# Patient Record
Sex: Female | Born: 1965 | Race: White | Hispanic: No | Marital: Married | Smoking: Never smoker
Health system: Southern US, Community
[De-identification: ages and names within clinical notes are randomized; demographics above are authoritative.]

## PROBLEM LIST (undated history)

## (undated) DIAGNOSIS — R519 Headache, unspecified: Secondary | ICD-10-CM

## (undated) DIAGNOSIS — E119 Type 2 diabetes mellitus without complications: Secondary | ICD-10-CM

## (undated) DIAGNOSIS — Z87898 Personal history of other specified conditions: Secondary | ICD-10-CM

## (undated) DIAGNOSIS — Z9889 Other specified postprocedural states: Secondary | ICD-10-CM

## (undated) DIAGNOSIS — I10 Essential (primary) hypertension: Secondary | ICD-10-CM

## (undated) DIAGNOSIS — K219 Gastro-esophageal reflux disease without esophagitis: Secondary | ICD-10-CM

## (undated) DIAGNOSIS — M1990 Unspecified osteoarthritis, unspecified site: Secondary | ICD-10-CM

## (undated) DIAGNOSIS — Z87442 Personal history of urinary calculi: Secondary | ICD-10-CM

## (undated) DIAGNOSIS — S83206A Unspecified tear of unspecified meniscus, current injury, right knee, initial encounter: Secondary | ICD-10-CM

## (undated) DIAGNOSIS — R112 Nausea with vomiting, unspecified: Secondary | ICD-10-CM

## (undated) HISTORY — PX: TONSILLECTOMY: SHX5217

## (undated) HISTORY — DX: Type 2 diabetes mellitus without complications: E11.9

## (undated) HISTORY — PX: OTHER SURGICAL HISTORY: SHX169

## (undated) HISTORY — DX: Personal history of other specified conditions: Z87.898

## (undated) HISTORY — DX: Essential (primary) hypertension: I10

## (undated) HISTORY — DX: Gastro-esophageal reflux disease without esophagitis: K21.9

## (undated) HISTORY — PX: CARPECTOMY: SHX5004

---

## 1997-07-27 HISTORY — PX: GANGLION CYST EXCISION: SHX1691

## 1998-07-27 HISTORY — PX: KNEE ARTHROSCOPY: SUR90

## 2002-04-22 ENCOUNTER — Inpatient Hospital Stay (HOSPITAL_COMMUNITY): Admission: AD | Admit: 2002-04-22 | Discharge: 2002-04-22 | Payer: Self-pay | Admitting: Gynecology

## 2002-07-03 ENCOUNTER — Ambulatory Visit (HOSPITAL_COMMUNITY): Admission: RE | Admit: 2002-07-03 | Discharge: 2002-07-03 | Payer: Self-pay | Admitting: Gastroenterology

## 2002-08-24 ENCOUNTER — Other Ambulatory Visit: Admission: RE | Admit: 2002-08-24 | Discharge: 2002-08-24 | Payer: Self-pay | Admitting: Obstetrics and Gynecology

## 2002-10-10 ENCOUNTER — Ambulatory Visit (HOSPITAL_COMMUNITY): Admission: RE | Admit: 2002-10-10 | Discharge: 2002-10-10 | Payer: Self-pay | Admitting: Obstetrics and Gynecology

## 2002-10-10 ENCOUNTER — Encounter: Payer: Self-pay | Admitting: Obstetrics and Gynecology

## 2003-02-15 ENCOUNTER — Inpatient Hospital Stay (HOSPITAL_COMMUNITY): Admission: AD | Admit: 2003-02-15 | Discharge: 2003-02-18 | Payer: Self-pay | Admitting: Obstetrics and Gynecology

## 2003-02-19 ENCOUNTER — Encounter: Admission: RE | Admit: 2003-02-19 | Discharge: 2003-03-21 | Payer: Self-pay | Admitting: Obstetrics and Gynecology

## 2003-03-22 ENCOUNTER — Encounter: Admission: RE | Admit: 2003-03-22 | Discharge: 2003-04-21 | Payer: Self-pay | Admitting: Obstetrics and Gynecology

## 2003-04-22 ENCOUNTER — Encounter: Admission: RE | Admit: 2003-04-22 | Discharge: 2003-05-22 | Payer: Self-pay | Admitting: Obstetrics and Gynecology

## 2003-08-27 ENCOUNTER — Other Ambulatory Visit: Admission: RE | Admit: 2003-08-27 | Discharge: 2003-08-27 | Payer: Self-pay | Admitting: Obstetrics and Gynecology

## 2004-07-27 HISTORY — PX: BREAST REDUCTION SURGERY: SHX8

## 2004-09-04 ENCOUNTER — Other Ambulatory Visit: Admission: RE | Admit: 2004-09-04 | Discharge: 2004-09-04 | Payer: Self-pay | Admitting: Obstetrics and Gynecology

## 2004-12-31 ENCOUNTER — Encounter: Admission: RE | Admit: 2004-12-31 | Discharge: 2004-12-31 | Payer: Self-pay | Admitting: Obstetrics and Gynecology

## 2005-07-27 HISTORY — PX: CARPECTOMY: SHX5004

## 2005-09-09 ENCOUNTER — Other Ambulatory Visit: Admission: RE | Admit: 2005-09-09 | Discharge: 2005-09-09 | Payer: Self-pay | Admitting: Obstetrics and Gynecology

## 2007-03-30 ENCOUNTER — Encounter: Admission: RE | Admit: 2007-03-30 | Discharge: 2007-03-30 | Payer: Self-pay | Admitting: Unknown Physician Specialty

## 2007-04-06 ENCOUNTER — Encounter: Admission: RE | Admit: 2007-04-06 | Discharge: 2007-04-06 | Payer: Self-pay | Admitting: Unknown Physician Specialty

## 2007-10-12 ENCOUNTER — Encounter: Admission: RE | Admit: 2007-10-12 | Discharge: 2007-10-12 | Payer: Self-pay | Admitting: Family Medicine

## 2008-05-14 ENCOUNTER — Encounter: Admission: RE | Admit: 2008-05-14 | Discharge: 2008-05-14 | Payer: Self-pay | Admitting: Family Medicine

## 2009-05-20 ENCOUNTER — Encounter: Admission: RE | Admit: 2009-05-20 | Discharge: 2009-05-20 | Payer: Self-pay | Admitting: Family Medicine

## 2010-08-18 ENCOUNTER — Encounter: Payer: Self-pay | Admitting: Unknown Physician Specialty

## 2010-08-18 ENCOUNTER — Encounter: Payer: Self-pay | Admitting: Family Medicine

## 2010-12-12 NOTE — Op Note (Signed)
Jamie Hall, Jamie Hall                          ACCOUNT NO.:  [id number]   MEDICAL RECORD NO.:  [id number]                   PATIENT TYPE:  INP   LOCATION:  9144                                 FACILITY:  [facility]   PHYSICIAN:  Huel Cote, M.D.              DATE OF BIRTH:  [birth year]   DATE OF PROCEDURE:  02/16/2003  DATE OF DISCHARGE:                                 OPERATIVE REPORT   DELIVERY NOTE:  In short, this is a 45 year old female who was admitted with  premature rupture of membranes on February 15, 2003.  She was then observed for  several hours with no spontaneous labor noted at 36-5/7 weeks' gestation.  She was begun on IV Pitocin to stimulate labor and progressed well  throughout the day, reaching complete dilation at approximately 6 p.m.  The  patient began pushing at 6 p.m. and pushed for 2-1/2 hours with good  progress of fetal station from a 0 to a +3 station.  After 2-1/2 hours of  pushing, the vertex was visible at the introitus, approximately a quarter-  size portion of the head was visible without pushing.  Secondary to maternal  exhaustion, the decision was made to proceed with an outlet vacuum delivery  after risks and benefits were discussed with parents.  The vertex delivered  direct OA with one easy pull to the level of the ears and at that point, the  remainder of the head was delivered with a Ritgen maneuver and a turtle sign  was noted immediately.  A nuchal cord x1 was palpated and reduced over the  head, and an additional nurse was called to the room for assistance.  The  McRoberts maneuver was employed and a wood screw and suprapubic pressure.  Attempted to deliver the anterior shoulder with no success.  After  approximately three minutes, the posterior arm was successfully delivered  and the remainder of the female infant followed.  Pediatrics was called to  the room to evaluate the baby, given the shoulder dystocia, and then the  baby appeared floppy  after delivery.  There was initially an excellent heart  rate with the baby, and it responded to bagging with oxygen with Apgars 3,  8, and 9.  Weight was 6 pounds 15 ounces, cord pH was 7.22.  The placenta  delivered spontaneously.  A second degree perineal laceration was repaired  with 2-0 and 3-0 Vicryl with the sphincter also reinforced.  Estimated blood  loss was 450 mL.  At the conclusion of the delivery the baby was breathing  on its own, doing well, and was taken to the observation nursery.  Still had  some decreased tone, so it was not apparent how well the extremities were  moving; however, these were going to be observed closely in the nursery.  Huel Cote, M.D.    KR/MEDQ  D:  02/16/2003  T:  02/18/2003  Job:  [id number]

## 2010-12-12 NOTE — Discharge Summary (Signed)
Jamie Hall, Jamie Hall                            ACCOUNT NO.:  [id number]   MEDICAL RECORD NO.:  [id number]                   PATIENT TYPE:   LOCATION:                                       FACILITY:   PHYSICIAN:  Huel Cote, M.D.              DATE OF BIRTH:   DATE OF ADMISSION:  02/16/2003  DATE OF DISCHARGE:  02/18/2003                                 DISCHARGE SUMMARY   DISCHARGE DIAGNOSES:  1. Term pregnancy at 37 weeks, delivered.  2. Status post vaginal delivery with severe shoulder dystocia.  3. Advanced maternal age.  4. Group B strep carrier.   DISCHARGE MEDICATIONS:  1. Motrin 600 mg p.o. every six hours p.r.n..  2. Percocet 1-2 tablets p.o. every four hours as needed.   DISCHARGE FOLLOWUP:  The patient is to followup in our office in  approximately six weeks for her routine postpartum exam.   HOSPITAL COURSE:  The patient is a 45 year old G3, P0-0-2-0 who was admitted  with spontaneous ruptured membranes on February 16, 2003.  At that time the  patient had no significant contractions.  Prenatal care had been essentially  uncomplicated except for advanced maternal age with the patient declining  amniocentesis and with a history of polycystic ovary disease.   PRENATAL LABS:  A positive antibody negative, RPR nonreactive, Rubella  immune, hepatitis B surface antigen negative, HIV declined, gonorrhea  negative, Chlamydia negative, Group B strep positive, triple screen normal.   PAST OBSTETRICAL HISTORY:  In 2002, she had a miscarriage and in 2003 had a  miscarriage as well.   PAST GYNECOLOGICAL HISTORY:  There is a history of polycystic ovary disease  and some infertility related to that.   PAST MEDICAL HISTORY:  None.   PAST SURGICAL HISTORY:  1. The patient had a tonsillectomy in 1974.  2. In 1999, she had a right ganglion cyst removed.  3. In 2000, she had a right arthroscopy.  4. In 2003, she had a D&E at time of her miscarriage.  5. In 1985, she had a  right knee biopsy.   DRUG ALLERGIES:  PENICILLIN.   MEDICATIONS:  She was on no medications at the time of admission.   PHYSICAL EXAMINATION:  VITAL SIGNS:  She was afebrile with stable vital  signs.  Blood pressure is 105/56.  PELVIC:  Cervix was 1-2-cm, 70% effaced and vertex was at a -1.   HOSPITAL COURSE:  She was begun on Pitocin throughout the day and reached  complete dilation some time that evening.  After reaching complete dilation,  the patient pushed approximately 1-1/2 hours and brought the vertex to the  perineum with a quarter sized portion of the caput showing at the vaginal  area.  Secondary to maternal exhaustion, the decision was made to proceed  with an outlet vacuum delivery, and the vertex was delivered direct OA with  one easy pull up to  the ears and the remainder of the head delivered with a  positive turtle sign noted immediately.  There was a nuchal cord x1 which  reduced.  Additional nursing was called to the room and the baby was  ultimately delivered with McRoberts-Wood screw, suprapubic pressure and a  posterior arm release.  A viable female infant was delivered.  Pediatrics  was called to the room. and the baby responded well.  Apgars were 3, 8 and  9.  Weight was 6 pounds 15 ounces.  Cord pH was 7.22. A second degree  perineal laceration was repaired with 2-0 and 3-0 Vicryl.  Estimated blood  loss was approximately 450 cc.  Baby did well except for some decreased  movement of the right anterior arm which persisted until discharge, although  was improving slowly.  The patient did very well after delivery and remained  afebrile.  On postpartum day number two, she was tolerating her pain well,  had no complaints and the fundus was firm; therefore, she was felt stable  for discharge home and was discharged with medications and followup as  previously stated.                                               Huel Cote, M.D.    KR/MEDQ  D:  03/06/2003   T:  03/06/2003  Job:  [id number]

## 2011-12-30 ENCOUNTER — Other Ambulatory Visit: Payer: Self-pay | Admitting: Obstetrics and Gynecology

## 2011-12-30 DIAGNOSIS — Z1231 Encounter for screening mammogram for malignant neoplasm of breast: Secondary | ICD-10-CM

## 2012-02-04 ENCOUNTER — Ambulatory Visit: Payer: Self-pay

## 2012-02-08 ENCOUNTER — Ambulatory Visit
Admission: RE | Admit: 2012-02-08 | Discharge: 2012-02-08 | Disposition: A | Discharge status: Home / Self Care | Payer: No Typology Code available for payment source | Source: Ambulatory Visit | Attending: Obstetrics and Gynecology | Admitting: Obstetrics and Gynecology

## 2012-02-08 ENCOUNTER — Other Ambulatory Visit: Payer: Self-pay | Admitting: Obstetrics and Gynecology

## 2012-02-08 DIAGNOSIS — Z1231 Encounter for screening mammogram for malignant neoplasm of breast: Secondary | ICD-10-CM

## 2012-02-18 DIAGNOSIS — C4491 Basal cell carcinoma of skin, unspecified: Secondary | ICD-10-CM | POA: Insufficient documentation

## 2015-08-26 DIAGNOSIS — H5213 Myopia, bilateral: Secondary | ICD-10-CM | POA: Diagnosis not present

## 2015-09-06 DIAGNOSIS — R3121 Asymptomatic microscopic hematuria: Secondary | ICD-10-CM | POA: Diagnosis not present

## 2015-09-06 DIAGNOSIS — Z0000 Encounter for general adult medical examination without abnormal findings: Secondary | ICD-10-CM | POA: Diagnosis not present

## 2015-10-11 ENCOUNTER — Other Ambulatory Visit (HOSPITAL_COMMUNITY): Payer: Self-pay | Admitting: Urology

## 2015-10-11 DIAGNOSIS — R3121 Asymptomatic microscopic hematuria: Secondary | ICD-10-CM

## 2015-10-15 DIAGNOSIS — J111 Influenza due to unidentified influenza virus with other respiratory manifestations: Secondary | ICD-10-CM | POA: Diagnosis not present

## 2015-10-16 ENCOUNTER — Ambulatory Visit (HOSPITAL_COMMUNITY): Payer: 59

## 2015-10-23 ENCOUNTER — Ambulatory Visit (HOSPITAL_COMMUNITY)
Admission: RE | Admit: 2015-10-23 | Discharge: 2015-10-23 | Disposition: A | Discharge status: Home / Self Care | Payer: 59 | Source: Ambulatory Visit | Attending: Urology | Admitting: Urology

## 2015-10-23 ENCOUNTER — Encounter (HOSPITAL_COMMUNITY): Payer: Self-pay

## 2015-10-23 DIAGNOSIS — N200 Calculus of kidney: Secondary | ICD-10-CM | POA: Insufficient documentation

## 2015-10-23 DIAGNOSIS — N281 Cyst of kidney, acquired: Secondary | ICD-10-CM | POA: Diagnosis not present

## 2015-10-23 DIAGNOSIS — R3121 Asymptomatic microscopic hematuria: Secondary | ICD-10-CM | POA: Diagnosis not present

## 2015-10-23 MED ORDER — IOPAMIDOL (ISOVUE-300) INJECTION 61%
100.0000 mL | Freq: Once | INTRAVENOUS | Status: AC | PRN
  Administered 2015-10-23: 100 mL via INTRAVENOUS

## 2016-06-25 DIAGNOSIS — Z1231 Encounter for screening mammogram for malignant neoplasm of breast: Secondary | ICD-10-CM | POA: Diagnosis not present

## 2016-06-25 DIAGNOSIS — Z1389 Encounter for screening for other disorder: Secondary | ICD-10-CM | POA: Diagnosis not present

## 2016-06-25 DIAGNOSIS — Z975 Presence of (intrauterine) contraceptive device: Secondary | ICD-10-CM | POA: Diagnosis not present

## 2016-06-25 DIAGNOSIS — Z01419 Encounter for gynecological examination (general) (routine) without abnormal findings: Secondary | ICD-10-CM | POA: Diagnosis not present

## 2016-06-25 DIAGNOSIS — Z130 Encounter for screening for diseases of the blood and blood-forming organs and certain disorders involving the immune mechanism: Secondary | ICD-10-CM | POA: Diagnosis not present

## 2016-06-25 DIAGNOSIS — Z6841 Body Mass Index (BMI) 40.0 and over, adult: Secondary | ICD-10-CM | POA: Diagnosis not present

## 2017-06-19 LAB — HM MAMMOGRAPHY: HM Mammogram: NORMAL (ref 0–4)

## 2017-06-19 LAB — HM PAP SMEAR

## 2017-07-08 DIAGNOSIS — Z130 Encounter for screening for diseases of the blood and blood-forming organs and certain disorders involving the immune mechanism: Secondary | ICD-10-CM | POA: Diagnosis not present

## 2017-07-08 DIAGNOSIS — Z975 Presence of (intrauterine) contraceptive device: Secondary | ICD-10-CM | POA: Diagnosis not present

## 2017-07-08 DIAGNOSIS — Z124 Encounter for screening for malignant neoplasm of cervix: Secondary | ICD-10-CM | POA: Diagnosis not present

## 2017-07-08 DIAGNOSIS — Z1151 Encounter for screening for human papillomavirus (HPV): Secondary | ICD-10-CM | POA: Diagnosis not present

## 2017-07-08 DIAGNOSIS — Z1231 Encounter for screening mammogram for malignant neoplasm of breast: Secondary | ICD-10-CM | POA: Diagnosis not present

## 2017-07-08 DIAGNOSIS — Z1389 Encounter for screening for other disorder: Secondary | ICD-10-CM | POA: Diagnosis not present

## 2017-07-08 DIAGNOSIS — Z01419 Encounter for gynecological examination (general) (routine) without abnormal findings: Secondary | ICD-10-CM | POA: Diagnosis not present

## 2017-07-09 DIAGNOSIS — Z124 Encounter for screening for malignant neoplasm of cervix: Secondary | ICD-10-CM | POA: Diagnosis not present

## 2017-11-03 DIAGNOSIS — G8929 Other chronic pain: Secondary | ICD-10-CM | POA: Diagnosis not present

## 2017-11-03 DIAGNOSIS — L02232 Carbuncle of back [any part, except buttock]: Secondary | ICD-10-CM | POA: Diagnosis not present

## 2017-11-04 DIAGNOSIS — G8929 Other chronic pain: Secondary | ICD-10-CM | POA: Diagnosis not present

## 2017-11-04 DIAGNOSIS — L02232 Carbuncle of back [any part, except buttock]: Secondary | ICD-10-CM | POA: Diagnosis not present

## 2017-12-15 ENCOUNTER — Telehealth: Payer: Self-pay | Admitting: *Deleted

## 2017-12-15 NOTE — Telephone Encounter (Signed)
Patient has been scheduled for colonoscopy on 02/01/18 at 10:00 am in [facility] She is also scheduled for previsit on 01/19/18 at 3:30 pm. Patient verbalizes understanding of this and has been sent paperwork with these dates and times.

## 2017-12-15 NOTE — Telephone Encounter (Signed)
-----   Message from Jerene Bears, MD sent at 12/14/2017 10:47 AM EDT ----- Regarding: Screening colon [place] needs her screening colon after 01/06/2018; avg risk Please contact her to arrange in [facility] Thanks [place]

## 2018-01-17 ENCOUNTER — Telehealth: Payer: Self-pay | Admitting: Family Medicine

## 2018-01-17 ENCOUNTER — Encounter: Payer: Self-pay | Admitting: Family Medicine

## 2018-01-17 ENCOUNTER — Ambulatory Visit (INDEPENDENT_AMBULATORY_CARE_PROVIDER_SITE_OTHER): Payer: 59 | Admitting: Family Medicine

## 2018-01-17 ENCOUNTER — Other Ambulatory Visit: Payer: Self-pay

## 2018-01-17 DIAGNOSIS — G44009 Cluster headache syndrome, unspecified, not intractable: Secondary | ICD-10-CM | POA: Insufficient documentation

## 2018-01-17 DIAGNOSIS — I10 Essential (primary) hypertension: Secondary | ICD-10-CM | POA: Diagnosis not present

## 2018-01-17 DIAGNOSIS — E119 Type 2 diabetes mellitus without complications: Secondary | ICD-10-CM | POA: Insufficient documentation

## 2018-01-17 DIAGNOSIS — R7303 Prediabetes: Secondary | ICD-10-CM | POA: Diagnosis not present

## 2018-01-17 DIAGNOSIS — G44019 Episodic cluster headache, not intractable: Secondary | ICD-10-CM | POA: Diagnosis not present

## 2018-01-17 LAB — HEPATIC FUNCTION PANEL
ALT: 24 U/L (ref 0–35)
AST: 16 U/L (ref 0–37)
Albumin: 4.1 g/dL (ref 3.5–5.2)
Alkaline Phosphatase: 66 U/L (ref 39–117)
Bilirubin, Direct: 0.1 mg/dL (ref 0.0–0.3)
TOTAL PROTEIN: 6.7 g/dL (ref 6.0–8.3)
Total Bilirubin: 0.2 mg/dL (ref 0.2–1.2)

## 2018-01-17 LAB — LIPID PANEL
CHOLESTEROL: 184 mg/dL (ref 0–200)
HDL: 43.2 mg/dL (ref >39.00)
LDL Cholesterol: 115 mg/dL — ABNORMAL HIGH (ref 0–99)
NonHDL: 140.89
Total CHOL/HDL Ratio: 4
Triglycerides: 129 mg/dL (ref 0.0–149.0)
VLDL: 25.8 mg/dL (ref 0.0–40.0)

## 2018-01-17 LAB — CBC WITH DIFFERENTIAL/PLATELET
Basophils Absolute: 0.1 10*3/uL (ref 0.0–0.1)
Basophils Relative: 0.7 % (ref 0.0–3.0)
EOS ABS: 0.3 10*3/uL (ref 0.0–0.7)
Eosinophils Relative: 2 % (ref 0.0–5.0)
HEMATOCRIT: 38.8 % (ref 36.0–46.0)
Hemoglobin: 12.9 g/dL (ref 12.0–15.0)
LYMPHS PCT: 21.1 % (ref 12.0–46.0)
Lymphs Abs: 2.7 10*3/uL (ref 0.7–4.0)
MCHC: 33.3 g/dL (ref 30.0–36.0)
MCV: 86.1 fl (ref 78.0–100.0)
Monocytes Absolute: 1 10*3/uL (ref 0.1–1.0)
Monocytes Relative: 7.9 % (ref 3.0–12.0)
Neutro Abs: 8.6 10*3/uL — ABNORMAL HIGH (ref 1.4–7.7)
Neutrophils Relative %: 68.3 % (ref 43.0–77.0)
Platelets: 361 10*3/uL (ref 150.0–400.0)
RBC: 4.5 Mil/uL (ref 3.87–5.11)
RDW: 15 % (ref 11.5–15.5)
WBC: 12.6 10*3/uL — ABNORMAL HIGH (ref 4.0–10.5)

## 2018-01-17 LAB — BASIC METABOLIC PANEL
BUN: 11 mg/dL (ref 6–23)
CALCIUM: 9.6 mg/dL (ref 8.4–10.5)
CO2: 31 meq/L (ref 19–32)
CREATININE: 0.8 mg/dL (ref 0.40–1.20)
Chloride: 102 mEq/L (ref 96–112)
GFR: 80.69 mL/min (ref >60.00)
Glucose, Bld: 95 mg/dL (ref 70–99)
Potassium: 3.9 mEq/L (ref 3.5–5.1)
Sodium: 140 mEq/L (ref 135–145)

## 2018-01-17 LAB — TSH: TSH: 1.74 u[IU]/mL (ref 0.35–4.50)

## 2018-01-17 LAB — HEMOGLOBIN A1C: HEMOGLOBIN A1C: 7 % — AB (ref 4.6–6.5)

## 2018-01-17 MED ORDER — SCOPOLAMINE 1 MG/3DAYS TD PT72: 1.0000 | MEDICATED_PATCH | TRANSDERMAL | 0 refills | Status: DC

## 2018-01-17 MED ORDER — LISINOPRIL 10 MG PO TABS: 10.0000 mg | ORAL_TABLET | Freq: Every day | ORAL | 3 refills | Status: DC

## 2018-01-17 MED ORDER — NAFTIFINE HCL 2 % EX CREA: TOPICAL_CREAM | CUTANEOUS | 3 refills | Status: DC

## 2018-01-17 MED ORDER — OMEPRAZOLE 20 MG PO CPDR: 20.0000 mg | DELAYED_RELEASE_CAPSULE | Freq: Every day | ORAL | 1 refills | Status: DC

## 2018-01-17 MED ORDER — TRIAMCINOLONE ACETONIDE 0.1 % EX OINT: 1.0000 "application " | TOPICAL_OINTMENT | Freq: Two times a day (BID) | CUTANEOUS | 1 refills | Status: DC

## 2018-01-17 MED FILL — LISINOPRIL 10 MG TABLET: 10 | 90 days supply | Qty: 90 | Fill #0

## 2018-01-17 MED FILL — TRIAMCINOLONE 0.1% OINTMENT: 0.1 | 20 days supply | Qty: 90 | Fill #0

## 2018-01-17 MED FILL — TRANSDERM-SCOP 1.5 MG/72HR: 1 | 30 days supply | Qty: 10 | Fill #0

## 2018-01-17 MED FILL — OMEPRAZOLE 20 MG CAP: 20 | 90 days supply | Qty: 90 | Fill #0

## 2018-01-17 NOTE — Telephone Encounter (Signed)
Ok to send script to [facility] pharmacy

## 2018-01-17 NOTE — Assessment & Plan Note (Signed)
New to provider, ongoing for pt for ~15 yrs.  Discussed need to lower BP and while she works on [employer] and regular exercise, we can accomplish this by restarting the Lisinopril that she took a few years ago.  Pt expressed understanding and is in agreement w/ plan.

## 2018-01-17 NOTE — Assessment & Plan Note (Signed)
New to provider, ongoing for pt.  She is not interested in seeing Neurology at this time.  Will follow.

## 2018-01-17 NOTE — Telephone Encounter (Signed)
Medication filled to pharmacy as requested.   

## 2018-01-17 NOTE — Patient Instructions (Signed)
Follow up in 4-6 weeks to recheck BP We'll notify you of your lab results and make any changes if needed RESTART the Lisinopril once daily Try and limit your carbohydrates and processed foods Set a goal to be active for 30 minutes each day Call with any questions or concerns Welcome!  We're glad to have you!

## 2018-01-17 NOTE — Progress Notes (Signed)
   Subjective:    Patient ID: Jamie Hall, female    DOB: 1965/09/03, 52 y.o.   MRN: [id number]  HPI New to establish.  Previous MD- [facility]  OB/GYN- [facility]  HTN- ongoing issue.  Pt reports it has been borderline 'for 15 yrs'.  Was started on Lisinopril some years ago but never got this refilled.  No side effects at that time.  No CP, SOB.  + HAs.  No visual changes.  No abd pain, N/V, edema.  Pre-diabetes- pt reports she had A1C of 6.1 'several years ago'.  Gets yearly eye exams (has contacts).  Obesity- pt's BMI is 41.7.  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  No particular diet- 'convenience'.  Prefers to snack all day rather than eat meals.  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  No relief w/ ibuprofen.  sxs tend to resolve on their own- 'I wait it out'.  Pt will develop nasal congestion during the first hr of headaches.  Pt doesn't feel that it happens enough to see Neuro- occurring q6 weeks but will be triggered by cruises.   Review of Systems For ROS see HPI     Objective:   Physical Exam  Constitutional: She is oriented to person, place, and time. She appears well-developed and well-nourished. No distress.  obese  HENT:  Head: Normocephalic and atraumatic.  Eyes: Pupils are equal, round, and reactive to light. Conjunctivae and EOM are normal.  Neck: Normal range of motion. Neck supple. No thyromegaly present.  Cardiovascular: Normal rate, regular rhythm, normal heart sounds and intact distal pulses.  No murmur heard. Pulmonary/Chest: Effort normal and breath sounds normal. No respiratory distress.  Abdominal: Soft. She exhibits no distension. There is no tenderness.  Musculoskeletal: She exhibits no edema.  Lymphadenopathy:    She has no cervical adenopathy.  Neurological: She is alert and oriented to person, place, and time.  Skin: Skin is warm and dry.  Psychiatric: She has a  normal mood and affect. Her behavior is normal.  Vitals reviewed.         Assessment & Plan:

## 2018-01-17 NOTE — Telephone Encounter (Signed)
Ok to send in prilosec?

## 2018-01-17 NOTE — Assessment & Plan Note (Signed)
Pt's BMI is 41.7- falling in the morbidly obese range.  Stressed need for healthy diet and regular exercise.  Check labs to risk stratify.  Will follow.

## 2018-01-17 NOTE — Assessment & Plan Note (Signed)
Pt has hx of elevated A1C (6.1) a few years ago.  She admits to poor dietary habits and little exercise since then.  I stressed the need for both.  Check labs to risk stratify and adjust tx plan prn.  Pt expressed understanding and is in agreement w/ plan.

## 2018-01-17 NOTE — Addendum Note (Signed)
Addended by: Davis Gourd on: 01/17/2018 12:11 PM   Modules accepted: Orders

## 2018-01-17 NOTE — Telephone Encounter (Signed)
Pt states she would also like prilosec called into [facility] as well.

## 2018-01-18 ENCOUNTER — Other Ambulatory Visit: Payer: Self-pay | Admitting: General Practice

## 2018-01-18 DIAGNOSIS — D72829 Elevated white blood cell count, unspecified: Secondary | ICD-10-CM

## 2018-01-18 MED ORDER — METFORMIN HCL 500 MG PO TABS: 500.0000 mg | ORAL_TABLET | Freq: Two times a day (BID) | ORAL | 3 refills | Status: DC

## 2018-01-18 MED FILL — metFORMIN HCL 500 MG TABS: 500 | 30 days supply | Qty: 60 | Fill #0

## 2018-01-19 ENCOUNTER — Ambulatory Visit (AMBULATORY_SURGERY_CENTER): Payer: Self-pay

## 2018-01-19 ENCOUNTER — Other Ambulatory Visit: Payer: Self-pay

## 2018-01-19 VITALS — Ht 63.0 in | Wt 241.8 lb

## 2018-01-19 DIAGNOSIS — Z1211 Encounter for screening for malignant neoplasm of colon: Secondary | ICD-10-CM

## 2018-01-19 MED ORDER — NA SULFATE-K SULFATE-MG SULF 17.5-3.13-1.6 GM/177ML PO SOLN: 1.0000 | Freq: Once | ORAL | 0 refills | Status: AC

## 2018-01-19 MED FILL — SUPREP BOWEL PREP KIT: 17.5-3.13-1 | 2 days supply | Qty: 354 | Fill #0

## 2018-01-19 NOTE — Progress Notes (Signed)
Denies allergies to eggs or soy products. Denies complication of anesthesia or sedation. Denies use of weight loss medication. Denies use of O2.   Emmi instructions declined.  

## 2018-01-21 ENCOUNTER — Encounter: Payer: Self-pay | Admitting: Internal Medicine

## 2018-02-01 ENCOUNTER — Encounter: Payer: Self-pay | Admitting: Internal Medicine

## 2018-02-01 ENCOUNTER — Ambulatory Visit (AMBULATORY_SURGERY_CENTER): Payer: 59 | Admitting: Internal Medicine

## 2018-02-01 VITALS — BP 154/85 | HR 75 | Temp 98.2°F | Resp 19 | Ht 63.0 in | Wt 237.0 lb

## 2018-02-01 DIAGNOSIS — Z1211 Encounter for screening for malignant neoplasm of colon: Secondary | ICD-10-CM | POA: Diagnosis present

## 2018-02-01 DIAGNOSIS — D123 Benign neoplasm of transverse colon: Secondary | ICD-10-CM | POA: Diagnosis not present

## 2018-02-01 DIAGNOSIS — I10 Essential (primary) hypertension: Secondary | ICD-10-CM | POA: Diagnosis not present

## 2018-02-01 DIAGNOSIS — K635 Polyp of colon: Secondary | ICD-10-CM

## 2018-02-01 DIAGNOSIS — D122 Benign neoplasm of ascending colon: Secondary | ICD-10-CM

## 2018-02-01 DIAGNOSIS — E119 Type 2 diabetes mellitus without complications: Secondary | ICD-10-CM | POA: Diagnosis not present

## 2018-02-01 DIAGNOSIS — K219 Gastro-esophageal reflux disease without esophagitis: Secondary | ICD-10-CM | POA: Diagnosis not present

## 2018-02-01 MED ORDER — SODIUM CHLORIDE 0.9 % IV SOLN: 500.0000 mL | Freq: Once | INTRAVENOUS | Status: DC

## 2018-02-01 NOTE — Patient Instructions (Signed)
YOU HAD AN ENDOSCOPIC PROCEDURE TODAY AT [facility]:   Refer to the procedure report that was given to you for any specific questions about what was found during the examination.  If the procedure report does not answer your questions, please call your gastroenterologist to clarify.  If you requested that your care partner not be given the details of your procedure findings, then the procedure report has been included in a sealed envelope for you to review at your convenience later.  YOU SHOULD EXPECT: Some feelings of bloating in the abdomen. Passage of more gas than usual.  Walking can help get rid of the air that was put into your GI tract during the procedure and reduce the bloating. If you had a lower endoscopy (such as a colonoscopy or flexible sigmoidoscopy) you may notice spotting of blood in your stool or on the toilet paper. If you underwent a bowel prep for your procedure, you may not have a normal bowel movement for a few days.  Please Note:  You might notice some irritation and congestion in your nose or some drainage.  This is from the oxygen used during your procedure.  There is no need for concern and it should clear up in a day or so.  SYMPTOMS TO REPORT IMMEDIATELY:   Following lower endoscopy (colonoscopy or flexible sigmoidoscopy):  Excessive amounts of blood in the stool  Significant tenderness or worsening of abdominal pains  Swelling of the abdomen that is new, acute  Fever of 100F or higher   For urgent or emergent issues, a gastroenterologist can be reached at any hour by calling [phone].   DIET:  We do recommend a small meal at first, but then you may proceed to your regular diet.  Drink plenty of fluids but you should avoid alcoholic beverages for 24 hours.  ACTIVITY:  You should plan to take it easy for the rest of today and you should NOT DRIVE or use heavy machinery until tomorrow (because of the sedation medicines used during the test).     FOLLOW UP: Our staff will call the number listed on your records the next business day following your procedure to check on you and address any questions or concerns that you may have regarding the information given to you following your procedure. If we do not reach you, we will leave a message.  However, if you are feeling well and you are not experiencing any problems, there is no need to return our call.  We will assume that you have returned to your regular daily activities without incident.  If any biopsies were taken you will be contacted by phone or by letter within the next 1-3 weeks.  Please call us at [phone] if you have not heard about the biopsies in 3 weeks.    SIGNATURES/CONFIDENTIALITY: You and/or your care partner have signed paperwork which will be entered into your electronic medical record.  These signatures attest to the fact that that the information above on your After Visit Summary has been reviewed and is understood.  Full responsibility of the confidentiality of this discharge information lies with you and/or your care-partner.    Handouts were given to your care partner on polyps and diverticulosis. You may resume your current medications today. Await biopsy results. Your blood sugar was 125 in the recovery room. Please call if any questions or concerns.

## 2018-02-01 NOTE — Progress Notes (Signed)
Called to room to assist during endoscopic procedure.  Patient ID and intended procedure confirmed with present staff. Received instructions for my participation in the procedure from the performing physician.  

## 2018-02-01 NOTE — Progress Notes (Signed)
No problems noted in the recovery room. maw 

## 2018-02-01 NOTE — Progress Notes (Signed)
I have reviewed the patient's medical history in detail and updated the computerized patient record.

## 2018-02-01 NOTE — Op Note (Signed)
[place] Patient Name: Jamie Hall Procedure Date: 02/01/2018 9:56 AM MRN: [id number] Endoscopist: Jerene Bears , MD Age: 52 Referring MD:  Date of Birth: 06/07/1966 Gender: Female Account #: [id number] Procedure:                Colonoscopy Indications:              Screening for colorectal malignant neoplasm, This                            is the patient's first colonoscopy Medicines:                Monitored Anesthesia Care Procedure:                Pre-Anesthesia Assessment:                           - Prior to the procedure, a History and Physical                            was performed, and patient medications and                            allergies were reviewed. The patient's tolerance of                            previous anesthesia was also reviewed. The risks                            and benefits of the procedure and the sedation                            options and risks were discussed with the patient.                            All questions were answered, and informed consent                            was obtained. Prior Anticoagulants: The patient has                            taken no previous anticoagulant or antiplatelet                            agents. ASA Grade Assessment: II - A patient with                            mild systemic disease. After reviewing the risks                            and benefits, the patient was deemed in                            satisfactory condition to undergo the procedure.  After obtaining informed consent, the colonoscope                            was passed under direct vision. Throughout the                            procedure, the patient's blood pressure, pulse, and                            oxygen saturations were monitored continuously. The                            Colonoscope was introduced through the anus and                            advanced to the terminal ileum.  The colonoscopy was                            performed without difficulty. The patient tolerated                            the procedure well. The quality of the bowel                            preparation was good. The terminal ileum, ileocecal                            valve, appendiceal orifice, and rectum were                            photographed. Scope In: 10:09:56 AM Scope Out: 10:29:10 AM Scope Withdrawal Time: 0 hours 15 minutes 59 seconds  Total Procedure Duration: 0 hours 19 minutes 14 seconds  Findings:                 The digital rectal exam was normal.                           The terminal ileum appeared normal.                           A 5 mm polyp was found in the ascending colon. The                            polyp was sessile. The polyp was removed with a                            cold snare. Resection and retrieval were complete.                           Two sessile polyps were found in the hepatic                            flexure. The polyps were 2 to 3 mm in size.  These                            polyps were removed with a cold snare. Resection                            and retrieval were complete.                           A 2 mm polyp was found in the transverse colon. The                            polyp was sessile. The polyp was removed with a                            cold snare. Resection and retrieval were complete.                           A single small angioectasia without bleeding was                            found in the transverse colon.                           A few small-mouthed diverticula were found in the                            sigmoid colon.                           The retroflexed view of the distal rectum and anal                            verge was normal and showed no anal or rectal                            abnormalities. Complications:            No immediate complications. Estimated Blood Loss:     Estimated  blood loss was minimal. Impression:               - The examined portion of the ileum was normal.                           - One 5 mm polyp in the ascending colon, removed                            with a cold snare. Resected and retrieved.                           - Two 2 to 3 mm polyps at the hepatic flexure,                            removed with a cold snare. Resected and retrieved.                           -  One 2 mm polyp in the transverse colon, removed                            with a cold snare. Resected and retrieved.                           - A single non-bleeding colonic angioectasia.                           - Diverticulosis in the sigmoid colon.                           - The distal rectum and anal verge are normal on                            retroflexion view. Recommendation:           - Patient has a contact number available for                            emergencies. The signs and symptoms of potential                            delayed complications were discussed with the                            patient. Return to normal activities tomorrow.                            Written discharge instructions were provided to the                            patient.                           - Resume previous diet.                           - Continue present medications.                           - Await pathology results.                           - Repeat colonoscopy is recommended. The                            colonoscopy date will be determined after pathology                            results from today's exam become available for                            review. Jerene Bears, MD 02/01/2018 10:34:35 AM This report has been signed electronically.

## 2018-02-01 NOTE — Progress Notes (Signed)
Report to PACU, RN, vss, BBS= Clear.  

## 2018-02-02 ENCOUNTER — Telehealth: Payer: Self-pay

## 2018-02-02 ENCOUNTER — Telehealth: Payer: Self-pay | Admitting: *Deleted

## 2018-02-02 NOTE — Telephone Encounter (Signed)
Left message on answering machine. 

## 2018-02-02 NOTE — Telephone Encounter (Signed)
No answer, second call.  Left message to call if questions or concerns. 

## 2018-02-04 ENCOUNTER — Encounter: Payer: Self-pay | Admitting: Internal Medicine

## 2018-02-28 ENCOUNTER — Ambulatory Visit (INDEPENDENT_AMBULATORY_CARE_PROVIDER_SITE_OTHER): Payer: 59 | Admitting: Family Medicine

## 2018-02-28 ENCOUNTER — Other Ambulatory Visit: Payer: Self-pay

## 2018-02-28 ENCOUNTER — Encounter: Payer: Self-pay | Admitting: Family Medicine

## 2018-02-28 VITALS — BP 128/86 | HR 73 | Temp 98.8°F | Resp 16 | Ht 63.25 in | Wt 236.2 lb

## 2018-02-28 DIAGNOSIS — D72829 Elevated white blood cell count, unspecified: Secondary | ICD-10-CM

## 2018-02-28 DIAGNOSIS — E119 Type 2 diabetes mellitus without complications: Secondary | ICD-10-CM | POA: Diagnosis not present

## 2018-02-28 DIAGNOSIS — I10 Essential (primary) hypertension: Secondary | ICD-10-CM

## 2018-02-28 LAB — CBC WITH DIFFERENTIAL/PLATELET
BASOS PCT: 0.4 % (ref 0.0–3.0)
Basophils Absolute: 0.1 10*3/uL (ref 0.0–0.1)
EOS PCT: 1.9 % (ref 0.0–5.0)
Eosinophils Absolute: 0.2 10*3/uL (ref 0.0–0.7)
HEMATOCRIT: 38.4 % (ref 36.0–46.0)
HEMOGLOBIN: 12.7 g/dL (ref 12.0–15.0)
LYMPHS PCT: 19 % (ref 12.0–46.0)
Lymphs Abs: 2.3 10*3/uL (ref 0.7–4.0)
MCHC: 33.1 g/dL (ref 30.0–36.0)
MCV: 86.9 fl (ref 78.0–100.0)
Monocytes Absolute: 0.8 10*3/uL (ref 0.1–1.0)
Monocytes Relative: 6.5 % (ref 3.0–12.0)
NEUTROS ABS: 8.9 10*3/uL — AB (ref 1.4–7.7)
Neutrophils Relative %: 72.2 % (ref 43.0–77.0)
Platelets: 341 10*3/uL (ref 150.0–400.0)
RBC: 4.42 Mil/uL (ref 3.87–5.11)
RDW: 14.8 % (ref 11.5–15.5)
WBC: 12.3 10*3/uL — AB (ref 4.0–10.5)

## 2018-02-28 LAB — BASIC METABOLIC PANEL
BUN: 12 mg/dL (ref 6–23)
CHLORIDE: 103 meq/L (ref 96–112)
CO2: 31 meq/L (ref 19–32)
Calcium: 9.4 mg/dL (ref 8.4–10.5)
Creatinine, Ser: 0.78 mg/dL (ref 0.40–1.20)
GFR: 83.04 mL/min (ref >60.00)
Glucose, Bld: 120 mg/dL — ABNORMAL HIGH (ref 70–99)
POTASSIUM: 4 meq/L (ref 3.5–5.1)
Sodium: 140 mEq/L (ref 135–145)

## 2018-02-28 NOTE — Assessment & Plan Note (Signed)
Improved today.  Asymptomatic.  Check BMP w/ restarting Lisinopril.  Stressed need for healthy diet and regular exercise.  Will continue to follow.

## 2018-02-28 NOTE — Assessment & Plan Note (Signed)
New dx at last visit.  Tolerating Metformin w/o difficulty.  Due for eye exam.  On ACE for renal protection.  Stressed need for healthy diet and regular exercise.  Foot exam done today.  Check BMP w/ addition of Metformin.

## 2018-02-28 NOTE — Patient Instructions (Signed)
Follow up in 3 months to recheck A1C We'll notify you of your lab results and make any changes if needed SCHEDULE your eye exam at your convenience Continue to work on healthy diet and regular exercise- you can do it!! Call with any questions or concerns Enjoy the rest of your summer!!!

## 2018-02-28 NOTE — Progress Notes (Signed)
   Subjective:    Patient ID: Jamie Hall, female    DOB: 1966/07/20, 52 y.o.   MRN: [id number]  HPI HTN- chronic problem, on Lisinopril 10mg  daily.  This was restarted at last visit.  Adequate control today.  No CP, SOB, HAs, visual changes, edema.  DM- pt's A1C was elevated at 7.0 last visit.  Was started on Metformin 500mg  BID at that time.  Pt reports she is good about taking meds once daily but doesn't often get the 2nd dose.  No abd pain, N/V.  No numbness/tingling of hands/feet.  Due for eye exam.  Elevated WBC- due for repeat labs.  Review of Systems For ROS see HPI     Objective:   Physical Exam  Constitutional: She is oriented to person, place, and time. She appears well-developed and well-nourished. No distress.  obese  HENT:  Head: Normocephalic and atraumatic.  Eyes: Pupils are equal, round, and reactive to light. Conjunctivae and EOM are normal.  Neck: Normal range of motion. Neck supple. No thyromegaly present.  Cardiovascular: Normal rate, regular rhythm, normal heart sounds and intact distal pulses.  No murmur heard. Pulmonary/Chest: Effort normal and breath sounds normal. No respiratory distress.  Abdominal: Soft. She exhibits no distension. There is no tenderness.  Musculoskeletal: She exhibits no edema.  Lymphadenopathy:    She has no cervical adenopathy.  Neurological: She is alert and oriented to person, place, and time.  Skin: Skin is warm and dry.  Psychiatric: She has a normal mood and affect. Her behavior is normal.  Vitals reviewed.         Assessment & Plan:  Elevated WBC- noted at last visit.  No illness noted at that time.  Repeat labs today.

## 2018-03-31 MED FILL — metFORMIN HCL 500 MG TABS: 500 | 30 days supply | Qty: 60 | Fill #1

## 2018-03-31 MED FILL — OMEPRAZOLE 20 MG CPDR: 20 | 90 days supply | Qty: 90 | Fill #1

## 2018-03-31 MED FILL — LISINOPRIL 10 MG TABLET: 10 | 30 days supply | Qty: 30 | Fill #1

## 2018-05-25 DIAGNOSIS — H5213 Myopia, bilateral: Secondary | ICD-10-CM | POA: Diagnosis not present

## 2018-05-25 LAB — HM DIABETES EYE EXAM

## 2018-06-02 ENCOUNTER — Other Ambulatory Visit: Payer: Self-pay

## 2018-06-02 ENCOUNTER — Ambulatory Visit (INDEPENDENT_AMBULATORY_CARE_PROVIDER_SITE_OTHER): Payer: 59 | Admitting: Family Medicine

## 2018-06-02 ENCOUNTER — Encounter: Payer: Self-pay | Admitting: Family Medicine

## 2018-06-02 VITALS — BP 123/81 | HR 78 | Temp 98.0°F | Resp 16 | Ht 63.0 in | Wt 232.0 lb

## 2018-06-02 DIAGNOSIS — E119 Type 2 diabetes mellitus without complications: Secondary | ICD-10-CM

## 2018-06-02 LAB — CBC WITH DIFFERENTIAL/PLATELET
BASOS PCT: 0.8 % (ref 0.0–3.0)
Basophils Absolute: 0.1 10*3/uL (ref 0.0–0.1)
EOS ABS: 0.2 10*3/uL (ref 0.0–0.7)
Eosinophils Relative: 2 % (ref 0.0–5.0)
HCT: 40.2 % (ref 36.0–46.0)
HEMOGLOBIN: 13.3 g/dL (ref 12.0–15.0)
LYMPHS ABS: 2.1 10*3/uL (ref 0.7–4.0)
Lymphocytes Relative: 21.3 % (ref 12.0–46.0)
MCHC: 33.1 g/dL (ref 30.0–36.0)
MCV: 86.5 fl (ref 78.0–100.0)
MONO ABS: 0.6 10*3/uL (ref 0.1–1.0)
Monocytes Relative: 5.8 % (ref 3.0–12.0)
NEUTROS ABS: 6.9 10*3/uL (ref 1.4–7.7)
NEUTROS PCT: 70.1 % (ref 43.0–77.0)
Platelets: 361 10*3/uL (ref 150.0–400.0)
RBC: 4.65 Mil/uL (ref 3.87–5.11)
RDW: 14.5 % (ref 11.5–15.5)
WBC: 9.8 10*3/uL (ref 4.0–10.5)

## 2018-06-02 LAB — HEPATIC FUNCTION PANEL
ALK PHOS: 58 U/L (ref 39–117)
ALT: 16 U/L (ref 0–35)
AST: 12 U/L (ref 0–37)
Albumin: 4 g/dL (ref 3.5–5.2)
BILIRUBIN DIRECT: 0 mg/dL (ref 0.0–0.3)
BILIRUBIN TOTAL: 0.3 mg/dL (ref 0.2–1.2)
Total Protein: 6.8 g/dL (ref 6.0–8.3)

## 2018-06-02 LAB — LIPID PANEL
Cholesterol: 169 mg/dL (ref 0–200)
HDL: 40.6 mg/dL (ref >39.00)
LDL CALC: 117 mg/dL — AB (ref 0–99)
NonHDL: 128.16
TRIGLYCERIDES: 54 mg/dL (ref 0.0–149.0)
Total CHOL/HDL Ratio: 4
VLDL: 10.8 mg/dL (ref 0.0–40.0)

## 2018-06-02 LAB — BASIC METABOLIC PANEL
BUN: 12 mg/dL (ref 6–23)
CO2: 30 mEq/L (ref 19–32)
Calcium: 9.3 mg/dL (ref 8.4–10.5)
Chloride: 104 mEq/L (ref 96–112)
Creatinine, Ser: 0.75 mg/dL (ref 0.40–1.20)
GFR: 86.8 mL/min (ref >60.00)
Glucose, Bld: 120 mg/dL — ABNORMAL HIGH (ref 70–99)
POTASSIUM: 3.9 meq/L (ref 3.5–5.1)
SODIUM: 139 meq/L (ref 135–145)

## 2018-06-02 LAB — TSH: TSH: 1.31 u[IU]/mL (ref 0.35–4.50)

## 2018-06-02 LAB — HEMOGLOBIN A1C: Hgb A1c MFr Bld: 6.8 % — ABNORMAL HIGH (ref 4.6–6.5)

## 2018-06-02 MED ORDER — TRIAMCINOLONE ACETONIDE 0.1 % EX OINT: 1.0000 "application " | TOPICAL_OINTMENT | Freq: Two times a day (BID) | CUTANEOUS | 1 refills | Status: AC

## 2018-06-02 MED FILL — TRIAMCINOLONE 0.1% OINTMENT: 0.1 | 20 days supply | Qty: 90 | Fill #0

## 2018-06-02 NOTE — Progress Notes (Signed)
   Subjective:    Patient ID: Jamie Hall, female    DOB: 1966/02/19, 52 y.o.   MRN: [id number]  HPI DM- ongoing issue for pt.  On Metformin 500mg  BID- 'but most days I take it once a day'.  UTD on foot exam, eye exam.  On ACE for renal protection.  Pt is down 4 lbs since last visit.  No CP, SOB, HAs, visual changes, edema.  No numbness/tingling of hands/feet.  Denies symptomatic lows.   Review of Systems For ROS see HPI     Objective:   Physical Exam  Constitutional: She is oriented to person, place, and time. She appears well-developed and well-nourished. No distress.  obese  HENT:  Head: Normocephalic and atraumatic.  Eyes: Pupils are equal, round, and reactive to light. Conjunctivae and EOM are normal.  Neck: Normal range of motion. Neck supple. No thyromegaly present.  Cardiovascular: Normal rate, regular rhythm, normal heart sounds and intact distal pulses.  No murmur heard. Pulmonary/Chest: Effort normal and breath sounds normal. No respiratory distress.  Abdominal: Soft. She exhibits no distension. There is no tenderness.  Musculoskeletal: She exhibits no edema.  Lymphadenopathy:    She has no cervical adenopathy.  Neurological: She is alert and oriented to person, place, and time.  Skin: Skin is warm and dry.  Psychiatric: She has a normal mood and affect. Her behavior is normal.  Vitals reviewed.         Assessment & Plan:

## 2018-06-02 NOTE — Assessment & Plan Note (Signed)
Ongoing issue for pt.  UTD on foot exam, eye exam, and on Lisinopril for renal protection.  Encouraged healthy diet and regular exercise.  Check labs.  Adjust meds prn

## 2018-06-02 NOTE — Patient Instructions (Signed)
Follow up in 3-4 months to recheck DM We'll notify you of your lab results and make any changes if needed Continue to work on healthy diet and regular exercise- you can do it!! Call with any questions or concerns Happy Holidays!!!

## 2018-06-03 ENCOUNTER — Other Ambulatory Visit: Payer: Self-pay | Admitting: *Deleted

## 2018-06-03 DIAGNOSIS — E7800 Pure hypercholesterolemia, unspecified: Secondary | ICD-10-CM

## 2018-06-03 MED ORDER — ATORVASTATIN CALCIUM 10 MG PO TABS: 10.0000 mg | ORAL_TABLET | Freq: Every day | ORAL | 3 refills | Status: DC

## 2018-06-03 MED FILL — ATORVASTATIN 10 MG TABLET: 10 | 90 days supply | Qty: 90 | Fill #0

## 2018-07-06 MED FILL — metFORMIN HCL 500 MG TABS: 500 | 30 days supply | Qty: 60 | Fill #2

## 2018-07-07 ENCOUNTER — Other Ambulatory Visit: Payer: Self-pay | Admitting: Family Medicine

## 2018-07-07 MED FILL — LISINOPRIL 10 MG TABLET: 10 | 90 days supply | Qty: 90 | Fill #0

## 2018-07-29 ENCOUNTER — Other Ambulatory Visit (INDEPENDENT_AMBULATORY_CARE_PROVIDER_SITE_OTHER): Payer: 59

## 2018-07-29 DIAGNOSIS — E7800 Pure hypercholesterolemia, unspecified: Secondary | ICD-10-CM | POA: Diagnosis not present

## 2018-07-29 LAB — HEPATIC FUNCTION PANEL
ALK PHOS: 72 U/L (ref 39–117)
ALT: 19 U/L (ref 0–35)
AST: 12 U/L (ref 0–37)
Albumin: 3.9 g/dL (ref 3.5–5.2)
BILIRUBIN TOTAL: 0.3 mg/dL (ref 0.2–1.2)
Bilirubin, Direct: 0.1 mg/dL (ref 0.0–0.3)
Total Protein: 6.3 g/dL (ref 6.0–8.3)

## 2018-07-29 NOTE — Addendum Note (Signed)
Addended by: Katina Dung on: 07/29/2018 07:58 AM   Modules accepted: Orders

## 2018-08-01 ENCOUNTER — Encounter: Payer: Self-pay | Admitting: General Practice

## 2018-08-09 LAB — HM MAMMOGRAPHY: HM Mammogram: NORMAL (ref 0–4)

## 2018-08-10 DIAGNOSIS — Z01419 Encounter for gynecological examination (general) (routine) without abnormal findings: Secondary | ICD-10-CM | POA: Diagnosis not present

## 2018-08-10 DIAGNOSIS — Z1231 Encounter for screening mammogram for malignant neoplasm of breast: Secondary | ICD-10-CM | POA: Diagnosis not present

## 2018-08-10 DIAGNOSIS — Z6841 Body Mass Index (BMI) 40.0 and over, adult: Secondary | ICD-10-CM | POA: Diagnosis not present

## 2018-08-10 DIAGNOSIS — Z975 Presence of (intrauterine) contraceptive device: Secondary | ICD-10-CM | POA: Diagnosis not present

## 2018-08-10 DIAGNOSIS — Z130 Encounter for screening for diseases of the blood and blood-forming organs and certain disorders involving the immune mechanism: Secondary | ICD-10-CM | POA: Diagnosis not present

## 2018-08-10 DIAGNOSIS — Z1389 Encounter for screening for other disorder: Secondary | ICD-10-CM | POA: Diagnosis not present

## 2018-08-23 MED FILL — ATORVASTATIN 10 MG TABLET: 10 | 90 days supply | Qty: 90 | Fill #1

## 2018-08-23 MED FILL — metFORMIN HCL 500 MG TABS: 500 | 30 days supply | Qty: 60 | Fill #3

## 2018-09-21 ENCOUNTER — Encounter: Payer: Self-pay | Admitting: Family Medicine

## 2018-09-21 ENCOUNTER — Other Ambulatory Visit: Payer: Self-pay

## 2018-09-21 ENCOUNTER — Ambulatory Visit (INDEPENDENT_AMBULATORY_CARE_PROVIDER_SITE_OTHER): Payer: 59 | Admitting: Family Medicine

## 2018-09-21 VITALS — BP 122/81 | HR 68 | Temp 98.1°F | Resp 16 | Ht 63.0 in | Wt 233.2 lb

## 2018-09-21 DIAGNOSIS — E119 Type 2 diabetes mellitus without complications: Secondary | ICD-10-CM | POA: Diagnosis not present

## 2018-09-21 LAB — BASIC METABOLIC PANEL
BUN: 13 mg/dL (ref 6–23)
CHLORIDE: 103 meq/L (ref 96–112)
CO2: 28 meq/L (ref 19–32)
CREATININE: 0.78 mg/dL (ref 0.40–1.20)
Calcium: 9.2 mg/dL (ref 8.4–10.5)
GFR: 77.95 mL/min (ref >60.00)
Glucose, Bld: 122 mg/dL — ABNORMAL HIGH (ref 70–99)
Potassium: 4.2 mEq/L (ref 3.5–5.1)
Sodium: 141 mEq/L (ref 135–145)

## 2018-09-21 LAB — HEMOGLOBIN A1C: Hgb A1c MFr Bld: 7.1 % — ABNORMAL HIGH (ref 4.6–6.5)

## 2018-09-21 NOTE — Assessment & Plan Note (Signed)
Ongoing issue for pt.  Hx of adequate control.  Applauded her decision to join a gym.  UTD on foot exam, eye exam, and on ACE for renal protection.  Check labs.  Adjust meds prn

## 2018-09-21 NOTE — Assessment & Plan Note (Signed)
Ongoing issue for pt.  Stressed need for healthy diet and regular exercise.  Will continue to follow. 

## 2018-09-21 NOTE — Patient Instructions (Signed)
Schedule your complete physical in 3-4 months We'll notify you of your lab results and make any changes if needed Continue to work on healthy diet and regular exercise- I'm so proud of you! Call with any questions or concerns Happy Spring!!

## 2018-09-21 NOTE — Progress Notes (Signed)
   Subjective:    Patient ID: Jamie Hall, female    DOB: [birth year], 53 y.o.   MRN: [id number]  HPI DM- chronic problem, on Metformin 500mg  BID.  Last A1C 6.8.  On ACE for renal protection.  UTD on eye exam, foot exam.  Joined gym- going 2x/week on most weeks.  No CP, SOB, HAs, visual changes, abd pain, N/V.  No symptomatic lows.  No numbness/tingling of hands/feet.   Review of Systems For ROS see HPI     Objective:   Physical Exam Vitals signs reviewed.  Constitutional:      General: She is not in acute distress.    Appearance: She is well-developed. She is obese.  HENT:     Head: Normocephalic and atraumatic.  Eyes:     Conjunctiva/sclera: Conjunctivae normal.     Pupils: Pupils are equal, round, and reactive to light.  Neck:     Musculoskeletal: Normal range of motion and neck supple.     Thyroid: No thyromegaly.  Cardiovascular:     Rate and Rhythm: Normal rate and regular rhythm.     Heart sounds: Normal heart sounds. No murmur.  Pulmonary:     Effort: Pulmonary effort is normal. No respiratory distress.     Breath sounds: Normal breath sounds.  Abdominal:     General: There is no distension.     Palpations: Abdomen is soft.     Tenderness: There is no abdominal tenderness.  Lymphadenopathy:     Cervical: No cervical adenopathy.  Skin:    General: Skin is warm and dry.  Neurological:     Mental Status: She is alert and oriented to person, place, and time.  Psychiatric:        Behavior: Behavior normal.           Assessment & Plan:

## 2018-09-22 ENCOUNTER — Encounter: Payer: Self-pay | Admitting: General Practice

## 2018-09-23 ENCOUNTER — Other Ambulatory Visit: Payer: Self-pay | Admitting: Family Medicine

## 2018-09-23 MED FILL — metFORMIN HCL 500 MG TABS: 500 | 30 days supply | Qty: 60 | Fill #0

## 2018-09-27 MED FILL — LISINOPRIL 10 MG TABLET: 10 | 30 days supply | Qty: 30 | Fill #1

## 2018-10-31 ENCOUNTER — Other Ambulatory Visit: Payer: Self-pay | Admitting: Family Medicine

## 2018-10-31 MED FILL — LISINOPRIL 10 MG TABLET: 10 | 30 days supply | Qty: 30 | Fill #0

## 2018-10-31 MED FILL — metFORMIN HCL 500 MG TABS: 500 | 30 days supply | Qty: 60 | Fill #1

## 2018-11-08 MED FILL — ATORVASTATIN 10 MG TABLET: 10 | 90 days supply | Qty: 90 | Fill #2

## 2018-12-08 MED FILL — metFORMIN HCL 500 MG TABS: 500 | 30 days supply | Qty: 60 | Fill #2

## 2018-12-08 MED FILL — LISINOPRIL 10 MG TABLET: 10 | 30 days supply | Qty: 30 | Fill #1

## 2019-01-11 MED FILL — LISINOPRIL 10 MG TABLET: 10 | 30 days supply | Qty: 30 | Fill #2

## 2019-01-11 MED FILL — metFORMIN HCL 500 MG TABS: 500 | 30 days supply | Qty: 60 | Fill #3

## 2019-01-25 ENCOUNTER — Encounter: Payer: 59 | Admitting: Family Medicine

## 2019-01-31 ENCOUNTER — Other Ambulatory Visit: Payer: Self-pay | Admitting: Family Medicine

## 2019-01-31 MED FILL — OMEPRAZOLE 20 MG CPDR: 20 | 90 days supply | Qty: 90 | Fill #0

## 2019-01-31 MED FILL — ATORVASTATIN 10 MG TABLET: 10 | 90 days supply | Qty: 90 | Fill #3

## 2019-02-01 ENCOUNTER — Other Ambulatory Visit: Payer: Self-pay | Admitting: Family Medicine

## 2019-02-03 ENCOUNTER — Ambulatory Visit (INDEPENDENT_AMBULATORY_CARE_PROVIDER_SITE_OTHER): Payer: 59 | Admitting: Family Medicine

## 2019-02-03 ENCOUNTER — Other Ambulatory Visit: Payer: Self-pay

## 2019-02-03 ENCOUNTER — Encounter: Payer: Self-pay | Admitting: Family Medicine

## 2019-02-03 VITALS — BP 124/80 | HR 78 | Temp 97.9°F | Resp 16 | Ht 63.0 in | Wt 233.0 lb

## 2019-02-03 DIAGNOSIS — E119 Type 2 diabetes mellitus without complications: Secondary | ICD-10-CM

## 2019-02-03 DIAGNOSIS — Z0000 Encounter for general adult medical examination without abnormal findings: Secondary | ICD-10-CM | POA: Diagnosis not present

## 2019-02-03 LAB — CBC WITH DIFFERENTIAL/PLATELET
Basophils Absolute: 0.1 10*3/uL (ref 0.0–0.1)
Basophils Relative: 1.1 % (ref 0.0–3.0)
Eosinophils Absolute: 0.4 10*3/uL (ref 0.0–0.7)
Eosinophils Relative: 3.6 % (ref 0.0–5.0)
HCT: 40 % (ref 36.0–46.0)
Hemoglobin: 12.9 g/dL (ref 12.0–15.0)
Lymphocytes Relative: 21.4 % (ref 12.0–46.0)
Lymphs Abs: 2.1 10*3/uL (ref 0.7–4.0)
MCHC: 32.3 g/dL (ref 30.0–36.0)
MCV: 88.6 fl (ref 78.0–100.0)
Monocytes Absolute: 0.6 10*3/uL (ref 0.1–1.0)
Monocytes Relative: 5.9 % (ref 3.0–12.0)
Neutro Abs: 6.6 10*3/uL (ref 1.4–7.7)
Neutrophils Relative %: 68 % (ref 43.0–77.0)
Platelets: 373 10*3/uL (ref 150.0–400.0)
RBC: 4.52 Mil/uL (ref 3.87–5.11)
RDW: 14.2 % (ref 11.5–15.5)
WBC: 9.7 10*3/uL (ref 4.0–10.5)

## 2019-02-03 LAB — LIPID PANEL
Cholesterol: 112 mg/dL (ref 0–200)
HDL: 33 mg/dL — ABNORMAL LOW (ref >39.00)
LDL Cholesterol: 67 mg/dL (ref 0–99)
NonHDL: 78.56
Total CHOL/HDL Ratio: 3
Triglycerides: 58 mg/dL (ref 0.0–149.0)
VLDL: 11.6 mg/dL (ref 0.0–40.0)

## 2019-02-03 LAB — HEPATIC FUNCTION PANEL
ALT: 16 U/L (ref 0–35)
AST: 11 U/L (ref 0–37)
Albumin: 4.1 g/dL (ref 3.5–5.2)
Alkaline Phosphatase: 76 U/L (ref 39–117)
Bilirubin, Direct: 0.1 mg/dL (ref 0.0–0.3)
Total Bilirubin: 0.4 mg/dL (ref 0.2–1.2)
Total Protein: 6.5 g/dL (ref 6.0–8.3)

## 2019-02-03 LAB — TSH: TSH: 1.33 u[IU]/mL (ref 0.35–4.50)

## 2019-02-03 LAB — BASIC METABOLIC PANEL
BUN: 10 mg/dL (ref 6–23)
CO2: 29 mEq/L (ref 19–32)
Calcium: 8.7 mg/dL (ref 8.4–10.5)
Chloride: 105 mEq/L (ref 96–112)
Creatinine, Ser: 0.77 mg/dL (ref 0.40–1.20)
GFR: 79.01 mL/min (ref >60.00)
Glucose, Bld: 119 mg/dL — ABNORMAL HIGH (ref 70–99)
Potassium: 4.1 mEq/L (ref 3.5–5.1)
Sodium: 142 mEq/L (ref 135–145)

## 2019-02-03 LAB — HEMOGLOBIN A1C: Hgb A1c MFr Bld: 7.2 % — ABNORMAL HIGH (ref 4.6–6.5)

## 2019-02-03 LAB — VITAMIN D 25 HYDROXY (VIT D DEFICIENCY, FRACTURES): VITD: 14.8 ng/mL — ABNORMAL LOW (ref 30.00–100.00)

## 2019-02-03 NOTE — Progress Notes (Signed)
   Subjective:    Patient ID: Jamie Hall, female    DOB: 05-07-1966, 53 y.o.   MRN: [id number]  HPI CPE- UTD on pap, colonoscopy, mammo.  No concerns today.  On ACE for renal protection, foot exam, eye exam   Review of Systems Patient reports no vision/ hearing changes, adenopathy,fever, weight change,  persistant/recurrent hoarseness , swallowing issues, chest pain, palpitations, edema, persistant/recurrent cough, hemoptysis, dyspnea (rest/exertional/paroxysmal nocturnal), gastrointestinal bleeding (melena, rectal bleeding), abdominal pain, significant heartburn, bowel changes, GU symptoms (dysuria, hematuria, incontinence), Gyn symptoms (abnormal  bleeding, pain),  syncope, focal weakness, memory loss, numbness & tingling, skin/hair/nail changes, abnormal bruising or bleeding, anxiety, or depression.     Objective:   Physical Exam General Appearance:    Alert, cooperative, no distress, appears stated age, obese  Head:    Normocephalic, without obvious abnormality, atraumatic  Eyes:    PERRL, conjunctiva/corneas clear, EOM's intact, fundi    benign, both eyes  Ears:    Normal TM's and external ear canals, both ears  Nose:   Deferred due to COVID  Throat:   Neck:   Supple, symmetrical, trachea midline, no adenopathy;    Thyroid: no enlargement/tenderness/nodules  Back:     Symmetric, no curvature, ROM normal, no CVA tenderness  Lungs:     Clear to auscultation bilaterally, respirations unlabored  Chest Wall:    No tenderness or deformity   Heart:    Regular rate and rhythm, S1 and S2 normal, no murmur, rub   or gallop  Breast Exam:    Deferred to GYN  Abdomen:     Soft, non-tender, bowel sounds active all four quadrants,    no masses, no organomegaly  Genitalia:    Deferred to GYN  Rectal:    Extremities:   Extremities normal, atraumatic, no cyanosis or edema  Pulses:   2+ and symmetric all extremities  Skin:   Skin color, texture, turgor normal, no rashes or lesions  Lymph  nodes:   Cervical, supraclavicular, and axillary nodes normal  Neurologic:   CNII-XII intact, normal strength, sensation and reflexes    throughout          Assessment & Plan:

## 2019-02-03 NOTE — Assessment & Plan Note (Signed)
Pt's PE WNL w/ exception of obesity.  UTD on GYN, colonoscopy.  Check labs.  Anticipatory guidance provided.

## 2019-02-03 NOTE — Assessment & Plan Note (Signed)
Ongoing issue for pt.  Stressed need for healthy diet and regular exercise.  Will follow. 

## 2019-02-03 NOTE — Assessment & Plan Note (Signed)
Chronic problem.  UTD on foot exam, eye exam, and on ACE for renal protection.  Stressed need for healthy diet and regular exercise.  Check labs.  Adjust meds prn

## 2019-02-03 NOTE — Patient Instructions (Signed)
Follow up in 3-4 months to recheck diabetes We'll notify you of your lab results and make any changes if needed Continue to work on healthy diet and regular exercise- you can do it! Call with any questions or concerns Stay Safe!!!

## 2019-02-04 MED FILL — LISINOPRIL 10 MG TABLET: 10 | 30 days supply | Qty: 30 | Fill #3

## 2019-02-04 MED FILL — metFORMIN HCL 500 MG TABS: 500 | 30 days supply | Qty: 60 | Fill #0

## 2019-02-06 ENCOUNTER — Other Ambulatory Visit: Payer: Self-pay | Admitting: General Practice

## 2019-02-06 MED ORDER — VITAMIN D (ERGOCALCIFEROL) 1.25 MG (50000 UNIT) PO CAPS: 50000.0000 [IU] | ORAL_CAPSULE | ORAL | 0 refills | Status: DC

## 2019-02-06 MED FILL — VIT D2 1.25 MG (50,000 UNIT: 1.25 MG | 84 days supply | Qty: 12 | Fill #0

## 2019-03-20 ENCOUNTER — Other Ambulatory Visit: Payer: Self-pay | Admitting: Family Medicine

## 2019-03-20 MED FILL — LISINOPRIL 10 MG TABS: 10 | 30 days supply | Qty: 30 | Fill #0

## 2019-04-12 MED FILL — metFORMIN HCL 500 MG TABS: 500 | 30 days supply | Qty: 60 | Fill #1

## 2019-04-13 MED FILL — LISINOPRIL 10 MG TABS: 10 | 30 days supply | Qty: 30 | Fill #1

## 2019-04-18 MED FILL — ATORVASTATIN 10 MG TABLET: 10 | 90 days supply | Qty: 90 | Fill #0

## 2019-05-16 MED FILL — metFORMIN HCL 500 MG TABS: 500 | 30 days supply | Qty: 60 | Fill #2

## 2019-05-16 MED FILL — LISINOPRIL 10 MG TABS: 10 | 30 days supply | Qty: 30 | Fill #2

## 2019-05-18 ENCOUNTER — Encounter: Payer: Self-pay | Admitting: General Practice

## 2019-05-18 ENCOUNTER — Ambulatory Visit (INDEPENDENT_AMBULATORY_CARE_PROVIDER_SITE_OTHER): Payer: 59 | Admitting: Family Medicine

## 2019-05-18 ENCOUNTER — Other Ambulatory Visit: Payer: Self-pay

## 2019-05-18 ENCOUNTER — Encounter: Payer: Self-pay | Admitting: Family Medicine

## 2019-05-18 VITALS — BP 121/83 | HR 97 | Temp 97.8°F | Resp 16 | Ht 63.0 in | Wt 231.5 lb

## 2019-05-18 DIAGNOSIS — E119 Type 2 diabetes mellitus without complications: Secondary | ICD-10-CM

## 2019-05-18 LAB — BASIC METABOLIC PANEL
BUN: 10 mg/dL (ref 6–23)
CO2: 32 mEq/L (ref 19–32)
Calcium: 9.4 mg/dL (ref 8.4–10.5)
Chloride: 101 mEq/L (ref 96–112)
Creatinine, Ser: 0.81 mg/dL (ref 0.40–1.20)
GFR: 74.44 mL/min (ref >60.00)
Glucose, Bld: 134 mg/dL — ABNORMAL HIGH (ref 70–99)
Potassium: 4.3 mEq/L (ref 3.5–5.1)
Sodium: 140 mEq/L (ref 135–145)

## 2019-05-18 LAB — HEMOGLOBIN A1C: Hgb A1c MFr Bld: 7.2 % — ABNORMAL HIGH (ref 4.6–6.5)

## 2019-05-18 MED ORDER — METFORMIN HCL 500 MG PO TABS: 500.0000 mg | ORAL_TABLET | Freq: Two times a day (BID) | ORAL | 1 refills | Status: DC

## 2019-05-18 MED ORDER — LISINOPRIL 10 MG PO TABS: 10.0000 mg | ORAL_TABLET | Freq: Every day | ORAL | 1 refills | Status: DC

## 2019-05-18 NOTE — Patient Instructions (Signed)
Follow up in 3-4 months to recheck sugars We'll notify you of your lab results and make any changes if needed Continue to work on healthy diet and regular exercise- you can do it! Call with any questions or concerns Stay Safe!!! 

## 2019-05-18 NOTE — Progress Notes (Signed)
   Subjective:    Patient ID: Jamie Hall, female    DOB: [birth year], 53 y.o.   MRN: [id number]  HPI DM- chronic problem.  On Metformin 500mg  BID.  Weight is stable- BMI 41.  Not exercising.  Denies CP, SOB, HAs, visual changes, abd pain, N/V, edema.  No symptomatic lows.  No numbness/tingling of hands/feet.  UTD on eye exam, foot exam, and on ACE.  Eye exam due next month- already scheduled.  UTD on flu   Review of Systems For ROS see HPI     Objective:   Physical Exam Vitals signs reviewed.  Constitutional:      General: She is not in acute distress.    Appearance: She is well-developed. She is obese.  HENT:     Head: Normocephalic and atraumatic.  Eyes:     Conjunctiva/sclera: Conjunctivae normal.     Pupils: Pupils are equal, round, and reactive to light.  Neck:     Musculoskeletal: Normal range of motion and neck supple.     Thyroid: No thyromegaly.  Cardiovascular:     Rate and Rhythm: Normal rate and regular rhythm.     Heart sounds: Normal heart sounds. No murmur.  Pulmonary:     Effort: Pulmonary effort is normal. No respiratory distress.     Breath sounds: Normal breath sounds.  Abdominal:     General: There is no distension.     Palpations: Abdomen is soft.     Tenderness: There is no abdominal tenderness.  Lymphadenopathy:     Cervical: No cervical adenopathy.  Skin:    General: Skin is warm and dry.  Neurological:     Mental Status: She is alert and oriented to person, place, and time.  Psychiatric:        Behavior: Behavior normal.           Assessment & Plan:

## 2019-05-18 NOTE — Assessment & Plan Note (Signed)
Chronic problem.  Tolerating metformin w/o difficulty.  UTD on foot exam, on ACE, has eye exam scheduled.  Stressed the need for healthy diet and regular exercise.  Check labs.  Adjust meds prn

## 2019-06-20 MED FILL — metFORMIN HCL 500 MG TABS: 500 | 30 days supply | Qty: 60 | Fill #3

## 2019-06-20 MED FILL — LISINOPRIL 10 MG TABS: 10 | 30 days supply | Qty: 30 | Fill #3

## 2019-07-18 MED FILL — LISINOPRIL 10 MG TABS: 10 | 90 days supply | Qty: 90 | Fill #0

## 2019-07-18 MED FILL — metFORMIN HCL 500 MG TABS: 500 | 90 days supply | Qty: 180 | Fill #0

## 2019-07-31 LAB — HM DIABETES EYE EXAM

## 2019-08-15 DIAGNOSIS — Z130 Encounter for screening for diseases of the blood and blood-forming organs and certain disorders involving the immune mechanism: Secondary | ICD-10-CM | POA: Diagnosis not present

## 2019-08-15 DIAGNOSIS — Z975 Presence of (intrauterine) contraceptive device: Secondary | ICD-10-CM | POA: Diagnosis not present

## 2019-08-15 DIAGNOSIS — Z1389 Encounter for screening for other disorder: Secondary | ICD-10-CM | POA: Diagnosis not present

## 2019-08-15 DIAGNOSIS — Z01419 Encounter for gynecological examination (general) (routine) without abnormal findings: Secondary | ICD-10-CM | POA: Diagnosis not present

## 2019-08-15 DIAGNOSIS — Z6841 Body Mass Index (BMI) 40.0 and over, adult: Secondary | ICD-10-CM | POA: Diagnosis not present

## 2019-08-15 DIAGNOSIS — Z1231 Encounter for screening mammogram for malignant neoplasm of breast: Secondary | ICD-10-CM | POA: Diagnosis not present

## 2019-08-15 LAB — HM MAMMOGRAPHY: HM Mammogram: NORMAL (ref 0–4)

## 2019-08-15 LAB — HM PAP SMEAR

## 2019-08-18 ENCOUNTER — Encounter: Payer: Self-pay | Admitting: Family Medicine

## 2019-08-18 ENCOUNTER — Other Ambulatory Visit: Payer: Self-pay

## 2019-08-18 ENCOUNTER — Ambulatory Visit (INDEPENDENT_AMBULATORY_CARE_PROVIDER_SITE_OTHER): Payer: 59 | Admitting: Family Medicine

## 2019-08-18 VITALS — BP 131/81 | HR 77 | Temp 97.9°F | Resp 16 | Ht 63.0 in | Wt 233.5 lb

## 2019-08-18 DIAGNOSIS — E119 Type 2 diabetes mellitus without complications: Secondary | ICD-10-CM | POA: Diagnosis not present

## 2019-08-18 DIAGNOSIS — E1169 Type 2 diabetes mellitus with other specified complication: Secondary | ICD-10-CM | POA: Diagnosis not present

## 2019-08-18 DIAGNOSIS — E785 Hyperlipidemia, unspecified: Secondary | ICD-10-CM | POA: Diagnosis not present

## 2019-08-18 DIAGNOSIS — I10 Essential (primary) hypertension: Secondary | ICD-10-CM | POA: Diagnosis not present

## 2019-08-18 LAB — HEPATIC FUNCTION PANEL
ALT: 24 U/L (ref 0–35)
AST: 15 U/L (ref 0–37)
Albumin: 4 g/dL (ref 3.5–5.2)
Alkaline Phosphatase: 83 U/L (ref 39–117)
Bilirubin, Direct: 0.1 mg/dL (ref 0.0–0.3)
Total Bilirubin: 0.3 mg/dL (ref 0.2–1.2)
Total Protein: 6.5 g/dL (ref 6.0–8.3)

## 2019-08-18 LAB — BASIC METABOLIC PANEL
BUN: 10 mg/dL (ref 6–23)
CO2: 29 mEq/L (ref 19–32)
Calcium: 9.3 mg/dL (ref 8.4–10.5)
Chloride: 102 mEq/L (ref 96–112)
Creatinine, Ser: 0.74 mg/dL (ref 0.40–1.20)
GFR: 82.54 mL/min (ref >60.00)
Glucose, Bld: 122 mg/dL — ABNORMAL HIGH (ref 70–99)
Potassium: 4.5 mEq/L (ref 3.5–5.1)
Sodium: 140 mEq/L (ref 135–145)

## 2019-08-18 LAB — LIPID PANEL
Cholesterol: 120 mg/dL (ref 0–200)
HDL: 37.2 mg/dL — ABNORMAL LOW (ref >39.00)
LDL Cholesterol: 73 mg/dL (ref 0–99)
NonHDL: 82.58
Total CHOL/HDL Ratio: 3
Triglycerides: 47 mg/dL (ref 0.0–149.0)
VLDL: 9.4 mg/dL (ref 0.0–40.0)

## 2019-08-18 LAB — CBC WITH DIFFERENTIAL/PLATELET
Basophils Absolute: 0 10*3/uL (ref 0.0–0.1)
Basophils Relative: 0.3 % (ref 0.0–3.0)
Eosinophils Absolute: 0.4 10*3/uL (ref 0.0–0.7)
Eosinophils Relative: 4.1 % (ref 0.0–5.0)
HCT: 39.5 % (ref 36.0–46.0)
Hemoglobin: 12.8 g/dL (ref 12.0–15.0)
Lymphocytes Relative: 20.9 % (ref 12.0–46.0)
Lymphs Abs: 2.2 10*3/uL (ref 0.7–4.0)
MCHC: 32.3 g/dL (ref 30.0–36.0)
MCV: 87.1 fl (ref 78.0–100.0)
Monocytes Absolute: 0.7 10*3/uL (ref 0.1–1.0)
Monocytes Relative: 6.5 % (ref 3.0–12.0)
Neutro Abs: 7.1 10*3/uL (ref 1.4–7.7)
Neutrophils Relative %: 68.2 % (ref 43.0–77.0)
Platelets: 387 10*3/uL (ref 150.0–400.0)
RBC: 4.54 Mil/uL (ref 3.87–5.11)
RDW: 14.3 % (ref 11.5–15.5)
WBC: 10.4 10*3/uL (ref 4.0–10.5)

## 2019-08-18 LAB — TSH: TSH: 1.55 u[IU]/mL (ref 0.35–4.50)

## 2019-08-18 LAB — HEMOGLOBIN A1C: Hgb A1c MFr Bld: 7.2 % — ABNORMAL HIGH (ref 4.6–6.5)

## 2019-08-18 NOTE — Patient Instructions (Addendum)
Follow up in 3-4 months to recheck diabetes We'll notify you of your lab results and make any changes if needed Try and work on healthy diet and regular exercise- you can do it! Try and substitute natural sugars (fruit) in place of processed sugars Schedule your eye exam Call with any questions or concerns Stay Safe!  Stay Healthy!

## 2019-08-18 NOTE — Assessment & Plan Note (Signed)
Ongoing issue.  Again discussed need for healthy diet and regular exercise.  Talked about substituting fruit for processed sugars.  Meal planning when possible.  Will follow.

## 2019-08-18 NOTE — Assessment & Plan Note (Signed)
Chronic problem.  Adequate control.  Asymptomatic.  Check labs.  No anticipated med changes.  Will follow. 

## 2019-08-18 NOTE — Assessment & Plan Note (Signed)
Chronic problem.  Tolerating statin w/o difficulty.  Stressed need for healthy diet and regular exercise.  Check labs.  Adjust meds prn  

## 2019-08-18 NOTE — Progress Notes (Signed)
   Subjective:    Patient ID: Jamie Hall, female    DOB: May 30, 1966, 54 y.o.   MRN: [id number]  HPI DM- chronic problem, on Metformin 500mg  BID.  UTD on foot exam, on ACE for renal protection.  Due for eye exam.  Pt reports she craves sugar.  'it's my drug'.  Rare symptomatic lows.  No numbness/tingling of hands/feet.  No sores or blisters on feet.  Last A1C 7.2  Obesity- chronic problem.  BMI is 41.36  No regular exercise, no particular diet.  HTN- chronic problem, on Lisinopril 10mg  daily w/ adequate control.  No CP, SOB, HAs, visual changes, edema.  Hyperlipidemia- chronic problem, on Lipitor 10mg  daily.  No abd pain, N/V.   Review of Systems For ROS see HPI   This visit occurred during the SARS-CoV-2 public health emergency.  Safety protocols were in place, including screening questions prior to the visit, additional usage of staff PPE, and extensive cleaning of exam room while observing appropriate contact time as indicated for disinfecting solutions.       Objective:   Physical Exam Vitals reviewed.  Constitutional:      General: She is not in acute distress.    Appearance: She is well-developed. She is obese.  HENT:     Head: Normocephalic and atraumatic.  Eyes:     Conjunctiva/sclera: Conjunctivae normal.     Pupils: Pupils are equal, round, and reactive to light.  Neck:     Thyroid: No thyromegaly.  Cardiovascular:     Rate and Rhythm: Normal rate and regular rhythm.     Heart sounds: Normal heart sounds. No murmur.  Pulmonary:     Effort: Pulmonary effort is normal. No respiratory distress.     Breath sounds: Normal breath sounds.  Abdominal:     General: There is no distension.     Palpations: Abdomen is soft.     Tenderness: There is no abdominal tenderness.  Musculoskeletal:     Cervical back: Normal range of motion and neck supple.  Lymphadenopathy:     Cervical: No cervical adenopathy.  Skin:    General: Skin is warm and dry.  Neurological:   Mental Status: She is alert and oriented to person, place, and time.  Psychiatric:        Behavior: Behavior normal.           Assessment & Plan:

## 2019-08-18 NOTE — Assessment & Plan Note (Signed)
Ongoing issue.  Again stressed need for healthy diet and regular exercise.  Pt admits that she has issues w/ grocery shopping and meal prepping.  UTD on foot exam.  On ACE for renal protection.  Overdue for eye exam.  Pt to schedule.  Check labs.  Adjust meds prn

## 2019-08-22 ENCOUNTER — Encounter: Payer: Self-pay | Admitting: General Practice

## 2019-09-02 DIAGNOSIS — H5213 Myopia, bilateral: Secondary | ICD-10-CM | POA: Diagnosis not present

## 2019-09-12 MED FILL — ATORVASTATIN 10 MG TABLET: 10 | 90 days supply | Qty: 90 | Fill #1

## 2019-09-25 MED FILL — OMEPRAZOLE 20 MG CAP: 20 | 90 days supply | Qty: 90 | Fill #1

## 2019-10-02 ENCOUNTER — Ambulatory Visit: Payer: 59 | Admitting: Physician Assistant

## 2019-10-10 MED FILL — LISINOPRIL 10 MG TABS: 10 | 90 days supply | Qty: 90 | Fill #1

## 2019-10-19 MED FILL — METFORMIN HCL 500 MG TABS: 500 | 90 days supply | Qty: 180 | Fill #1

## 2019-11-28 ENCOUNTER — Other Ambulatory Visit: Payer: Self-pay

## 2019-11-28 ENCOUNTER — Ambulatory Visit: Payer: 59 | Admitting: Family Medicine

## 2019-11-28 ENCOUNTER — Encounter: Payer: Self-pay | Admitting: Family Medicine

## 2019-11-28 VITALS — BP 129/84 | HR 69 | Temp 97.9°F | Resp 16 | Ht 63.0 in | Wt 236.0 lb

## 2019-11-28 DIAGNOSIS — E119 Type 2 diabetes mellitus without complications: Secondary | ICD-10-CM | POA: Diagnosis not present

## 2019-11-28 LAB — BASIC METABOLIC PANEL
BUN: 11 mg/dL (ref 6–23)
CO2: 31 mEq/L (ref 19–32)
Calcium: 9.5 mg/dL (ref 8.4–10.5)
Chloride: 103 mEq/L (ref 96–112)
Creatinine, Ser: 0.78 mg/dL (ref 0.40–1.20)
GFR: 77.59 mL/min (ref >60.00)
Glucose, Bld: 128 mg/dL — ABNORMAL HIGH (ref 70–99)
Potassium: 4.3 mEq/L (ref 3.5–5.1)
Sodium: 138 mEq/L (ref 135–145)

## 2019-11-28 LAB — HEMOGLOBIN A1C: Hgb A1c MFr Bld: 7.1 % — ABNORMAL HIGH (ref 4.6–6.5)

## 2019-11-28 NOTE — Assessment & Plan Note (Signed)
Ongoing issue.  Pt is aware that she is not doing what she needs to do in order to lose weight.  Encouraged her to start small- daily walking.  Will follow.

## 2019-11-28 NOTE — Assessment & Plan Note (Signed)
Chronic problem.  Pt admits that she is not following low carb diet and regular exercise.  UTD on foot exam, eye exam.  On ACE for renal protection.  Stressed need for healthy diet and regular exercise.  Check labs.  Adjust meds prn

## 2019-11-28 NOTE — Patient Instructions (Signed)
Schedule your complete physical in 3-4 months We'll notify you of your lab results and make any changes if needed Try and follow a low carb diet and add in some regular exercise- walking is a good place to start Call with any questions or concerns Happy Mother's Day!!

## 2019-11-28 NOTE — Progress Notes (Signed)
   Subjective:    Patient ID: Jamie Hall, female    DOB: 01/27/1966, 54 y.o.   MRN: [id number]  HPI DM- chronic problem, on Metformin 500mg  BID.  UTD on eye exam, foot exam.  On ACE for renal protection.  BMI is 41.81.  No CP, SOB, HAs, visual changes, edema, abd pain, N/V.  No numbness/tingling of hands/feet.  Rare symptomatic lows if she doesn't eat.  Obesity- pt's weight is stable.  BMI 41.81.  No regular exercise.  Not following a low carb diet.  'I would love to lose the weight but I haven't done a thing to do it'   Review of Systems For ROS see HPI   This visit occurred during the SARS-CoV-2 public health emergency.  Safety protocols were in place, including screening questions prior to the visit, additional usage of staff PPE, and extensive cleaning of exam room while observing appropriate contact time as indicated for disinfecting solutions.       Objective:   Physical Exam Vitals reviewed.  Constitutional:      General: She is not in acute distress.    Appearance: Normal appearance. She is well-developed. She is obese.  HENT:     Head: Normocephalic and atraumatic.  Eyes:     Conjunctiva/sclera: Conjunctivae normal.     Pupils: Pupils are equal, round, and reactive to light.  Neck:     Thyroid: No thyromegaly.  Cardiovascular:     Rate and Rhythm: Normal rate and regular rhythm.     Heart sounds: Normal heart sounds. No murmur.  Pulmonary:     Effort: Pulmonary effort is normal. No respiratory distress.     Breath sounds: Normal breath sounds.  Abdominal:     General: There is no distension.     Palpations: Abdomen is soft.     Tenderness: There is no abdominal tenderness.  Musculoskeletal:     Cervical back: Normal range of motion and neck supple.  Lymphadenopathy:     Cervical: No cervical adenopathy.  Skin:    General: Skin is warm and dry.  Neurological:     Mental Status: She is alert and oriented to person, place, and time.  Psychiatric:      Behavior: Behavior normal.           Assessment & Plan:

## 2019-11-29 ENCOUNTER — Encounter: Payer: Self-pay | Admitting: General Practice

## 2019-12-01 ENCOUNTER — Other Ambulatory Visit: Payer: Self-pay | Admitting: Family Medicine

## 2019-12-01 MED FILL — ATORVASTATIN CALCIUM 10 MG: 10 | 90 days supply | Qty: 90 | Fill #0

## 2020-01-17 ENCOUNTER — Other Ambulatory Visit: Payer: Self-pay | Admitting: Family Medicine

## 2020-01-17 MED FILL — LISINOPRIL 10 MG TABS: 10 | 90 days supply | Qty: 90 | Fill #0

## 2020-01-31 ENCOUNTER — Other Ambulatory Visit: Payer: Self-pay | Admitting: Family Medicine

## 2020-01-31 MED FILL — METFORMIN HCL 500 MG TABS: 500 | 90 days supply | Qty: 180 | Fill #0

## 2020-01-31 MED FILL — OMEPRAZOLE 20 MG CAP: 20 | 90 days supply | Qty: 90 | Fill #0

## 2020-02-29 MED FILL — ATORVASTATIN CALCIUM 10 MG: 10 | 90 days supply | Qty: 90 | Fill #1

## 2020-04-02 ENCOUNTER — Other Ambulatory Visit: Payer: Self-pay

## 2020-04-02 ENCOUNTER — Ambulatory Visit (INDEPENDENT_AMBULATORY_CARE_PROVIDER_SITE_OTHER): Payer: 59 | Admitting: Family Medicine

## 2020-04-02 ENCOUNTER — Encounter: Payer: Self-pay | Admitting: General Practice

## 2020-04-02 ENCOUNTER — Encounter: Payer: Self-pay | Admitting: Family Medicine

## 2020-04-02 VITALS — BP 121/78 | HR 76 | Temp 97.8°F | Resp 16 | Ht 63.0 in | Wt 238.5 lb

## 2020-04-02 DIAGNOSIS — E559 Vitamin D deficiency, unspecified: Secondary | ICD-10-CM

## 2020-04-02 DIAGNOSIS — E119 Type 2 diabetes mellitus without complications: Secondary | ICD-10-CM | POA: Diagnosis not present

## 2020-04-02 DIAGNOSIS — Z0000 Encounter for general adult medical examination without abnormal findings: Secondary | ICD-10-CM | POA: Diagnosis not present

## 2020-04-02 LAB — LIPID PANEL
Cholesterol: 154 mg/dL (ref 0–200)
HDL: 39.2 mg/dL (ref >39.00)
LDL Cholesterol: 102 mg/dL — ABNORMAL HIGH (ref 0–99)
NonHDL: 115
Total CHOL/HDL Ratio: 4
Triglycerides: 66 mg/dL (ref 0.0–149.0)
VLDL: 13.2 mg/dL (ref 0.0–40.0)

## 2020-04-02 LAB — CBC WITH DIFFERENTIAL/PLATELET
Basophils Absolute: 0.1 10*3/uL (ref 0.0–0.1)
Basophils Relative: 1 % (ref 0.0–3.0)
Eosinophils Absolute: 0.4 10*3/uL (ref 0.0–0.7)
Eosinophils Relative: 3.5 % (ref 0.0–5.0)
HCT: 38.7 % (ref 36.0–46.0)
Hemoglobin: 12.7 g/dL (ref 12.0–15.0)
Lymphocytes Relative: 19.1 % (ref 12.0–46.0)
Lymphs Abs: 2.3 10*3/uL (ref 0.7–4.0)
MCHC: 32.8 g/dL (ref 30.0–36.0)
MCV: 87 fl (ref 78.0–100.0)
Monocytes Absolute: 0.7 10*3/uL (ref 0.1–1.0)
Monocytes Relative: 5.6 % (ref 3.0–12.0)
Neutro Abs: 8.5 10*3/uL — ABNORMAL HIGH (ref 1.4–7.7)
Neutrophils Relative %: 70.8 % (ref 43.0–77.0)
Platelets: 381 10*3/uL (ref 150.0–400.0)
RBC: 4.44 Mil/uL (ref 3.87–5.11)
RDW: 14.8 % (ref 11.5–15.5)
WBC: 12 10*3/uL — ABNORMAL HIGH (ref 4.0–10.5)

## 2020-04-02 LAB — VITAMIN D 25 HYDROXY (VIT D DEFICIENCY, FRACTURES): VITD: 15.17 ng/mL — ABNORMAL LOW (ref 30.00–100.00)

## 2020-04-02 LAB — BASIC METABOLIC PANEL
BUN: 12 mg/dL (ref 6–23)
CO2: 29 mEq/L (ref 19–32)
Calcium: 9.3 mg/dL (ref 8.4–10.5)
Chloride: 101 mEq/L (ref 96–112)
Creatinine, Ser: 0.8 mg/dL (ref 0.40–1.20)
GFR: 75.25 mL/min (ref >60.00)
Glucose, Bld: 142 mg/dL — ABNORMAL HIGH (ref 70–99)
Potassium: 4.3 mEq/L (ref 3.5–5.1)
Sodium: 140 mEq/L (ref 135–145)

## 2020-04-02 LAB — HEPATIC FUNCTION PANEL
ALT: 23 U/L (ref 0–35)
AST: 14 U/L (ref 0–37)
Albumin: 4.1 g/dL (ref 3.5–5.2)
Alkaline Phosphatase: 69 U/L (ref 39–117)
Bilirubin, Direct: 0.1 mg/dL (ref 0.0–0.3)
Total Bilirubin: 0.3 mg/dL (ref 0.2–1.2)
Total Protein: 6.6 g/dL (ref 6.0–8.3)

## 2020-04-02 LAB — HEMOGLOBIN A1C: Hgb A1c MFr Bld: 7.4 % — ABNORMAL HIGH (ref 4.6–6.5)

## 2020-04-02 LAB — TSH: TSH: 2.02 u[IU]/mL (ref 0.35–4.50)

## 2020-04-02 NOTE — Patient Instructions (Addendum)
Follow up in 3-4 months to recheck sugar We'll notify you of your lab results and make any changes if needed Continue to work on healthy diet and regular exercise- you can do it! Let us know when you get your flu shot so we can update the chart Call with any questions or concerns Stay Safe!  Stay Healthy!

## 2020-04-02 NOTE — Assessment & Plan Note (Signed)
Chronic problem, on Metformin.  UTD on eye exam, on ACE for renal protection.  Foot exam done today.  Check labs.  Adjust meds prn

## 2020-04-02 NOTE — Progress Notes (Signed)
   Subjective:    Patient ID: Jamie Hall, female    DOB: [birth year], 54 y.o.   MRN: [id number]  HPI CPE- UTD on pap, mammo, colonoscopy, COVID immunizations.  Reviewed past medical, surgical, family and social histories.  Health Maintenance  Topic Date Due  . TETANUS/TDAP  Never done  . FOOT EXAM  02/03/2020  . INFLUENZA VACCINE  10/24/2020 (Originally 02/25/2020)  . PNEUMOCOCCAL POLYSACCHARIDE VACCINE AGE 42-64 HIGH RISK  11/27/2020 (Originally 01/06/1970)  . Hepatitis C Screening  04/02/2021 (Originally 1965-11-28)  . HIV Screening  04/02/2021 (Originally 01/07/1983)  . HEMOGLOBIN A1C  05/30/2020  . OPHTHALMOLOGY EXAM  07/30/2020  . COLONOSCOPY  02/01/2021  . MAMMOGRAM  08/14/2021  . PAP SMEAR-Modifier  08/14/2022  . COVID-19 Vaccine  Completed    Patient Care Team    Relationship Specialty Notifications Start End  Midge Minium, MD PCP - General Family Medicine  01/17/18   Key, Nelia Shi, NP Nurse Practitioner Gynecology  02/03/19       Review of Systems Patient reports no vision/ hearing changes, adenopathy,fever, weight change,  persistant/recurrent hoarseness , swallowing issues, chest pain, palpitations, edema, persistant/recurrent cough, hemoptysis, dyspnea (rest/exertional/paroxysmal nocturnal), gastrointestinal bleeding (melena, rectal bleeding), abdominal pain, significant heartburn, bowel changes, GU symptoms (dysuria, hematuria, incontinence), Gyn symptoms (abnormal  bleeding, pain),  syncope, focal weakness, memory loss, numbness & tingling, skin/hair/nail changes, abnormal bruising or bleeding, anxiety, or depression.   This visit occurred during the SARS-CoV-2 public health emergency.  Safety protocols were in place, including screening questions prior to the visit, additional usage of staff PPE, and extensive cleaning of exam room while observing appropriate contact time as indicated for disinfecting solutions.       Objective:   Physical Exam General  Appearance:    Alert, cooperative, no distress, appears stated age, obese  Head:    Normocephalic, without obvious abnormality, atraumatic  Eyes:    PERRL, conjunctiva/corneas clear, EOM's intact, fundi    benign, both eyes  Ears:    Normal TM's and external ear canals, both ears  Nose:   Deferred due to COVID  Throat:   Neck:   Supple, symmetrical, trachea midline, no adenopathy;    Thyroid: no enlargement/tenderness/nodules  Back:     Symmetric, no curvature, ROM normal, no CVA tenderness  Lungs:     Clear to auscultation bilaterally, respirations unlabored  Chest Wall:    No tenderness or deformity   Heart:    Regular rate and rhythm, S1 and S2 normal, no murmur, rub   or gallop  Breast Exam:    Deferred to GYN  Abdomen:     Soft, non-tender, bowel sounds active all four quadrants,    no masses, no organomegaly  Genitalia:    Deferred to GYN  Rectal:    Extremities:   Extremities normal, atraumatic, no cyanosis or edema  Pulses:   2+ and symmetric all extremities  Skin:   Skin color, texture, turgor normal, no rashes or lesions  Lymph nodes:   Cervical, supraclavicular, and axillary nodes normal  Neurologic:   CNII-XII intact, normal strength, sensation and reflexes    throughout          Assessment & Plan:

## 2020-04-02 NOTE — Assessment & Plan Note (Signed)
Pt's PE WNL w/ exception of obesity.  UTD on GYN, colonoscopy, COVID vaccines.  Pt will get Tdap and flu at work.  Check labs.  Anticipatory guidance provided.

## 2020-05-02 ENCOUNTER — Emergency Department (HOSPITAL_COMMUNITY): Payer: 59

## 2020-05-02 ENCOUNTER — Encounter (HOSPITAL_COMMUNITY): Payer: Self-pay

## 2020-05-02 ENCOUNTER — Emergency Department (HOSPITAL_COMMUNITY)
Admission: EM | Admit: 2020-05-02 | Discharge: 2020-05-02 | Disposition: A | Discharge status: Home / Self Care | Payer: 59 | Attending: Emergency Medicine | Admitting: Emergency Medicine

## 2020-05-02 DIAGNOSIS — Z7984 Long term (current) use of oral hypoglycemic drugs: Secondary | ICD-10-CM | POA: Insufficient documentation

## 2020-05-02 DIAGNOSIS — Z96652 Presence of left artificial knee joint: Secondary | ICD-10-CM | POA: Insufficient documentation

## 2020-05-02 DIAGNOSIS — R61 Generalized hyperhidrosis: Secondary | ICD-10-CM | POA: Diagnosis not present

## 2020-05-02 DIAGNOSIS — R1032 Left lower quadrant pain: Secondary | ICD-10-CM | POA: Diagnosis present

## 2020-05-02 DIAGNOSIS — E119 Type 2 diabetes mellitus without complications: Secondary | ICD-10-CM | POA: Diagnosis not present

## 2020-05-02 DIAGNOSIS — I10 Essential (primary) hypertension: Secondary | ICD-10-CM | POA: Diagnosis not present

## 2020-05-02 DIAGNOSIS — N200 Calculus of kidney: Secondary | ICD-10-CM | POA: Insufficient documentation

## 2020-05-02 DIAGNOSIS — N281 Cyst of kidney, acquired: Secondary | ICD-10-CM | POA: Diagnosis not present

## 2020-05-02 DIAGNOSIS — N132 Hydronephrosis with renal and ureteral calculous obstruction: Secondary | ICD-10-CM | POA: Diagnosis not present

## 2020-05-02 DIAGNOSIS — K219 Gastro-esophageal reflux disease without esophagitis: Secondary | ICD-10-CM | POA: Insufficient documentation

## 2020-05-02 DIAGNOSIS — Z79899 Other long term (current) drug therapy: Secondary | ICD-10-CM | POA: Insufficient documentation

## 2020-05-02 DIAGNOSIS — R319 Hematuria, unspecified: Secondary | ICD-10-CM | POA: Diagnosis not present

## 2020-05-02 LAB — URINALYSIS, ROUTINE W REFLEX MICROSCOPIC
Bilirubin Urine: NEGATIVE
Glucose, UA: NEGATIVE mg/dL
Ketones, ur: 5 mg/dL — AB
Nitrite: NEGATIVE
Protein, ur: 100 mg/dL — AB
RBC / HPF: >50 RBC/hpf — ABNORMAL HIGH (ref 0–5)
Specific Gravity, Urine: 1.027 (ref 1.005–1.030)
pH: 5 (ref 5.0–8.0)

## 2020-05-02 LAB — COMPREHENSIVE METABOLIC PANEL
ALT: 29 U/L (ref 0–44)
AST: 22 U/L (ref 15–41)
Albumin: 4.3 g/dL (ref 3.5–5.0)
Alkaline Phosphatase: 79 U/L (ref 38–126)
Anion gap: 11 (ref 5–15)
BUN: 14 mg/dL (ref 6–20)
CO2: 25 mmol/L (ref 22–32)
Calcium: 9.1 mg/dL (ref 8.9–10.3)
Chloride: 103 mmol/L (ref 98–111)
Creatinine, Ser: 0.84 mg/dL (ref 0.44–1.00)
GFR calc non Af Amer: >60 mL/min (ref >60)
Glucose, Bld: 167 mg/dL — ABNORMAL HIGH (ref 70–99)
Potassium: 3.3 mmol/L — ABNORMAL LOW (ref 3.5–5.1)
Sodium: 139 mmol/L (ref 135–145)
Total Bilirubin: 0.4 mg/dL (ref 0.3–1.2)
Total Protein: 7.5 g/dL (ref 6.5–8.1)

## 2020-05-02 LAB — CBC WITH DIFFERENTIAL/PLATELET
Abs Immature Granulocytes: 0.1 10*3/uL — ABNORMAL HIGH (ref 0.00–0.07)
Basophils Absolute: 0.1 10*3/uL (ref 0.0–0.1)
Basophils Relative: 1 %
Eosinophils Absolute: 0.7 10*3/uL — ABNORMAL HIGH (ref 0.0–0.5)
Eosinophils Relative: 4 %
HCT: 40.3 % (ref 36.0–46.0)
Hemoglobin: 13 g/dL (ref 12.0–15.0)
Immature Granulocytes: 1 %
Lymphocytes Relative: 22 %
Lymphs Abs: 3.6 10*3/uL (ref 0.7–4.0)
MCH: 28.4 pg (ref 26.0–34.0)
MCHC: 32.3 g/dL (ref 30.0–36.0)
MCV: 88 fL (ref 80.0–100.0)
Monocytes Absolute: 1.1 10*3/uL — ABNORMAL HIGH (ref 0.1–1.0)
Monocytes Relative: 7 %
Neutro Abs: 10.8 10*3/uL — ABNORMAL HIGH (ref 1.7–7.7)
Neutrophils Relative %: 65 %
Platelets: 412 10*3/uL — ABNORMAL HIGH (ref 150–400)
RBC: 4.58 MIL/uL (ref 3.87–5.11)
RDW: 14.2 % (ref 11.5–15.5)
WBC: 16.5 10*3/uL — ABNORMAL HIGH (ref 4.0–10.5)
nRBC: 0 % (ref 0.0–0.2)

## 2020-05-02 LAB — I-STAT BETA HCG BLOOD, ED (MC, WL, AP ONLY): I-stat hCG, quantitative: <5 m[IU]/mL (ref <5)

## 2020-05-02 MED ORDER — CIPROFLOXACIN HCL 500 MG PO TABS
500.0000 mg | ORAL_TABLET | Freq: Once | ORAL | Status: AC
  Administered 2020-05-02: 500 mg via ORAL
  Filled 2020-05-02: qty 1

## 2020-05-02 MED ORDER — MORPHINE SULFATE (PF) 4 MG/ML IV SOLN
4.0000 mg | Freq: Once | INTRAVENOUS | Status: AC
  Administered 2020-05-02: 4 mg via INTRAVENOUS
  Filled 2020-05-02: qty 1

## 2020-05-02 MED ORDER — HYDROCODONE-ACETAMINOPHEN 5-325 MG PO TABS: 1.0000 | ORAL_TABLET | ORAL | 0 refills | Status: DC | PRN

## 2020-05-02 MED ORDER — ONDANSETRON 4 MG PO TBDP: 4.0000 mg | ORAL_TABLET | Freq: Three times a day (TID) | ORAL | 0 refills | Status: DC | PRN

## 2020-05-02 MED ORDER — HYDROCODONE-ACETAMINOPHEN 5-325 MG PO TABS
1.0000 | ORAL_TABLET | Freq: Once | ORAL | Status: AC
  Administered 2020-05-02: 1 via ORAL
  Filled 2020-05-02: qty 1

## 2020-05-02 MED ORDER — CIPROFLOXACIN HCL 500 MG PO TABS: 500.0000 mg | ORAL_TABLET | Freq: Two times a day (BID) | ORAL | 0 refills | Status: AC

## 2020-05-02 MED ORDER — KETOROLAC TROMETHAMINE 15 MG/ML IJ SOLN
15.0000 mg | Freq: Once | INTRAMUSCULAR | Status: AC
  Administered 2020-05-02: 15 mg via INTRAVENOUS
  Filled 2020-05-02: qty 1

## 2020-05-02 MED ORDER — KETOROLAC TROMETHAMINE 30 MG/ML IJ SOLN
30.0000 mg | Freq: Once | INTRAMUSCULAR | Status: AC
  Administered 2020-05-02: 30 mg via INTRAVENOUS
  Filled 2020-05-02: qty 1

## 2020-05-02 MED ORDER — TAMSULOSIN HCL 0.4 MG PO CAPS: 0.4000 mg | ORAL_CAPSULE | Freq: Every day | ORAL | 0 refills | Status: AC

## 2020-05-02 MED ORDER — ONDANSETRON HCL 4 MG/2ML IJ SOLN
4.0000 mg | Freq: Once | INTRAMUSCULAR | Status: AC
  Administered 2020-05-02: 4 mg via INTRAVENOUS
  Filled 2020-05-02: qty 2

## 2020-05-02 MED FILL — METFORMIN HCL 500 MG TABS: 500 | 90 days supply | Qty: 180 | Fill #1

## 2020-05-02 MED FILL — LISINOPRIL 10 MG TABS: 10 | 90 days supply | Qty: 90 | Fill #1

## 2020-05-02 MED FILL — OMEPRAZOLE 20 MG CAP: 20 | 90 days supply | Qty: 90 | Fill #1

## 2020-05-02 NOTE — ED Triage Notes (Signed)
Pt presents with c/o left lower quadrant abdominal pain. Pt reports she has a hx of kidney stones. Pt has some blood in her urine but reports that this is normal for her. Pt is diaphoretic during triage, pain 10/10.

## 2020-05-02 NOTE — ED Provider Notes (Signed)
  Face-to-face evaluation   History: She presents for evaluation of pain left mid abdomen, onset a couple of hours ago, severe in nature.  She has a history of kidney stone.  She denies fever, vomiting, anorexia  Physical exam: Obese, alert, uncomfortable.  No respiratory distress.  Medical screening examination/treatment/procedure(s) were conducted as a shared visit with non-physician practitioner(s) and myself.  I personally evaluated the patient during the encounter    Daleen Bo, MD 05/03/20 [id number]

## 2020-05-02 NOTE — Discharge Instructions (Signed)
Strain urine.  Take Flomax as directed.  You can take Tylenol or Ibuprofen as directed for pain. You can alternate Tylenol and Ibuprofen every 4 hours. If you take Tylenol at 1pm, then you can take Ibuprofen at 5pm. Then you can take Tylenol again at 9pm.   Take pain medications as directed for break through pain. Do not drive or operate machinery while taking this medication.   Take Zofran for nausea.  As we discussed, you will follow-up with urology.  Please call their office tomorrow to arrange for an appointment.  Return to emergency department for any worsening pain, vomiting, persistent fever or any other worsening concerning symptoms.

## 2020-05-02 NOTE — ED Provider Notes (Signed)
[facility] DEPT Provider Note   CSN: [id number] Arrival date & time: 05/02/20  1401     History Chief Complaint  Patient presents with  . Abdominal Pain    Jamie Hall is a 54 y.o. female with PMH/o DM, HTN who presents for evaluation of left lower quadrant abdominal pain.  She reports that initially 2 days ago, she had some mild pain.  She thought that she had just pulled a muscle.  Reports that about an hour and half prior to ED arrival, the pain became significantly worse.  She also started having some nausea/vomiting.  She was going to drive home but on her way home, the pain became more severe, prompting ED visit.  He states it is in the left lower quadrant does not radiate anywhere.  She describes it as a sharp pain.  She has not noted any blood in her urine denies any dysuria.  She has not had any fevers or diarrhea.  She does report that she has had a history of a left kidney stone a few years ago but states that it was a nonobstructing stone nothing was ever done about it.  She denies any chest pain, difficulty breathing, vaginal discharge, vaginal bleeding.   The history is provided by the patient.       Past Medical History:  Diagnosis Date  . Diabetes mellitus without complication ([place])   . GERD (gastroesophageal reflux disease)   . History of headache   . Hypertension     Patient Active Problem List   Diagnosis Date Noted  . Hyperlipidemia associated with type 2 diabetes mellitus ([place]) 08/18/2019  . Physical exam 02/03/2019  . Diabetes mellitus without complication ([place]) [id number]  . Morbid obesity ([place]) 01/17/2018  . HTN (hypertension) 01/17/2018  . Cluster headaches 01/17/2018  . Basal cell carcinoma of skin 02/18/2012    Past Surgical History:  Procedure Laterality Date  . BREAST REDUCTION SURGERY  2006  . CARPECTOMY Left    proximal row, wrist  . GANGLION CYST EXCISION Right 1999   wrist  . KNEE ARTHROSCOPY Left 2000    . TONSILLECTOMY       OB History   No obstetric history on file.     Family History  Problem Relation Age of Onset  . Early death Mother   . Stroke Mother   . Healthy Father   . Healthy Sister   . Healthy Daughter   . Cancer Maternal Grandfather   . Drug abuse Maternal Grandfather   . Hypertension Maternal Grandfather   . Colon cancer Neg Hx   . Esophageal cancer Neg Hx   . Liver cancer Neg Hx   . Pancreatic cancer Neg Hx   . Rectal cancer Neg Hx   . Stomach cancer Neg Hx     Social History   Tobacco Use  . Smoking status: Never Smoker  . Smokeless tobacco: Never Used  Vaping Use  . Vaping Use: Never used  Substance Use Topics  . Alcohol use: Not Currently  . Drug use: Not Currently    Home Medications Prior to Admission medications   Medication Sig Start Date End Date Taking? Authorizing Provider  atorvastatin (LIPITOR) 10 MG tablet TAKE 1 TABLET BY MOUTH AT BEDTIME. Patient taking differently: Take 10 mg by mouth daily.  12/01/19  Yes Midge Minium, MD  ibuprofen (ADVIL) 200 MG tablet Take 800 mg by mouth every 6 (six) hours as needed for moderate pain.  Yes [provider]  levonorgestrel (MIRENA, 52 MG,) 20 MCG/24HR IUD 1 each by Intrauterine route once.    Yes [provider]  lisinopril (ZESTRIL) 10 MG tablet TAKE 1 TABLET (10 MG TOTAL) BY MOUTH DAILY. 01/17/20  Yes Midge Minium, MD  metFORMIN (GLUCOPHAGE) 500 MG tablet TAKE 1 TABLET (500 MG TOTAL) BY MOUTH 2 (TWO) TIMES DAILY WITH A MEAL. 01/31/20  Yes Midge Minium, MD  omeprazole (PRILOSEC) 20 MG capsule TAKE 1 CAPSULE BY MOUTH ONCE DAILY Patient taking differently: Take 20 mg by mouth every other day.  01/31/20  Yes Midge Minium, MD  ciprofloxacin (CIPRO) 500 MG tablet Take 1 tablet (500 mg total) by mouth every 12 (twelve) hours for 7 days. 05/02/20 05/09/20  Volanda Napoleon, PA-C  HYDROcodone-acetaminophen (NORCO/VICODIN) 5-325 MG tablet Take 1-2 tablets by mouth  every 4 (four) hours as needed. 05/02/20   Volanda Napoleon, PA-C  ondansetron (ZOFRAN ODT) 4 MG disintegrating tablet Take 1 tablet (4 mg total) by mouth every 8 (eight) hours as needed for nausea or vomiting. 05/02/20   Volanda Napoleon, PA-C  scopolamine (TRANSDERM-SCOP) 1 MG/3DAYS Place 1 patch (1.5 mg total) onto the skin every 3 (three) days. Patient not taking: Reported on 05/02/2020 01/17/18   Midge Minium, MD  tamsulosin (FLOMAX) 0.4 MG CAPS capsule Take 1 capsule (0.4 mg total) by mouth daily for 5 days. 05/02/20 05/07/20  [place] Lanius A, PA-C    Allergies    Bee venom, Benzoyl peroxide, Ceftin [cefuroxime], Clindamycin/lincomycin, Codeine, Oxycodone-acetaminophen, Penicillins, and Tape  Review of Systems   Review of Systems  Constitutional: Negative for fever.  Respiratory: Negative for cough and shortness of breath.   Cardiovascular: Negative for chest pain.  Gastrointestinal: Positive for abdominal pain, nausea and vomiting.  Genitourinary: Positive for flank pain. Negative for dysuria and hematuria.  Neurological: Negative for headaches.  All other systems reviewed and are negative.   Physical Exam Updated Vital Signs BP (!) 145/88   Pulse 87   Temp 98.5 F (36.9 C) (Oral)   Resp 18   SpO2 94%   Physical Exam Vitals and nursing note reviewed.  Constitutional:      Appearance: Normal appearance. She is well-developed.     Comments: Appears uncomfortable  HENT:     Head: Normocephalic and atraumatic.  Eyes:     General: Lids are normal.     Conjunctiva/sclera: Conjunctivae normal.     Pupils: Pupils are equal, round, and reactive to light.  Cardiovascular:     Rate and Rhythm: Normal rate and regular rhythm.     Pulses: Normal pulses.     Heart sounds: Normal heart sounds. No murmur heard.  No friction rub. No gallop.   Pulmonary:     Effort: Pulmonary effort is normal.     Breath sounds: Normal breath sounds.     Comments: Lungs clear to  auscultation bilaterally.  Symmetric chest rise.  No wheezing, rales, rhonchi. Abdominal:     Palpations: Abdomen is soft. Abdomen is not rigid.     Tenderness: There is abdominal tenderness in the left lower quadrant. There is no guarding.     Comments: Abdomen is soft, nondistended.  Tenderness palpation in the left lower quadrant.  No CVA tenderness noted bilaterally.  No rigidity, guarding.  Musculoskeletal:        General: Normal range of motion.     Cervical back: Full passive range of motion without pain.  Skin:  General: Skin is warm and dry.     Capillary Refill: Capillary refill takes less than 2 seconds.  Neurological:     Mental Status: She is alert and oriented to person, place, and time.  Psychiatric:        Speech: Speech normal.     ED Results / Procedures / Treatments   Labs (all labs ordered are listed, but only abnormal results are displayed) Labs Reviewed  COMPREHENSIVE METABOLIC PANEL - Abnormal; Notable for the following components:      Result Value   Potassium 3.3 (*)    Glucose, Bld 167 (*)    All other components within normal limits  CBC WITH DIFFERENTIAL/PLATELET - Abnormal; Notable for the following components:   WBC 16.5 (*)    Platelets 412 (*)    Neutro Abs 10.8 (*)    Monocytes Absolute 1.1 (*)    Eosinophils Absolute 0.7 (*)    Abs Immature Granulocytes 0.10 (*)    All other components within normal limits  URINALYSIS, ROUTINE W REFLEX MICROSCOPIC - Abnormal; Notable for the following components:   APPearance HAZY (*)    Hgb urine dipstick LARGE (*)    Ketones, ur 5 (*)    Protein, ur 100 (*)    Leukocytes,Ua TRACE (*)    RBC / HPF >50 (*)    Bacteria, UA RARE (*)    All other components within normal limits  URINE CULTURE  I-STAT BETA HCG BLOOD, ED ([facility], [facility], [facility] ONLY)    EKG None  Radiology CT Renal Stone Study  Result Date: 05/02/2020 CLINICAL DATA:  Pt presents with c/o left lower quadrant abdominal pain. Pt reports she has a  hx of kidney stones. Pt has some blood in her urine but reports that this is normal for her. Pt is diaphoretic during triage, pain 10/10. EXAM: CT ABDOMEN AND PELVIS WITHOUT CONTRAST TECHNIQUE: Multidetector CT imaging of the abdomen and pelvis was performed following the standard protocol without IV contrast. COMPARISON:  CT abdomen pelvis 10/23/2015 FINDINGS: Lower chest: No acute abnormality. Evaluation of the abdominal viscera somewhat limited by the lack of IV contrast. Hepatobiliary: No focal liver abnormality is seen. No gallstones, gallbladder wall thickening, or biliary dilatation. Pancreas: Unremarkable. Spleen: Normal in size. Adrenals/Urinary Tract: Normal right adrenal gland. Stable 1.1 cm nodule in the left adrenal gland, most likely an adenoma. Left renal cysts. Mild left hydronephrosis and hydroureter secondary to a 7 mm calculus in the distal left ureter. No right hydronephrosis or renal calculi. Unremarkable urinary bladder. Stomach/Bowel: Stomach is within normal limits. No evidence of bowel wall thickening, distention, or inflammatory changes. Colonic diverticula. Vascular/Lymphatic: No significant vascular findings are present. No enlarged abdominal or pelvic lymph nodes. Reproductive: IUD is present within the uterus.  No adnexal masses. Other: No abdominal wall hernia or abnormality. No abdominopelvic ascites. Musculoskeletal: No acute or significant osseous findings. IMPRESSION: Mild left hydronephrosis and hydroureter secondary to a 7 mm calculus in the distal left ureter. Electronically Signed   By: Audie Pinto M.D.   On: 05/02/2020 16:11    Procedures Procedures (including critical care time)  Medications Ordered in ED Medications  ondansetron (ZOFRAN) injection 4 mg (4 mg Intravenous Given 05/02/20 1437)  morphine 4 MG/ML injection 4 mg (4 mg Intravenous Given 05/02/20 1439)  morphine 4 MG/ML injection 4 mg (4 mg Intravenous Given 05/02/20 1548)  ketorolac (TORADOL) 30 MG/ML  injection 30 mg (30 mg Intravenous Given 05/02/20 1654)  HYDROcodone-acetaminophen (NORCO/VICODIN) 5-325 MG per tablet  1 tablet (1 tablet Oral Given 05/02/20 1824)  ciprofloxacin (CIPRO) tablet 500 mg (500 mg Oral Given 05/02/20 1923)  ketorolac (TORADOL) 15 MG/ML injection 15 mg (15 mg Intravenous Given 05/02/20 1935)    ED Course  I have reviewed the triage vital signs and the nursing notes.  Pertinent labs & imaging results that were available during my care of the patient were reviewed by me and considered in my medical decision making (see chart for details).    MDM Rules/Calculators/A&P                          54 year old female who presents for evaluation of left lower quadrant pain.  Worsened today associated with nausea and vomiting.  No fevers, diarrhea.  Initially arrival, she is afebrile, appears uncomfortable but no acute distress.  She is slightly hypertensive.  Vitals otherwise stable.  On exam there is tenderness palpation of the left lower quadrant.  No CVA tenderness.  Concern for kidney stone versus infectious intra-abdominal process.  Also consider ovarian etiology.  Plan check labs, urine.  I reviewed her records.  She did CT scan in 2017 that showed a nonobstructing stone noted in the left kidney.  I-stat beta is negative. CMP shows normal BUN/Cr. Potassium is 3.3. CBC shows leukocytosis of 16.5. Platelets are 412.  UA shows large hemoglobin, trace leukocytes, pyuria, bacteria.  CT scan shows a 7 mm stone in the distal left ureter with hydronephrosis and hydroureter.  I discussed with Dr. Junious Silk (urology).  Given that patient is hemodynamically stable and pain is controlled, feel the patient is reasonable for outpatient discharge.  Does recommend antibiotics.  I discussed with patient.  Patient with history of hives to penicillin.  She does not know if she has had any a cephalosporin.  Additionally, she knows she is allergic to oxycodone.  She thinks she may have had  hydrocodone in the past but is unsure.  We discussed pain control at home.  She also has a history of allergy to Dilaudid.  Given that she is here in the emergency department, she wants to do a trial of hydrocodone to see if she has any reaction.  Feel this is reasonable.  Reevaluation.  Patient has not had any reaction to hydrocodone.  She reports improvement in pain after Toradol.   Patient with allergy to penicillin.  She does not know if she has been on amoxicillin.  She is also allergic to clindamycin as well as Ceftin.  She is on lisinopril so question if giving her Bactrim would be the right choice given that this could increase her potassium.  Discussed with Dian Situ (pharmacy).  After reviewing her information, he recommends Cipro 500 mg twice daily.  Discussed with patient.  She has tolerated Cipro in the past.  Patient reports feeling much better.  Her pain is completely gone.  She is hemodynamically stable.  Patient instructed to follow-up with urology as directed. At this time, patient exhibits no emergent life-threatening condition that require further evaluation in ED. Patient had ample opportunity for questions and discussion. All patient's questions were answered with full understanding. Strict return precautions discussed. Patient expresses understanding and agreement to plan.   Portions of this note were generated with [employer]. Dictation errors may occur despite best attempts at proofreading.  Final Clinical Impression(s) / ED Diagnoses Final diagnoses:  Kidney stone    Rx / DC Orders ED Discharge Orders  Ordered    HYDROcodone-acetaminophen (NORCO/VICODIN) 5-325 MG tablet  Every 4 hours PRN        05/02/20 1931    ondansetron (ZOFRAN ODT) 4 MG disintegrating tablet  Every 8 hours PRN,   Status:  Discontinued        05/02/20 1931    tamsulosin (FLOMAX) 0.4 MG CAPS capsule  Daily        05/02/20 1931    ciprofloxacin (CIPRO) 500 MG tablet  Every 12  hours        05/02/20 1931    ondansetron (ZOFRAN ODT) 4 MG disintegrating tablet  Every 8 hours PRN        05/02/20 1932           Volanda Napoleon, PA-C 05/02/20 2037    Daleen Bo, MD 05/03/20 2326

## 2020-05-02 NOTE — ED Notes (Signed)
Lab notified to add on urine culture 

## 2020-05-02 NOTE — ED Notes (Signed)
Pt ambulatory to bathroom, standby assistance.  

## 2020-05-04 LAB — URINE CULTURE

## 2020-05-13 DIAGNOSIS — N201 Calculus of ureter: Secondary | ICD-10-CM | POA: Diagnosis not present

## 2020-05-13 DIAGNOSIS — R1084 Generalized abdominal pain: Secondary | ICD-10-CM | POA: Diagnosis not present

## 2020-05-15 DIAGNOSIS — Z87442 Personal history of urinary calculi: Secondary | ICD-10-CM | POA: Diagnosis not present

## 2020-05-15 DIAGNOSIS — N132 Hydronephrosis with renal and ureteral calculous obstruction: Secondary | ICD-10-CM | POA: Diagnosis not present

## 2020-05-15 DIAGNOSIS — N281 Cyst of kidney, acquired: Secondary | ICD-10-CM | POA: Diagnosis not present

## 2020-05-15 DIAGNOSIS — N134 Hydroureter: Secondary | ICD-10-CM | POA: Diagnosis not present

## 2020-05-15 DIAGNOSIS — N201 Calculus of ureter: Secondary | ICD-10-CM | POA: Diagnosis not present

## 2020-05-20 ENCOUNTER — Other Ambulatory Visit (HOSPITAL_COMMUNITY): Payer: Self-pay | Admitting: Urology

## 2020-05-20 MED FILL — TAMSULOSIN HCL 0.4 MG CAP: 0.4 | 30 days supply | Qty: 30 | Fill #0

## 2020-05-28 ENCOUNTER — Other Ambulatory Visit: Payer: Self-pay | Admitting: Urology

## 2020-05-29 ENCOUNTER — Encounter (HOSPITAL_BASED_OUTPATIENT_CLINIC_OR_DEPARTMENT_OTHER): Payer: Self-pay | Admitting: Urology

## 2020-05-29 ENCOUNTER — Other Ambulatory Visit: Payer: Self-pay | Admitting: Urology

## 2020-05-29 ENCOUNTER — Other Ambulatory Visit: Payer: Self-pay

## 2020-05-29 NOTE — Progress Notes (Signed)
Spoke w/ via phone for pre-op interview---pt Lab needs dos----  I stat 8, ekg urine poct             Lab results------none COVID test ------06-06-2020 800 am Arrive at -------1030 am 06-07-2020 NPO after MN NO Solid Food.  Clear liquids from MN until---930 am then npo Medications to take morning of surgery -----omeprazole, hydrocodone prn Diabetic medication -----none day of surgery Patient Special Instructions -----none Pre-Op special Istructions -----none Patient verbalized understanding of instructions that were given at this phone interview. Patient denies shortness of breath, chest pain, fever, cough at this phone interview.

## 2020-06-06 ENCOUNTER — Other Ambulatory Visit (HOSPITAL_COMMUNITY)
Admission: RE | Admit: 2020-06-06 | Discharge: 2020-06-06 | Disposition: A | Discharge status: Home / Self Care | Payer: 59 | Source: Ambulatory Visit | Attending: Urology | Admitting: Urology

## 2020-06-06 DIAGNOSIS — Z20822 Contact with and (suspected) exposure to covid-19: Secondary | ICD-10-CM | POA: Insufficient documentation

## 2020-06-06 DIAGNOSIS — Z01812 Encounter for preprocedural laboratory examination: Secondary | ICD-10-CM | POA: Diagnosis not present

## 2020-06-06 LAB — SARS CORONAVIRUS 2 (TAT 6-24 HRS): SARS Coronavirus 2: NEGATIVE

## 2020-06-07 ENCOUNTER — Ambulatory Visit (HOSPITAL_BASED_OUTPATIENT_CLINIC_OR_DEPARTMENT_OTHER)
Admission: RE | Admit: 2020-06-07 | Discharge: 2020-06-07 | Disposition: A | Discharge status: Home / Self Care | Payer: 59 | Attending: Urology | Admitting: Urology

## 2020-06-07 ENCOUNTER — Ambulatory Visit (HOSPITAL_BASED_OUTPATIENT_CLINIC_OR_DEPARTMENT_OTHER): Payer: 59 | Admitting: Anesthesiology

## 2020-06-07 ENCOUNTER — Encounter (HOSPITAL_BASED_OUTPATIENT_CLINIC_OR_DEPARTMENT_OTHER): Payer: Self-pay | Admitting: Urology

## 2020-06-07 ENCOUNTER — Other Ambulatory Visit: Payer: Self-pay

## 2020-06-07 ENCOUNTER — Encounter (HOSPITAL_BASED_OUTPATIENT_CLINIC_OR_DEPARTMENT_OTHER)
Admission: RE | Disposition: A | Discharge status: Home / Self Care | Payer: Self-pay | Source: Home / Self Care | Attending: Urology

## 2020-06-07 DIAGNOSIS — Z823 Family history of stroke: Secondary | ICD-10-CM | POA: Diagnosis not present

## 2020-06-07 DIAGNOSIS — Z7984 Long term (current) use of oral hypoglycemic drugs: Secondary | ICD-10-CM | POA: Diagnosis not present

## 2020-06-07 DIAGNOSIS — K219 Gastro-esophageal reflux disease without esophagitis: Secondary | ICD-10-CM | POA: Insufficient documentation

## 2020-06-07 DIAGNOSIS — Z91030 Bee allergy status: Secondary | ICD-10-CM | POA: Diagnosis not present

## 2020-06-07 DIAGNOSIS — N132 Hydronephrosis with renal and ureteral calculous obstruction: Secondary | ICD-10-CM | POA: Insufficient documentation

## 2020-06-07 DIAGNOSIS — Z880 Allergy status to penicillin: Secondary | ICD-10-CM | POA: Insufficient documentation

## 2020-06-07 DIAGNOSIS — Z9109 Other allergy status, other than to drugs and biological substances: Secondary | ICD-10-CM | POA: Insufficient documentation

## 2020-06-07 DIAGNOSIS — E119 Type 2 diabetes mellitus without complications: Secondary | ICD-10-CM | POA: Diagnosis not present

## 2020-06-07 DIAGNOSIS — Z87442 Personal history of urinary calculi: Secondary | ICD-10-CM | POA: Diagnosis not present

## 2020-06-07 DIAGNOSIS — Z885 Allergy status to narcotic agent status: Secondary | ICD-10-CM | POA: Insufficient documentation

## 2020-06-07 DIAGNOSIS — Z8249 Family history of ischemic heart disease and other diseases of the circulatory system: Secondary | ICD-10-CM | POA: Insufficient documentation

## 2020-06-07 DIAGNOSIS — N201 Calculus of ureter: Secondary | ICD-10-CM | POA: Diagnosis not present

## 2020-06-07 DIAGNOSIS — Z8489 Family history of other specified conditions: Secondary | ICD-10-CM | POA: Diagnosis not present

## 2020-06-07 DIAGNOSIS — Z881 Allergy status to other antibiotic agents status: Secondary | ICD-10-CM | POA: Diagnosis not present

## 2020-06-07 DIAGNOSIS — Z888 Allergy status to other drugs, medicaments and biological substances status: Secondary | ICD-10-CM | POA: Insufficient documentation

## 2020-06-07 DIAGNOSIS — Z91048 Other nonmedicinal substance allergy status: Secondary | ICD-10-CM | POA: Diagnosis not present

## 2020-06-07 DIAGNOSIS — I10 Essential (primary) hypertension: Secondary | ICD-10-CM | POA: Insufficient documentation

## 2020-06-07 DIAGNOSIS — Q6210 Congenital occlusion of ureter, unspecified: Secondary | ICD-10-CM | POA: Insufficient documentation

## 2020-06-07 HISTORY — PX: CYSTOSCOPY/URETEROSCOPY/HOLMIUM LASER/STENT PLACEMENT: SHX6546

## 2020-06-07 HISTORY — DX: Type 2 diabetes mellitus without complications: E11.9

## 2020-06-07 HISTORY — DX: Headache, unspecified: R51.9

## 2020-06-07 HISTORY — PX: KIDNEY STONE SURGERY: SHX686

## 2020-06-07 HISTORY — DX: Personal history of urinary calculi: Z87.442

## 2020-06-07 HISTORY — DX: Other specified postprocedural states: Z98.890

## 2020-06-07 HISTORY — DX: Other specified postprocedural states: R11.2

## 2020-06-07 LAB — POCT I-STAT, CHEM 8
BUN: 11 mg/dL (ref 6–20)
Calcium, Ion: 1.27 mmol/L (ref 1.15–1.40)
Chloride: 99 mmol/L (ref 98–111)
Creatinine, Ser: 0.7 mg/dL (ref 0.44–1.00)
Glucose, Bld: 116 mg/dL — ABNORMAL HIGH (ref 70–99)
HCT: 41 % (ref 36.0–46.0)
Hemoglobin: 13.9 g/dL (ref 12.0–15.0)
Potassium: 3.7 mmol/L (ref 3.5–5.1)
Sodium: 141 mmol/L (ref 135–145)
TCO2: 26 mmol/L (ref 22–32)

## 2020-06-07 LAB — GLUCOSE, CAPILLARY: Glucose-Capillary: 126 mg/dL — ABNORMAL HIGH (ref 70–99)

## 2020-06-07 LAB — POCT PREGNANCY, URINE: Preg Test, Ur: NEGATIVE

## 2020-06-07 SURGERY — CYSTOSCOPY/URETEROSCOPY/HOLMIUM LASER/STENT PLACEMENT
Anesthesia: General | Site: Renal | Laterality: Left

## 2020-06-07 MED ORDER — PROPOFOL 10 MG/ML IV BOLUS
INTRAVENOUS | Status: DC | PRN
  Administered 2020-06-07: 200 mg via INTRAVENOUS
  Administered 2020-06-07: 50 mg via INTRAVENOUS

## 2020-06-07 MED ORDER — HYDROCODONE-ACETAMINOPHEN 5-325 MG PO TABS: 1.0000 | ORAL_TABLET | ORAL | 0 refills | Status: DC | PRN

## 2020-06-07 MED ORDER — MIDAZOLAM HCL 5 MG/5ML IJ SOLN
INTRAMUSCULAR | Status: DC | PRN
  Administered 2020-06-07: 1 mg via INTRAVENOUS
  Administered 2020-06-07: 2 mg via INTRAVENOUS
  Administered 2020-06-07: 1 mg via INTRAVENOUS

## 2020-06-07 MED ORDER — ONDANSETRON HCL 4 MG/2ML IJ SOLN
INTRAMUSCULAR | Status: AC
  Filled 2020-06-07: qty 2

## 2020-06-07 MED ORDER — FENTANYL CITRATE (PF) 100 MCG/2ML IJ SOLN: 25.0000 ug | INTRAMUSCULAR | Status: DC | PRN

## 2020-06-07 MED ORDER — MIDAZOLAM HCL 2 MG/2ML IJ SOLN
INTRAMUSCULAR | Status: AC
  Filled 2020-06-07: qty 2

## 2020-06-07 MED ORDER — CIPROFLOXACIN HCL 500 MG PO TABS: 500.0000 mg | ORAL_TABLET | Freq: Two times a day (BID) | ORAL | 0 refills | Status: DC

## 2020-06-07 MED ORDER — ACETAMINOPHEN 160 MG/5ML PO SOLN: 325.0000 mg | Freq: Once | ORAL | Status: DC | PRN

## 2020-06-07 MED ORDER — DEXAMETHASONE SODIUM PHOSPHATE 10 MG/ML IJ SOLN
INTRAMUSCULAR | Status: DC | PRN
  Administered 2020-06-07: 5 mg via INTRAVENOUS

## 2020-06-07 MED ORDER — SCOPOLAMINE 1 MG/3DAYS TD PT72
MEDICATED_PATCH | TRANSDERMAL | Status: AC
  Filled 2020-06-07: qty 1

## 2020-06-07 MED ORDER — SCOPOLAMINE 1 MG/3DAYS TD PT72: 1.0000 | MEDICATED_PATCH | TRANSDERMAL | Status: DC

## 2020-06-07 MED ORDER — IOHEXOL 300 MG/ML  SOLN
INTRAMUSCULAR | Status: DC | PRN
  Administered 2020-06-07: 5 mL via URETHRAL

## 2020-06-07 MED ORDER — KETOROLAC TROMETHAMINE 30 MG/ML IJ SOLN
INTRAMUSCULAR | Status: DC | PRN
  Administered 2020-06-07: 30 mg via INTRAVENOUS

## 2020-06-07 MED ORDER — DEXAMETHASONE SODIUM PHOSPHATE 10 MG/ML IJ SOLN
INTRAMUSCULAR | Status: AC
  Filled 2020-06-07: qty 1

## 2020-06-07 MED ORDER — FENTANYL CITRATE (PF) 100 MCG/2ML IJ SOLN
INTRAMUSCULAR | Status: DC | PRN
  Administered 2020-06-07: 50 ug via INTRAVENOUS
  Administered 2020-06-07 (×6): 25 ug via INTRAVENOUS

## 2020-06-07 MED ORDER — ACETAMINOPHEN 325 MG PO TABS: 325.0000 mg | ORAL_TABLET | Freq: Once | ORAL | Status: DC | PRN

## 2020-06-07 MED ORDER — FENTANYL CITRATE (PF) 100 MCG/2ML IJ SOLN
INTRAMUSCULAR | Status: AC
  Filled 2020-06-07: qty 2

## 2020-06-07 MED ORDER — OXYBUTYNIN CHLORIDE 5 MG PO TABS: 5.0000 mg | ORAL_TABLET | Freq: Three times a day (TID) | ORAL | 1 refills | Status: DC | PRN

## 2020-06-07 MED ORDER — AMISULPRIDE (ANTIEMETIC) 5 MG/2ML IV SOLN: 10.0000 mg | Freq: Once | INTRAVENOUS | Status: DC | PRN

## 2020-06-07 MED ORDER — DEXMEDETOMIDINE (PRECEDEX) IN NS 20 MCG/5ML (4 MCG/ML) IV SYRINGE
PREFILLED_SYRINGE | INTRAVENOUS | Status: AC
  Filled 2020-06-07: qty 5

## 2020-06-07 MED ORDER — SCOPOLAMINE 1 MG/3DAYS TD PT72
MEDICATED_PATCH | TRANSDERMAL | Status: DC | PRN
  Administered 2020-06-07: 1 via TRANSDERMAL

## 2020-06-07 MED ORDER — CIPROFLOXACIN IN D5W 400 MG/200ML IV SOLN
400.0000 mg | Freq: Once | INTRAVENOUS | Status: AC
  Administered 2020-06-07: 400 mg via INTRAVENOUS

## 2020-06-07 MED ORDER — ACETAMINOPHEN 10 MG/ML IV SOLN: 1000.0000 mg | Freq: Once | INTRAVENOUS | Status: DC | PRN

## 2020-06-07 MED ORDER — PROPOFOL 10 MG/ML IV BOLUS
INTRAVENOUS | Status: AC
  Filled 2020-06-07: qty 20

## 2020-06-07 MED ORDER — LACTATED RINGERS IV SOLN: INTRAVENOUS | Status: DC

## 2020-06-07 MED ORDER — LIDOCAINE 2% (20 MG/ML) 5 ML SYRINGE
INTRAMUSCULAR | Status: AC
  Filled 2020-06-07: qty 5

## 2020-06-07 MED ORDER — SODIUM CHLORIDE 0.9 % IR SOLN
Status: DC | PRN
  Administered 2020-06-07: 1500 mL via INTRAVESICAL

## 2020-06-07 MED ORDER — KETOROLAC TROMETHAMINE 30 MG/ML IJ SOLN
INTRAMUSCULAR | Status: AC
  Filled 2020-06-07: qty 1

## 2020-06-07 MED ORDER — LIDOCAINE HCL (CARDIAC) PF 100 MG/5ML IV SOSY
PREFILLED_SYRINGE | INTRAVENOUS | Status: DC | PRN
  Administered 2020-06-07: 100 mg via INTRAVENOUS

## 2020-06-07 MED ORDER — MEPERIDINE HCL 25 MG/ML IJ SOLN: 6.2500 mg | INTRAMUSCULAR | Status: DC | PRN

## 2020-06-07 MED ORDER — PHENAZOPYRIDINE HCL 200 MG PO TABS: 200.0000 mg | ORAL_TABLET | Freq: Three times a day (TID) | ORAL | 0 refills | Status: DC | PRN

## 2020-06-07 MED ORDER — DEXMEDETOMIDINE HCL 200 MCG/2ML IV SOLN
INTRAVENOUS | Status: DC | PRN
  Administered 2020-06-07 (×2): 4 ug via INTRAVENOUS

## 2020-06-07 MED ORDER — CIPROFLOXACIN IN D5W 400 MG/200ML IV SOLN
INTRAVENOUS | Status: AC
  Filled 2020-06-07: qty 200

## 2020-06-07 MED ORDER — ONDANSETRON HCL 4 MG/2ML IJ SOLN
INTRAMUSCULAR | Status: DC | PRN
  Administered 2020-06-07: 8 mg via INTRAVENOUS

## 2020-06-07 MED FILL — HYDROCODON-APAP 5-325: 5-325 | 3 days supply | Qty: 20 | Fill #0

## 2020-06-07 MED FILL — PHENAZOPYRIDINE 200 MG TAB: 200 | 10 days supply | Qty: 30 | Fill #0

## 2020-06-07 MED FILL — CIPROFLOXACIN HCL 500 MG TA: 500 | 3 days supply | Qty: 6 | Fill #0

## 2020-06-07 MED FILL — OXYBUTYNIN CHLORIDE 5 MG TA: 5 | 10 days supply | Qty: 30 | Fill #0

## 2020-06-07 SURGICAL SUPPLY — 25 items
APL SKNCLS STERI-STRIP NONHPOA (GAUZE/BANDAGES/DRESSINGS)
BAG DRAIN URO-CYSTO SKYTR STRL (DRAIN) ×2 IMPLANT
BAG DRN UROCATH (DRAIN) ×1
BASKET STONE 1.7 NGAGE (UROLOGICAL SUPPLIES) IMPLANT
BASKET ZERO TIP NITINOL 2.4FR (BASKET) ×2 IMPLANT
BENZOIN TINCTURE PRP APPL 2/3 (GAUZE/BANDAGES/DRESSINGS) IMPLANT
BSKT STON RTRVL ZERO TP 2.4FR (BASKET) ×1
CATH URET 5FR 28IN OPEN ENDED (CATHETERS) ×2 IMPLANT
CLOTH BEACON ORANGE TIMEOUT ST (SAFETY) ×2 IMPLANT
FIBER LASER FLEXIVA 365 (UROLOGICAL SUPPLIES) IMPLANT
FIBER LASER TRAC TIP (UROLOGICAL SUPPLIES) ×2 IMPLANT
GLOVE BIO SURGEON STRL SZ7.5 (GLOVE) ×2 IMPLANT
GOWN STRL REUS W/TWL XL LVL3 (GOWN DISPOSABLE) ×2 IMPLANT
GUIDEWIRE STR DUAL SENSOR (WIRE) ×4 IMPLANT
GUIDEWIRE ZIPWRE .038 STRAIGHT (WIRE) ×2 IMPLANT
IV NS IRRIG 3000ML ARTHROMATIC (IV SOLUTION) ×2 IMPLANT
KIT TURNOVER CYSTO (KITS) ×2 IMPLANT
MANIFOLD NEPTUNE II (INSTRUMENTS) ×2 IMPLANT
NS IRRIG 500ML POUR BTL (IV SOLUTION) ×2 IMPLANT
PACK CYSTO (CUSTOM PROCEDURE TRAY) ×2 IMPLANT
STENT URET 6FRX24 CONTOUR (STENTS) ×2 IMPLANT
STRIP CLOSURE SKIN 1/2X4 (GAUZE/BANDAGES/DRESSINGS) IMPLANT
SYR 10ML LL (SYRINGE) ×2 IMPLANT
TUBE CONNECTING 12X1/4 (SUCTIONS) ×2 IMPLANT
TUBING UROLOGY SET (TUBING) ×2 IMPLANT

## 2020-06-07 NOTE — Anesthesia Procedure Notes (Signed)
Procedure Name: LMA Insertion Date/Time: 06/07/2020 12:44 PM Performed by: Justice Rocher, CRNA Pre-anesthesia Checklist: Patient identified, Emergency Drugs available, Suction available, Patient being monitored and Timeout performed Patient Re-evaluated:Patient Re-evaluated prior to induction Oxygen Delivery Method: Circle system utilized Preoxygenation: Pre-oxygenation with 100% oxygen Induction Type: IV induction Ventilation: Mask ventilation without difficulty LMA: LMA inserted LMA Size: 4.0 Number of attempts: 1 Airway Equipment and Method: Bite block Placement Confirmation: positive ETCO2,  CO2 detector and breath sounds checked- equal and bilateral Tube secured with: Tape Dental Injury: Teeth and Oropharynx as per pre-operative assessment

## 2020-06-07 NOTE — Transfer of Care (Signed)
Immediate Anesthesia Transfer of Care Note  Patient: Jamie Hall  Procedure(s) Performed: Procedure(s) (LRB): CYSTOSCOPY/RETROGRADE/URETEROSCOPY/HOLMIUM LASER/STENT PLACEMENT (Left)  Patient Location: PACU  Anesthesia Type: General  Level of Consciousness: awake, sedated, patient cooperative and responds to stimulation  Airway & Oxygen Therapy: Patient Spontanous Breathing and Patient connected to [place] 02 and soft FM   Post-op Assessment: Report given to PACU RN, Post -op Vital signs reviewed and stable and Patient moving all extremities  Post vital signs: Reviewed and stable  Complications: No apparent anesthesia complications

## 2020-06-07 NOTE — Op Note (Addendum)
Operative Note  Preoperative diagnosis:  1.  7 mm left distal ureteral calculus  Postoperative diagnosis: 1.  7 mm left distal ureteral calculus 2.  2 cm stenotic segment of the left distal ureter  Procedure(s): 1.  Cystoscopy with left ureteroscopy, holmium laser lithotripsy and left JJ stent placement 2.  Left retrograde pyelogram with intraoperative interpretation of fluoroscopic imaging  Surgeon: Ellison Hughs, MD  Assistants:  None  Anesthesia:  General  Complications:  None  EBL: Less than 5 mL  Specimens: 1.  Left ureteral stone fragments  Drains/Catheters: 1.  Left 6 French, 24 cm JJ stent without tether  Intraoperative findings:   1. Left retrograde pyelogram revealed a slightly stenotic area measuring approximately 2 cm involving the distal aspects of the left ureter.  There was no appreciable dilation of the proximal left ureter or left renal pelvis.  There were no filling defects seen within the remainder of the left collecting system.  Indication:  Jamie Hall is a 54 y.o. female with a 7 mm left distal ureteral calculus seen on CT from October.  She has been consented for the above procedures, voices understanding and wishes to proceed.  Description of procedure:  After informed consent was obtained, the patient was brought to the operating room and general LMA anesthesia was administered. The patient was then placed in the dorsolithotomy position and prepped and draped in the usual sterile fashion. A timeout was performed. A 23 French rigid cystoscope was then inserted into the urethral meatus and advanced into the bladder under direct vision. A complete bladder survey revealed no intravesical pathology.  A 5 French ureteral catheter was then inserted into the left ureteral orifice and a retrograde pyelogram was obtained, with the findings listed above.  A Glidewire was then used to intubate the lumen of the ureteral catheter and was advanced up to the  left renal pelvis, under fluoroscopic guidance.  The catheter was then removed, leaving the wire in place.  A semirigid ureteroscope was then advanced into the left ureter.  There was a slightly stenotic area that measured approximately 2 cm involving the most distal aspects of the left ureter.  An additional sensor wire was used to safely bypass this area of stenosis.  Her ureteral calculus was just beyond the area of stenosis.  A 200 m holmium laser was then used to fracture the stone into numerous smaller pieces that were then extracted using a 0 tip basket.  The semirigid ureteroscope was then removed and a 6 [place], 24 cm JJ stent was then advanced over the wire and into good position within the left collecting system, confirming placement via fluoroscopy.  The stone fragments within the bladder were removed and the bladder was drained.  She tolerated the procedure well and was transferred to the postanesthesia in stable condition.  Plan: Follow-up in 1 week for office cystoscopy and stent removal.  Follow-up with Dr. Abner Greenspan in 6 weeks for left renal ultrasound.

## 2020-06-07 NOTE — Anesthesia Postprocedure Evaluation (Signed)
Anesthesia Post Note  Patient: NETANYA YAZDANI  Procedure(s) Performed: CYSTOSCOPY/RETROGRADE/URETEROSCOPY/HOLMIUM LASER/STENT PLACEMENT (Left Renal)     Patient location during evaluation: PACU Anesthesia Type: General Level of consciousness: awake and alert Pain management: pain level controlled Vital Signs Assessment: post-procedure vital signs reviewed and stable Respiratory status: spontaneous breathing, nonlabored ventilation, respiratory function stable and patient connected to nasal cannula oxygen Cardiovascular status: blood pressure returned to baseline and stable Postop Assessment: no apparent nausea or vomiting Anesthetic complications: no   No complications documented.  Last Vitals:  Vitals:   06/07/20 1324 06/07/20 1330  BP: 126/82 127/67  Pulse: 74 69  Resp: 12 14  Temp: 36.6 C   SpO2: 92% 92%    Last Pain:  Vitals:   06/07/20 1345  TempSrc:   PainSc: 0-No pain                 Effie Berkshire

## 2020-06-07 NOTE — Anesthesia Preprocedure Evaluation (Addendum)
Anesthesia Evaluation  Patient identified by MRN, date of birth, ID band Patient awake    Reviewed: Allergy & Precautions, NPO status , Patient's Chart, lab work & pertinent test results  History of Anesthesia Complications (+) PONV and history of anesthetic complications  Airway Mallampati: II  TM Distance: >3 FB Neck ROM: Full    Dental  (+) Teeth Intact, Dental Advisory Given   Pulmonary neg pulmonary ROS,    breath sounds clear to auscultation       Cardiovascular hypertension, Pt. on medications  Rhythm:Regular Rate:Normal     Neuro/Psych  Headaches, negative psych ROS   GI/Hepatic Neg liver ROS, GERD  Medicated,  Endo/Other  diabetes, Type 2, Oral Hypoglycemic Agents  Renal/GU negative Renal ROS     Musculoskeletal negative musculoskeletal ROS (+)   Abdominal   Peds  Hematology negative hematology ROS (+)   Anesthesia Other Findings   Reproductive/Obstetrics                            Anesthesia Physical Anesthesia Plan  ASA: III  Anesthesia Plan: General   Post-op Pain Management:    Induction: Intravenous  PONV Risk Score and Plan: 4 or greater and Dexamethasone, Midazolam, Scopolamine patch - Pre-op and Ondansetron  Airway Management Planned: LMA  Additional Equipment: None  Intra-op Plan:   Post-operative Plan: Extubation in OR  Informed Consent: I have reviewed the patients History and Physical, chart, labs and discussed the procedure including the risks, benefits and alternatives for the proposed anesthesia with the patient or authorized representative who has indicated his/her understanding and acceptance.     Dental advisory given  Plan Discussed with: CRNA  Anesthesia Plan Comments:        Anesthesia Quick Evaluation

## 2020-06-07 NOTE — H&P (Signed)
Urology Preoperative H&P   Chief Complaint: Left flank pain  History of Present Illness: Jamie Hall is a 54 y.o. female, initially seen by Dr. Abner Greenspan, with a 7 mm left distal ureteral calculus seen on CT from 05/02/2020.  Despite MET, the patient has been unable to pass her stone and continues to have periodic flank pain.  She denies N/V/F/C, dysuria or hematuria.  UA from 05/13/20 showed no signs of acute cystitis.   Past Medical History:  Diagnosis Date  . DM type 2 (diabetes mellitus, type 2) ([place])   . GERD (gastroesophageal reflux disease)   . Headache   . History of kidney stones   . Hypertension   . PONV (postoperative nausea and vomiting)     Past Surgical History:  Procedure Laterality Date  . BREAST REDUCTION SURGERY  2006  . CARPECTOMY Left 2007   proximal row, wrist  . GANGLION CYST EXCISION Right 1999   wrist  . KNEE ARTHROSCOPY Right 2000  . TONSILLECTOMY  age 67    Allergies:  Allergies  Allergen Reactions  . Bee Venom     Other reaction(s): EDEMA  . Benzoyl Peroxide Other (See Comments)    Makes eyes shut  . Ceftin [Cefuroxime] Swelling    Throat swelling  . Clindamycin/Lincomycin Hives  . Codeine Hives  . Hydrocodone     itching  . Oxycodone-Acetaminophen Hives  . Penicillins Hives  . Tape Other (See Comments)    Blisters all adhesives likes paper tape    Family History  Problem Relation Age of Onset  . Early death Mother   . Stroke Mother   . Healthy Father   . Healthy Sister   . Healthy Daughter   . Cancer Maternal Grandfather   . Drug abuse Maternal Grandfather   . Hypertension Maternal Grandfather   . Colon cancer Neg Hx   . Esophageal cancer Neg Hx   . Liver cancer Neg Hx   . Pancreatic cancer Neg Hx   . Rectal cancer Neg Hx   . Stomach cancer Neg Hx     Social History:  reports that she has never smoked. She has never used smokeless tobacco. She reports previous alcohol use. She reports previous drug use.  ROS: A complete review  of systems was performed.  All systems are negative except for pertinent findings as noted.  Physical Exam:  Vital signs in last 24 hours:   Constitutional:  Alert and oriented, No acute distress Cardiovascular: Regular rate and rhythm, No JVD Respiratory: Normal respiratory effort, Lungs clear bilaterally GI: Abdomen is soft, nontender, nondistended, no abdominal masses GU: No CVA tenderness Lymphatic: No lymphadenopathy Neurologic: Grossly intact, no focal deficits Psychiatric: Normal mood and affect  Laboratory Data:  No results for input(s): WBC, HGB, HCT, PLT in the last 72 hours.  No results for input(s): NA, K, CL, GLUCOSE, BUN, CALCIUM, CREATININE in the last 72 hours.  Invalid input(s): CO3   Results for orders placed or performed during the hospital encounter of 06/06/20 (from the past 24 hour(s))  SARS CORONAVIRUS 2 (TAT 6-24 HRS) Nasopharyngeal Nasopharyngeal Swab     Status: None   Collection Time: 06/06/20  8:28 AM   Specimen: Nasopharyngeal Swab  Result Value Ref Range   SARS Coronavirus 2 NEGATIVE NEGATIVE   Recent Results (from the past 240 hour(s))  SARS CORONAVIRUS 2 (TAT 6-24 HRS) Nasopharyngeal Nasopharyngeal Swab     Status: None   Collection Time: 06/06/20  8:28 AM   Specimen: Nasopharyngeal  Swab  Result Value Ref Range Status   SARS Coronavirus 2 NEGATIVE NEGATIVE Final    Comment: (NOTE) SARS-CoV-2 target nucleic acids are NOT DETECTED.  The SARS-CoV-2 RNA is generally detectable in upper and lower respiratory specimens during the acute phase of infection. Negative results do not preclude SARS-CoV-2 infection, do not rule out co-infections with other pathogens, and should not be used as the sole basis for treatment or other patient management decisions. Negative results must be combined with clinical observations, patient history, and epidemiological information. The expected result is Negative.  Fact Sheet for  Patients: SugarRoll.be  Fact Sheet for Healthcare Providers: https://www.woods-mathews.com/  This test is not yet approved or cleared by the [place] FDA and  has been authorized for detection and/or diagnosis of SARS-CoV-2 by FDA under an Emergency Use Authorization (EUA). This EUA will remain  in effect (meaning this test can be used) for the duration of the COVID-19 declaration under Se ction 564(b)(1) of the Act, 21 U.S.C. section 360bbb-3(b)(1), unless the authorization is terminated or revoked sooner.  Performed at [facility], [address], [place], [place] [address]     Renal Function: No results for input(s): CREATININE in the last 168 hours. CrCl cannot be calculated (Patient's most recent lab result is older than the maximum 21 days allowed.).  Radiologic Imaging: CLINICAL DATA: Pt presents with c/o left lower quadrant abdominal  pain. Pt reports she has a hx of kidney stones. Pt has some blood in  her urine but reports that this is normal for her. Pt is diaphoretic  during triage, pain 10/10.   EXAM:  CT ABDOMEN AND PELVIS WITHOUT CONTRAST   TECHNIQUE:  Multidetector CT imaging of the abdomen and pelvis was performed  following the standard protocol without IV contrast.   COMPARISON: CT abdomen pelvis 10/23/2015   FINDINGS:  Lower chest: No acute abnormality.   Evaluation of the abdominal viscera somewhat limited by the lack of  IV contrast.   Hepatobiliary: No focal liver abnormality is seen. No gallstones,  gallbladder wall thickening, or biliary dilatation.   Pancreas: Unremarkable.   Spleen: Normal in size.   Adrenals/Urinary Tract: Normal right adrenal gland. Stable 1.1 cm  nodule in the left adrenal gland, most likely an adenoma. Left renal  cysts. Mild left hydronephrosis and hydroureter secondary to a 7 mm  calculus in the distal left ureter. No right hydronephrosis or renal  calculi.  Unremarkable urinary bladder.   Stomach/Bowel: Stomach is within normal limits. No evidence of bowel  wall thickening, distention, or inflammatory changes. Colonic  diverticula.   Vascular/Lymphatic: No significant vascular findings are present. No  enlarged abdominal or pelvic lymph nodes.   Reproductive: IUD is present within the uterus. No adnexal masses.   Other: No abdominal wall hernia or abnormality. No abdominopelvic  ascites.   Musculoskeletal: No acute or significant osseous findings.   IMPRESSION:  Mild left hydronephrosis and hydroureter secondary to a 7 mm  calculus in the distal left ureter.    Electronically Signed  By: Audie Pinto M.D.  On: 05/02/2020 16:11  I independently reviewed the above imaging studies.  Assessment and Plan Jamie Hall is a 54 y.o. female with a 7 mm left distal ureteral stone associated with flank pain  The risks, benefits and alternatives of cystoscopy with LEFT ureteroscopy, laser lithotripsy and ureteral stent placement was discussed the patient.  Risks included, but are not limited to: bleeding, urinary tract infection, ureteral injury/avulsion, ureteral stricture formation,  retained stone fragments, the possibility that multiple surgeries may be required to treat the stone(s), MI, stroke, PE and the inherent risks of general anesthesia.  The patient voices understanding and wishes to proceed.      Ellison Hughs, MD 06/07/2020, 8:22 AM  [facility] Pager: [phone]

## 2020-06-07 NOTE — Discharge Instructions (Signed)
CYSTOSCOPY HOME CARE INSTRUCTIONS  Activity: Rest for the remainder of the day.  Do not drive or operate equipment today.  You may resume normal activities in one to two days as instructed by your physician.   Meals: Drink plenty of liquids and eat light foods such as gelatin or soup this evening.  You may return to a normal meal plan tomorrow.  Return to Work: You may return to work in one to two days or as instructed by your physician.  Special Instructions / Symptoms: Call your physician if any of these symptoms occur:   -persistent or heavy bleeding  -bleeding which continues after first few urination  -large blood clots that are difficult to pass  -urine stream diminishes or stops completely  -fever equal to or higher than 101 degrees Farenheit.  -cloudy urine with a strong, foul odor  -severe pain  Females should always wipe from front to back after elimination.  You may feel some burning pain when you urinate.  This should disappear with time.  Applying moist heat to the lower abdomen or a hot tub bath may help relieve the pain. \  Follow-Up / Date of Return Visit to Your Physician:  As instructed Call for an appointment to arrange follow-up.   Post Anesthesia Home Care Instructions  Activity: Get plenty of rest for the remainder of the day. A responsible individual must stay with you for 24 hours following the procedure.  For the next 24 hours, DO NOT: -Drive a car -Operate machinery -Drink alcoholic beverages -Take any medication unless instructed by your physician -Make any legal decisions or sign important papers.  Meals: Start with liquid foods such as gelatin or soup. Progress to regular foods as tolerated. Avoid greasy, spicy, heavy foods. If nausea and/or vomiting occur, drink only clear liquids until the nausea and/or vomiting subsides. Call your physician if vomiting continues.  Special Instructions/Symptoms: Your throat may feel dry or sore from the  anesthesia or the breathing tube placed in your throat during surgery. If this causes discomfort, gargle with warm salt water. The discomfort should disappear within 24 hours.  If you had a scopolamine patch placed behind your ear for the management of post- operative nausea and/or vomiting:  1. The medication in the patch is effective for 72 hours, after which it should be removed.  Wrap patch in a tissue and discard in the trash. Wash hands thoroughly with soap and water. 2. You may remove the patch earlier than 72 hours if you experience unpleasant side effects which may include dry mouth, dizziness or visual disturbances. 3. Avoid touching the patch. Wash your hands with soap and water after contact with the patch.        

## 2020-06-11 ENCOUNTER — Encounter (HOSPITAL_BASED_OUTPATIENT_CLINIC_OR_DEPARTMENT_OTHER): Payer: Self-pay | Admitting: Urology

## 2020-06-14 DIAGNOSIS — N201 Calculus of ureter: Secondary | ICD-10-CM | POA: Diagnosis not present

## 2020-06-25 ENCOUNTER — Other Ambulatory Visit: Payer: Self-pay | Admitting: Family Medicine

## 2020-06-25 ENCOUNTER — Other Ambulatory Visit: Payer: Self-pay

## 2020-06-25 DIAGNOSIS — E785 Hyperlipidemia, unspecified: Secondary | ICD-10-CM

## 2020-06-25 DIAGNOSIS — E1169 Type 2 diabetes mellitus with other specified complication: Secondary | ICD-10-CM

## 2020-06-25 MED ORDER — ATORVASTATIN CALCIUM 10 MG PO TABS: 10.0000 mg | ORAL_TABLET | Freq: Every day | ORAL | 0 refills | Status: DC

## 2020-06-25 MED FILL — ATORVASTATIN CALCIUM 10 MG: 10 | 90 days supply | Qty: 90 | Fill #0

## 2020-06-28 SURGERY — Surgical Case
Anesthesia: *Unknown

## 2020-07-02 ENCOUNTER — Other Ambulatory Visit: Payer: Self-pay

## 2020-07-02 ENCOUNTER — Ambulatory Visit: Payer: 59 | Admitting: Family Medicine

## 2020-07-02 ENCOUNTER — Encounter: Payer: Self-pay | Admitting: Family Medicine

## 2020-07-02 VITALS — BP 118/78 | HR 67 | Temp 97.6°F | Resp 16 | Ht 63.0 in | Wt 235.4 lb

## 2020-07-02 DIAGNOSIS — E119 Type 2 diabetes mellitus without complications: Secondary | ICD-10-CM

## 2020-07-02 LAB — HEMOGLOBIN A1C: Hgb A1c MFr Bld: 7.3 % — ABNORMAL HIGH (ref 4.6–6.5)

## 2020-07-02 LAB — BASIC METABOLIC PANEL
BUN: 11 mg/dL (ref 6–23)
CO2: 31 mEq/L (ref 19–32)
Calcium: 9.2 mg/dL (ref 8.4–10.5)
Chloride: 102 mEq/L (ref 96–112)
Creatinine, Ser: 0.7 mg/dL (ref 0.40–1.20)
GFR: 99.39 mL/min (ref >60.00)
Glucose, Bld: 131 mg/dL — ABNORMAL HIGH (ref 70–99)
Potassium: 4.2 mEq/L (ref 3.5–5.1)
Sodium: 139 mEq/L (ref 135–145)

## 2020-07-02 NOTE — Progress Notes (Signed)
   Subjective:    Patient ID: Jamie Hall, female    DOB: [birth year], 54 y.o.   MRN: [id number]  HPI DM- chronic problem, on Metformin 500mg  BID.  UTD on eye exam, foot exam, on ACE for renal protection.  Weight is stable.  No CP, SOB, HAs, visual changes, abd pain, N/V.  No weakness/numbness hands/feet.  Denies symptomatic lows.  No sores or blisters on feet.   Review of Systems For ROS see HPI   This visit occurred during the SARS-CoV-2 public health emergency.  Safety protocols were in place, including screening questions prior to the visit, additional usage of staff PPE, and extensive cleaning of exam room while observing appropriate contact time as indicated for disinfecting solutions.       Objective:   Physical Exam Vitals reviewed.  Constitutional:      General: She is not in acute distress.    Appearance: Normal appearance. She is well-developed. She is obese.  HENT:     Head: Normocephalic and atraumatic.  Eyes:     Conjunctiva/sclera: Conjunctivae normal.     Pupils: Pupils are equal, round, and reactive to light.  Neck:     Thyroid: No thyromegaly.  Cardiovascular:     Rate and Rhythm: Normal rate and regular rhythm.     Heart sounds: Normal heart sounds. No murmur heard.   Pulmonary:     Effort: Pulmonary effort is normal. No respiratory distress.     Breath sounds: Normal breath sounds.  Abdominal:     General: There is no distension.     Palpations: Abdomen is soft.     Tenderness: There is no abdominal tenderness.  Musculoskeletal:     Cervical back: Normal range of motion and neck supple.     Right lower leg: No edema.     Left lower leg: No edema.  Lymphadenopathy:     Cervical: No cervical adenopathy.  Skin:    General: Skin is warm and dry.  Neurological:     Mental Status: She is alert and oriented to person, place, and time.  Psychiatric:        Behavior: Behavior normal.           Assessment & Plan:

## 2020-07-02 NOTE — Patient Instructions (Signed)
Follow up in 3-4 months to recheck diabetes, cholesterol, BP We'll notify you of your lab results and make any changes if needed Continue to work on healthy diet and regular exercise- you can do it! Call and schedule your eye exam for January Call with any questions or concerns Stay Safe!  Stay Healthy! Happy Holidays!!!

## 2020-07-02 NOTE — Assessment & Plan Note (Signed)
Chronic problem, on Metformin 500mg  BID.  UTD on foot exam, eye exam.  On ACE for renal protection.  Stressed need for healthy diet and regular exercise.  Check labs.  Adjust meds prn

## 2020-07-24 ENCOUNTER — Other Ambulatory Visit (HOSPITAL_COMMUNITY): Payer: Self-pay | Admitting: Chiropractic Medicine

## 2020-07-24 ENCOUNTER — Other Ambulatory Visit: Payer: Self-pay

## 2020-07-24 ENCOUNTER — Ambulatory Visit (HOSPITAL_COMMUNITY)
Admission: RE | Admit: 2020-07-24 | Discharge: 2020-07-24 | Disposition: A | Discharge status: Home / Self Care | Payer: 59 | Source: Ambulatory Visit | Attending: Chiropractic Medicine | Admitting: Chiropractic Medicine

## 2020-07-24 DIAGNOSIS — M25561 Pain in right knee: Secondary | ICD-10-CM | POA: Diagnosis not present

## 2020-07-24 DIAGNOSIS — G8929 Other chronic pain: Secondary | ICD-10-CM

## 2020-07-30 DIAGNOSIS — N281 Cyst of kidney, acquired: Secondary | ICD-10-CM | POA: Diagnosis not present

## 2020-07-30 DIAGNOSIS — N201 Calculus of ureter: Secondary | ICD-10-CM | POA: Diagnosis not present

## 2020-07-31 ENCOUNTER — Other Ambulatory Visit: Payer: Self-pay | Admitting: Family Medicine

## 2020-07-31 MED FILL — LISINOPRIL 10 MG TABS: 10 | 90 days supply | Qty: 90 | Fill #0

## 2020-08-06 ENCOUNTER — Ambulatory Visit (INDEPENDENT_AMBULATORY_CARE_PROVIDER_SITE_OTHER): Payer: 59 | Admitting: Orthopaedic Surgery

## 2020-08-06 VITALS — Ht 63.0 in | Wt 235.0 lb

## 2020-08-06 DIAGNOSIS — S83241D Other tear of medial meniscus, current injury, right knee, subsequent encounter: Secondary | ICD-10-CM | POA: Diagnosis not present

## 2020-08-06 NOTE — Progress Notes (Signed)
Office Visit Note   Patient: Jamie Hall           Date of Birth: 1965/11/16           MRN: [id number] Visit Date: 08/06/2020              Requested by: Midge Minium, MD [address] [place],  [place] [address] PCP: Midge Minium, MD   Assessment & Plan: Visit Diagnoses:  1. Acute medial meniscal tear, right, subsequent encounter     Plan: I have recommended a right knee arthroscopy with a partial medial meniscectomy based on the MRI findings and clinical exam findings.  I did explain the risk and benefits of this type of surgery and explained the intraoperative and postoperative course.  She understands this could lead to worsening arthritic changes due to losing some of the resiliency and shock absorbant capacity of the knee.  Given her continued mechanical symptoms she still does wish to proceed with surgery and I agree with this as well given the MRI findings and her mechanical symptoms.  The interoperative and postoperative course were explained in detail.  She does wish to have this scheduled in the near future.  We would then see her back in 1 week postoperative.  All questions and concerns were answered and addressed.  Follow-Up Instructions: Return for 1 week post-op.   Orders:  No orders of the defined types were placed in this encounter.  No orders of the defined types were placed in this encounter.     Procedures: No procedures performed   Clinical Data: No additional findings.   debiSubjective: Chief Complaint  Patient presents with  . 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  She comes for evaluation treatment of a right knee known medial meniscal tear.  She has a recent MRI of the right knee showing a horizontal tear of the medial meniscus.  She has been having locking and catching.  Pivoting activities hurt quite a bit with her right knee.  She has remote history 20 years ago of a knee  arthroscopy due to "blowing out the ligaments of her knee" when I believe she was skiing.  She had done well for a long period of time but then started developing this more recent locking and catching with her knee.  She has tried and failed conservative treatment thus a MRI was obtained showing the meniscal tear of her right knee.  She is sent here for surgical evaluation of this meniscal tear.  She is a diabetic but under good control.  HPI  Review of Systems There is currently no headache, chest pain, shortness of breath, fever, chills, nausea, vomiting  Objective: Vital Signs: Ht 5\' 3"  (1.6 m)   Wt 235 lb (106.6 kg)   BMI 41.63 kg/m   Physical Exam She is alert and orient x3 and in no acute distress Ortho Exam  examination of her right knee shows posterior medial tenderness and a positive McMurray's exam to the medial compartment of the knee.  Her range of motion is otherwise full.  It does slightly lock when I bring her from full flexion to hyperextension in her back. Specialty Comments:  No specialty comments available.  Imaging: No results found. The MRI is reviewed of her right knee and it does show fluid and signal changes within the posterior horn and mid body of the medial meniscus consistent with a  horizontal medial meniscal tear.  There is thinning of the cartilage throughout the knee but no full-thickness cartilage defects.  The ligamentous structures and lateral meniscus are all intact.  PMFS History: Patient Active Problem List   Diagnosis Date Noted  . Hyperlipidemia associated with type 2 diabetes mellitus ([place]) 08/18/2019  . Physical exam 02/03/2019  . Diabetes mellitus without complication ([place]) 84/16/6063  . Morbid obesity ([place]) 01/17/2018  . HTN (hypertension) 01/17/2018  . Cluster headaches 01/17/2018  . Basal cell carcinoma of skin 02/18/2012   Past Medical History:  Diagnosis Date  . DM type 2 (diabetes mellitus, type 2) ([place])   . GERD (gastroesophageal  reflux disease)   . Headache   . History of kidney stones   . Hypertension   . PONV (postoperative nausea and vomiting)     Family History  Problem Relation Age of Onset  . Early death Mother   . Stroke Mother   . Healthy Father   . Healthy Sister   . Healthy Daughter   . Cancer Maternal Grandfather   . Drug abuse Maternal Grandfather   . Hypertension Maternal Grandfather   . Colon cancer Neg Hx   . Esophageal cancer Neg Hx   . Liver cancer Neg Hx   . Pancreatic cancer Neg Hx   . Rectal cancer Neg Hx   . Stomach cancer Neg Hx     Past Surgical History:  Procedure Laterality Date  . BREAST REDUCTION SURGERY  2006  . CARPECTOMY Left 2007   proximal row, wrist  . CYSTOSCOPY/URETEROSCOPY/HOLMIUM LASER/STENT PLACEMENT Left 06/07/2020   Procedure: CYSTOSCOPY/RETROGRADE/URETEROSCOPY/HOLMIUM LASER/STENT PLACEMENT;  Surgeon: Ceasar Mons, MD;  Location: [facility];  Service: Urology;  Laterality: Left;  ONLY NEEDS 45 MIN  . GANGLION CYST EXCISION Right 1999   wrist  . KIDNEY STONE SURGERY  06/07/2020  . KNEE ARTHROSCOPY Right 2000  . TONSILLECTOMY  age 73   Social History   Occupational History  . Not on file  Tobacco Use  . Smoking status: Never Smoker  . Smokeless tobacco: Never Used  Vaping Use  . Vaping Use: Never used  Substance and Sexual Activity  . Alcohol use: Not Currently  . Drug use: Not Currently  . Sexual activity: Yes    Birth control/protection: I.U.D.

## 2020-08-12 ENCOUNTER — Ambulatory Visit: Payer: 59 | Admitting: Orthopaedic Surgery

## 2020-08-14 ENCOUNTER — Encounter (HOSPITAL_BASED_OUTPATIENT_CLINIC_OR_DEPARTMENT_OTHER): Payer: Self-pay | Admitting: Orthopaedic Surgery

## 2020-08-14 ENCOUNTER — Other Ambulatory Visit: Payer: Self-pay

## 2020-08-14 ENCOUNTER — Other Ambulatory Visit: Payer: Self-pay | Admitting: Physician Assistant

## 2020-08-14 ENCOUNTER — Other Ambulatory Visit (HOSPITAL_COMMUNITY)
Admission: RE | Admit: 2020-08-14 | Discharge: 2020-08-14 | Disposition: A | Discharge status: Home / Self Care | Payer: 59 | Source: Ambulatory Visit | Attending: Orthopaedic Surgery | Admitting: Orthopaedic Surgery

## 2020-08-14 DIAGNOSIS — Z20822 Contact with and (suspected) exposure to covid-19: Secondary | ICD-10-CM | POA: Diagnosis not present

## 2020-08-14 DIAGNOSIS — Z01818 Encounter for other preprocedural examination: Secondary | ICD-10-CM | POA: Insufficient documentation

## 2020-08-14 LAB — SARS CORONAVIRUS 2 (TAT 6-24 HRS): SARS Coronavirus 2: NEGATIVE

## 2020-08-14 NOTE — Progress Notes (Signed)
Spoke w/ via phone for pre-op interview--- PT Lab needs dos---- [place] (per anes)/  Pre-op orders pending              Lab results------ current ekg in epic COVID test ------ done 08-14-2020 result in epic Arrive at ------- 0700 NPO after MN NO Solid Food.  Clear liquids from MN until--- 0600 Medications to take morning of surgery ----- Prilosec Diabetic medication ----- do not take metformin morning of surgery Patient Special Instructions ----- n/a Pre-Op special Istructions ----- sent inbox message in epic to dr c. Ninfa Linden , requested pre-op orders Patient verbalized understanding of instructions that were given at this phone interview. Patient denies shortness of breath, chest pain, fever, cough at this phone interview.

## 2020-08-15 MED ORDER — VANCOMYCIN HCL 1500 MG/300ML IV SOLN
1500.0000 mg | INTRAVENOUS | Status: AC
  Administered 2020-08-16: 1500 mg via INTRAVENOUS
  Filled 2020-08-15 (×2): qty 300

## 2020-08-16 ENCOUNTER — Ambulatory Visit (HOSPITAL_BASED_OUTPATIENT_CLINIC_OR_DEPARTMENT_OTHER): Payer: 59 | Admitting: Anesthesiology

## 2020-08-16 ENCOUNTER — Encounter (HOSPITAL_BASED_OUTPATIENT_CLINIC_OR_DEPARTMENT_OTHER): Payer: Self-pay | Admitting: Orthopaedic Surgery

## 2020-08-16 ENCOUNTER — Ambulatory Visit (HOSPITAL_BASED_OUTPATIENT_CLINIC_OR_DEPARTMENT_OTHER)
Admission: RE | Admit: 2020-08-16 | Discharge: 2020-08-16 | Disposition: A | Discharge status: Home / Self Care | Payer: 59 | Attending: Orthopaedic Surgery | Admitting: Orthopaedic Surgery

## 2020-08-16 ENCOUNTER — Encounter (HOSPITAL_BASED_OUTPATIENT_CLINIC_OR_DEPARTMENT_OTHER)
Admission: RE | Disposition: A | Discharge status: Home / Self Care | Payer: Self-pay | Source: Home / Self Care | Attending: Orthopaedic Surgery

## 2020-08-16 ENCOUNTER — Other Ambulatory Visit: Payer: Self-pay

## 2020-08-16 ENCOUNTER — Other Ambulatory Visit: Payer: Self-pay | Admitting: Orthopaedic Surgery

## 2020-08-16 DIAGNOSIS — Z888 Allergy status to other drugs, medicaments and biological substances status: Secondary | ICD-10-CM | POA: Insufficient documentation

## 2020-08-16 DIAGNOSIS — Z91030 Bee allergy status: Secondary | ICD-10-CM | POA: Insufficient documentation

## 2020-08-16 DIAGNOSIS — M94261 Chondromalacia, right knee: Secondary | ICD-10-CM | POA: Insufficient documentation

## 2020-08-16 DIAGNOSIS — Z885 Allergy status to narcotic agent status: Secondary | ICD-10-CM | POA: Diagnosis not present

## 2020-08-16 DIAGNOSIS — G8918 Other acute postprocedural pain: Secondary | ICD-10-CM | POA: Diagnosis not present

## 2020-08-16 DIAGNOSIS — S83241D Other tear of medial meniscus, current injury, right knee, subsequent encounter: Secondary | ICD-10-CM | POA: Diagnosis not present

## 2020-08-16 DIAGNOSIS — Z881 Allergy status to other antibiotic agents status: Secondary | ICD-10-CM | POA: Diagnosis not present

## 2020-08-16 DIAGNOSIS — S83241A Other tear of medial meniscus, current injury, right knee, initial encounter: Secondary | ICD-10-CM | POA: Diagnosis not present

## 2020-08-16 DIAGNOSIS — E119 Type 2 diabetes mellitus without complications: Secondary | ICD-10-CM | POA: Diagnosis not present

## 2020-08-16 DIAGNOSIS — Z7984 Long term (current) use of oral hypoglycemic drugs: Secondary | ICD-10-CM | POA: Insufficient documentation

## 2020-08-16 DIAGNOSIS — I10 Essential (primary) hypertension: Secondary | ICD-10-CM | POA: Diagnosis not present

## 2020-08-16 DIAGNOSIS — Z79899 Other long term (current) drug therapy: Secondary | ICD-10-CM | POA: Diagnosis not present

## 2020-08-16 DIAGNOSIS — M23221 Derangement of posterior horn of medial meniscus due to old tear or injury, right knee: Secondary | ICD-10-CM | POA: Insufficient documentation

## 2020-08-16 HISTORY — PX: KNEE ARTHROSCOPY: SHX127

## 2020-08-16 HISTORY — DX: Unspecified osteoarthritis, unspecified site: M19.90

## 2020-08-16 HISTORY — DX: Unspecified tear of unspecified meniscus, current injury, right knee, initial encounter: S83.206A

## 2020-08-16 LAB — POCT I-STAT, CHEM 8
BUN: 12 mg/dL (ref 6–20)
Calcium, Ion: 0.88 mmol/L — CL (ref 1.15–1.40)
Chloride: 107 mmol/L (ref 98–111)
Creatinine, Ser: 0.6 mg/dL (ref 0.44–1.00)
Glucose, Bld: 150 mg/dL — ABNORMAL HIGH (ref 70–99)
HCT: 41 % (ref 36.0–46.0)
Hemoglobin: 13.9 g/dL (ref 12.0–15.0)
Potassium: 4.2 mmol/L (ref 3.5–5.1)
Sodium: 136 mmol/L (ref 135–145)
TCO2: 25 mmol/L (ref 22–32)

## 2020-08-16 LAB — GLUCOSE, CAPILLARY: Glucose-Capillary: 129 mg/dL — ABNORMAL HIGH (ref 70–99)

## 2020-08-16 SURGERY — ARTHROSCOPY, KNEE
Anesthesia: Regional | Site: Knee | Laterality: Right

## 2020-08-16 MED ORDER — ONDANSETRON HCL 4 MG/2ML IJ SOLN
INTRAMUSCULAR | Status: AC
  Filled 2020-08-16: qty 2

## 2020-08-16 MED ORDER — DEXAMETHASONE SODIUM PHOSPHATE 10 MG/ML IJ SOLN
INTRAMUSCULAR | Status: AC
  Filled 2020-08-16: qty 1

## 2020-08-16 MED ORDER — HYDROCODONE-ACETAMINOPHEN 5-325 MG PO TABS: 1.0000 | ORAL_TABLET | Freq: Four times a day (QID) | ORAL | 0 refills | Status: DC | PRN

## 2020-08-16 MED ORDER — SCOPOLAMINE 1 MG/3DAYS TD PT72
1.0000 | MEDICATED_PATCH | TRANSDERMAL | Status: DC
  Administered 2020-08-16: 1.5 mg via TRANSDERMAL

## 2020-08-16 MED ORDER — FENTANYL CITRATE (PF) 100 MCG/2ML IJ SOLN
INTRAMUSCULAR | Status: AC
  Filled 2020-08-16: qty 2

## 2020-08-16 MED ORDER — PROMETHAZINE HCL 25 MG/ML IJ SOLN: 6.2500 mg | INTRAMUSCULAR | Status: DC | PRN

## 2020-08-16 MED ORDER — PROPOFOL 10 MG/ML IV BOLUS
INTRAVENOUS | Status: AC
  Filled 2020-08-16: qty 40

## 2020-08-16 MED ORDER — MIDAZOLAM HCL 2 MG/2ML IJ SOLN
2.0000 mg | Freq: Once | INTRAMUSCULAR | Status: AC
  Administered 2020-08-16: 2 mg via INTRAVENOUS

## 2020-08-16 MED ORDER — BUPIVACAINE HCL (PF) 0.25 % IJ SOLN
INTRAMUSCULAR | Status: DC | PRN
  Administered 2020-08-16: 9 mL

## 2020-08-16 MED ORDER — ONDANSETRON HCL 4 MG/2ML IJ SOLN
INTRAMUSCULAR | Status: DC | PRN
  Administered 2020-08-16: 8 mg via INTRAVENOUS

## 2020-08-16 MED ORDER — ROPIVACAINE HCL 5 MG/ML IJ SOLN
INTRAMUSCULAR | Status: DC | PRN
  Administered 2020-08-16: 30 mL via PERINEURAL

## 2020-08-16 MED ORDER — MIDAZOLAM HCL 2 MG/2ML IJ SOLN
INTRAMUSCULAR | Status: AC
  Filled 2020-08-16: qty 2

## 2020-08-16 MED ORDER — ACETAMINOPHEN 500 MG PO TABS
ORAL_TABLET | ORAL | Status: AC
  Filled 2020-08-16: qty 2

## 2020-08-16 MED ORDER — MORPHINE SULFATE (PF) 4 MG/ML IV SOLN
INTRAVENOUS | Status: AC
  Filled 2020-08-16: qty 1

## 2020-08-16 MED ORDER — DEXAMETHASONE SODIUM PHOSPHATE 10 MG/ML IJ SOLN
INTRAMUSCULAR | Status: DC | PRN
  Administered 2020-08-16: 10 mg

## 2020-08-16 MED ORDER — FENTANYL CITRATE (PF) 100 MCG/2ML IJ SOLN: 25.0000 ug | INTRAMUSCULAR | Status: DC | PRN

## 2020-08-16 MED ORDER — MIDAZOLAM HCL 5 MG/5ML IJ SOLN
INTRAMUSCULAR | Status: DC | PRN
  Administered 2020-08-16 (×2): 2 mg via INTRAVENOUS

## 2020-08-16 MED ORDER — MORPHINE SULFATE (PF) 4 MG/ML IV SOLN
INTRAVENOUS | Status: DC | PRN
  Administered 2020-08-16: 4 mg via SUBCUTANEOUS

## 2020-08-16 MED ORDER — FENTANYL CITRATE (PF) 100 MCG/2ML IJ SOLN
INTRAMUSCULAR | Status: DC | PRN
  Administered 2020-08-16 (×2): 25 ug via INTRAVENOUS
  Administered 2020-08-16: 50 ug via INTRAVENOUS

## 2020-08-16 MED ORDER — FENTANYL CITRATE (PF) 100 MCG/2ML IJ SOLN
100.0000 ug | Freq: Once | INTRAMUSCULAR | Status: AC
Start: 2020-08-16 — End: 2020-08-16
  Administered 2020-08-16: 100 ug via INTRAVENOUS

## 2020-08-16 MED ORDER — ACETAMINOPHEN 500 MG PO TABS
1000.0000 mg | ORAL_TABLET | Freq: Once | ORAL | Status: AC
  Administered 2020-08-16: 1000 mg via ORAL

## 2020-08-16 MED ORDER — HYDROCODONE-ACETAMINOPHEN 7.5-325 MG PO TABS: 1.0000 | ORAL_TABLET | Freq: Once | ORAL | Status: DC | PRN

## 2020-08-16 MED ORDER — SCOPOLAMINE 1 MG/3DAYS TD PT72
MEDICATED_PATCH | TRANSDERMAL | Status: AC
  Filled 2020-08-16: qty 1

## 2020-08-16 MED ORDER — DEXAMETHASONE SODIUM PHOSPHATE 4 MG/ML IJ SOLN
INTRAMUSCULAR | Status: DC | PRN
  Administered 2020-08-16: 5 mg via INTRAVENOUS

## 2020-08-16 MED ORDER — PROPOFOL 10 MG/ML IV BOLUS
INTRAVENOUS | Status: DC | PRN
  Administered 2020-08-16: 200 mg via INTRAVENOUS

## 2020-08-16 MED ORDER — LACTATED RINGERS IV SOLN: INTRAVENOUS | Status: DC

## 2020-08-16 MED ORDER — LIDOCAINE HCL (PF) 2 % IJ SOLN
INTRAMUSCULAR | Status: AC
  Filled 2020-08-16: qty 5

## 2020-08-16 MED ORDER — SODIUM CHLORIDE 0.9 % IR SOLN
Status: DC | PRN
  Administered 2020-08-16: 6000 mL

## 2020-08-16 MED ORDER — LIDOCAINE 2% (20 MG/ML) 5 ML SYRINGE
INTRAMUSCULAR | Status: DC | PRN
  Administered 2020-08-16: 100 mg via INTRAVENOUS

## 2020-08-16 MED FILL — HYDROCODON-APAP 5-325: 5-325 | 7 days supply | Qty: 30 | Fill #0

## 2020-08-16 SURGICAL SUPPLY — 39 items
BLADE CLIPPER SENSICLIP SURGIC (BLADE) IMPLANT
BNDG CMPR MED 15X6 ELC VLCR LF (GAUZE/BANDAGES/DRESSINGS) ×1
BNDG ELASTIC 6X15 VLCR STRL LF (GAUZE/BANDAGES/DRESSINGS) ×2 IMPLANT
BNDG ELASTIC 6X5.8 VLCR STR LF (GAUZE/BANDAGES/DRESSINGS) ×2 IMPLANT
COVER WAND RF STERILE (DRAPES) ×2 IMPLANT
DRAPE ARTHROSCOPY W/POUCH 114 (DRAPES) ×2 IMPLANT
DRAPE U-SHAPE 47X51 STRL (DRAPES) ×2 IMPLANT
DRSG PAD ABDOMINAL 8X10 ST (GAUZE/BANDAGES/DRESSINGS) ×2 IMPLANT
DURAPREP 26ML APPLICATOR (WOUND CARE) ×2 IMPLANT
EXCALIBUR 3.8MM X 13CM (MISCELLANEOUS) ×2 IMPLANT
GAUZE SPONGE 4X4 12PLY STRL (GAUZE/BANDAGES/DRESSINGS) ×2 IMPLANT
GAUZE SPONGE 4X4 12PLY STRL LF (GAUZE/BANDAGES/DRESSINGS) ×2 IMPLANT
GAUZE XEROFORM 1X8 LF (GAUZE/BANDAGES/DRESSINGS) ×2 IMPLANT
GLOVE ORTHO TXT STRL SZ7.5 (GLOVE) ×2 IMPLANT
GLOVE SRG 8 PF TXTR STRL LF DI (GLOVE) ×2 IMPLANT
GLOVE SURG ORTHO LTX SZ8 (GLOVE) ×2 IMPLANT
GLOVE SURG UNDER POLY LF SZ8 (GLOVE) ×4
GOWN STRL REUS W/ TWL LRG LVL3 (GOWN DISPOSABLE) ×1 IMPLANT
GOWN STRL REUS W/ TWL XL LVL3 (GOWN DISPOSABLE) ×2 IMPLANT
GOWN STRL REUS W/TWL LRG LVL3 (GOWN DISPOSABLE) ×2
GOWN STRL REUS W/TWL XL LVL3 (GOWN DISPOSABLE) ×4
IV NS IRRIG 3000ML ARTHROMATIC (IV SOLUTION) ×4 IMPLANT
KIT TURNOVER CYSTO (KITS) ×2 IMPLANT
MANIFOLD NEPTUNE II (INSTRUMENTS) ×2 IMPLANT
NS IRRIG 500ML POUR BTL (IV SOLUTION) ×2 IMPLANT
PACK ARTHROSCOPY DSU (CUSTOM PROCEDURE TRAY) ×2 IMPLANT
PACK BASIN DAY SURGERY FS (CUSTOM PROCEDURE TRAY) ×2 IMPLANT
PAD ARMBOARD 7.5X6 YLW CONV (MISCELLANEOUS) IMPLANT
PADDING CAST ABS 4INX4YD NS (CAST SUPPLIES) ×1
PADDING CAST ABS COTTON 4X4 ST (CAST SUPPLIES) ×1 IMPLANT
PADDING CAST COTTON 6X4 STRL (CAST SUPPLIES) ×2 IMPLANT
PENCIL SMOKE EVACUATOR (MISCELLANEOUS) IMPLANT
PORT APPOLLO RF 90DEGREE MULTI (SURGICAL WAND) IMPLANT
SUT ETHILON 3 0 PS 1 (SUTURE) ×2 IMPLANT
SYR CONTROL 10ML LL (SYRINGE) ×2 IMPLANT
TOWEL OR 17X26 10 PK STRL BLUE (TOWEL DISPOSABLE) ×2 IMPLANT
TUBING ARTHROSCOPY IRRIG 16FT (MISCELLANEOUS) ×2 IMPLANT
WATER STERILE IRR 1000ML POUR (IV SOLUTION) IMPLANT
WRAP KNEE MAXI GEL POST OP (GAUZE/BANDAGES/DRESSINGS) ×2 IMPLANT

## 2020-08-16 NOTE — Anesthesia Preprocedure Evaluation (Addendum)
Anesthesia Evaluation  Patient identified by MRN, date of birth, ID band Patient awake    Reviewed: Allergy & Precautions, NPO status , Patient's Chart, lab work & pertinent test results  History of Anesthesia Complications (+) PONV and history of anesthetic complications  Airway Mallampati: III  TM Distance: >3 FB Neck ROM: Full    Dental no notable dental hx. (+) Teeth Intact, Dental Advisory Given   Pulmonary neg pulmonary ROS,    Pulmonary exam normal breath sounds clear to auscultation       Cardiovascular hypertension, Pt. on medications Normal cardiovascular exam Rhythm:Regular Rate:Normal     Neuro/Psych  Headaches,    GI/Hepatic Neg liver ROS, GERD  Medicated and Controlled,  Endo/Other  diabetes, Well Controlled, Type 2, Oral Hypoglycemic AgentsMorbid obesityBMI 42 Last a1c 7.3  Renal/GU negative Renal ROS     Musculoskeletal  (+) Arthritis , Osteoarthritis,  Right knee medial meniscal tear    Abdominal (+) + obese,   Peds  Hematology negative hematology ROS (+)   Anesthesia Other Findings   Reproductive/Obstetrics negative OB ROS                            Anesthesia Physical Anesthesia Plan  ASA: III  Anesthesia Plan: General and Regional   Post-op Pain Management: GA combined w/ Regional for post-op pain   Induction: Intravenous  PONV Risk Score and Plan: 4 or greater and Ondansetron, Dexamethasone, Midazolam, Scopolamine patch - Pre-op, Propofol infusion and Treatment may vary due to age or medical condition  Airway Management Planned: LMA  Additional Equipment: None  Intra-op Plan:   Post-operative Plan: Extubation in OR  Informed Consent: I have reviewed the patients History and Physical, chart, labs and discussed the procedure including the risks, benefits and alternatives for the proposed anesthesia with the patient or authorized representative who has  indicated his/her understanding and acceptance.     Dental advisory given  Plan Discussed with: CRNA  Anesthesia Plan Comments: (Did well from PONV standpoint with last procedure (cysto)- inhalational, fentanyl, toradol)       Anesthesia Quick Evaluation

## 2020-08-16 NOTE — H&P (Signed)
Jamie Hall is an 55 y.o. female.   Chief Complaint: Left knee locking and catching with known meniscal tear HPI: The patient is a 55 year old female with a known meniscal tear of the right knee medial meniscus.  This was confirmed with MRI and clinical exam.  She has medial joint line tenderness and locking and catching with activities especially pivoting with the left knee.  A MRI was obtained of her left knee and it did confirm a horizontal tear of the posterior horn to the mid body of the meniscus on the medial side with only some slight thinning of the articular cartilage.  She works in the endoscopy suite and her knee issues are definitely impacting her work and her actives daily living to the point we have recommended an arthroscopic intervention for her right knee.  We had a long and thorough discussion about this as well and she understands her recommendations and does wish to proceed.  Past Medical History:  Diagnosis Date   DM type 2 (diabetes mellitus, type 2) ([place])    followed by pcp   GERD (gastroesophageal reflux disease)    Headache    History of kidney stones    Hypertension    OA (osteoarthritis)    PONV (postoperative nausea and vomiting)    Right knee meniscal tear     Past Surgical History:  Procedure Laterality Date   BREAST REDUCTION SURGERY  2006   CARPECTOMY Left 2007   proximal row, wrist   CYSTOSCOPY/URETEROSCOPY/HOLMIUM LASER/STENT PLACEMENT Left 06/07/2020   Procedure: CYSTOSCOPY/RETROGRADE/URETEROSCOPY/HOLMIUM LASER/STENT PLACEMENT;  Surgeon: Ceasar Mons, MD;  Location: [facility];  Service: Urology;  Laterality: Left;  ONLY NEEDS 31 MIN   GANGLION CYST EXCISION Right 1999   wrist   KIDNEY STONE SURGERY  06/07/2020   KNEE ARTHROSCOPY Right 2000   TONSILLECTOMY  age 89    Family History  Problem Relation Age of Onset   Early death Mother    Stroke Mother    Healthy Father    Healthy Sister    Healthy  Daughter    Cancer Maternal Grandfather    Drug abuse Maternal Grandfather    Hypertension Maternal Grandfather    Colon cancer Neg Hx    Esophageal cancer Neg Hx    Liver cancer Neg Hx    Pancreatic cancer Neg Hx    Rectal cancer Neg Hx    Stomach cancer Neg Hx    Social History:  reports that she has never smoked. She has never used smokeless tobacco. She reports previous alcohol use. She reports previous drug use.  Allergies:  Allergies  Allergen Reactions   Bee Venom     Other reaction(s): EDEMA   Benzoyl Peroxide Other (See Comments)    Makes eyes shut   Ceftin [Cefuroxime] Swelling    Throat swelling   Clindamycin/Lincomycin Hives   Codeine Hives   Hydrocodone     itching   Oxycodone-Acetaminophen Hives   Penicillins Hives   Tape Other (See Comments)    Blisters all adhesives likes paper tape    Medications Prior to Admission  Medication Sig Dispense Refill   atorvastatin (LIPITOR) 10 MG tablet TAKE 1 TABLET BY MOUTH AT BEDTIME. 90 tablet 1   levonorgestrel (MIRENA) 20 MCG/24HR IUD 1 each by Intrauterine route once. Due to be changed dec 2021     lisinopril (ZESTRIL) 10 MG tablet TAKE 1 TABLET BY MOUTH ONCE DAILY 90 tablet 1   metFORMIN (GLUCOPHAGE) 500 MG tablet  TAKE 1 TABLET (500 MG TOTAL) BY MOUTH 2 (TWO) TIMES DAILY WITH A MEAL. 180 tablet 1   omeprazole (PRILOSEC) 20 MG capsule TAKE 1 CAPSULE BY MOUTH ONCE DAILY (Patient taking differently: Take 20 mg by mouth every other day.) 90 capsule 1    Results for orders placed or performed during the hospital encounter of 08/16/20 (from the past 48 hour(s))  I-STAT, chem 8     Status: Abnormal   Collection Time: 08/16/20  8:03 AM  Result Value Ref Range   Sodium 136 135 - 145 mmol/L   Potassium 4.2 3.5 - 5.1 mmol/L   Chloride 107 98 - 111 mmol/L   BUN 12 6 - 20 mg/dL   Creatinine, Ser 0.60 0.44 - 1.00 mg/dL   Glucose, Bld 150 (H) 70 - 99 mg/dL    Comment: Glucose reference range applies  only to samples taken after fasting for at least 8 hours.   Calcium, Ion 0.88 (LL) 1.15 - 1.40 mmol/L   TCO2 25 22 - 32 mmol/L   Hemoglobin 13.9 12.0 - 15.0 g/dL   HCT 41.0 36.0 - 46.0 %   Comment NOTIFIED PHYSICIAN    No results found.  Review of Systems  Musculoskeletal: Positive for gait problem and joint swelling.  All other systems reviewed and are negative.   Blood pressure (!) 158/79, pulse 69, temperature 98.8 F (37.1 C), temperature source Oral, resp. rate 10, height 5\' 3"  (1.6 m), weight 106.2 kg, SpO2 98 %. Physical Exam Vitals reviewed.  Constitutional:      Appearance: Normal appearance.  HENT:     Head: Normocephalic and atraumatic.  Eyes:     Extraocular Movements: Extraocular movements intact.     Pupils: Pupils are equal, round, and reactive to light.  Cardiovascular:     Rate and Rhythm: Normal rate and regular rhythm.     Pulses: Normal pulses.  Pulmonary:     Effort: Pulmonary effort is normal.     Breath sounds: Normal breath sounds.  Abdominal:     Palpations: Abdomen is soft.  Musculoskeletal:     Cervical back: Normal range of motion and neck supple.     Right knee: Effusion and bony tenderness present. Decreased range of motion. Tenderness present over the medial joint line. Abnormal meniscus.  Neurological:     Mental Status: She is alert and oriented to person, place, and time.  Psychiatric:        Behavior: Behavior normal.      Assessment/Plan Right knee with complex medial meniscal tear  The plan is to proceed to surgery today as an outpatient for right knee arthroscopy with a partial medial meniscectomy.  The risk and benefits of surgery been discussed in detail and informed consent is obtained.  The right knee has been marked.  Mcarthur Rossetti, MD 08/16/2020, 8:52 AM

## 2020-08-16 NOTE — Brief Op Note (Signed)
08/16/2020  9:57 AM  PATIENT:  Jamie Hall  55 y.o. 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   Delay start of Pharmacological VTE agent (>24hrs) due to surgical blood loss or risk of bleeding: no

## 2020-08-16 NOTE — Op Note (Signed)
Jamie Hall, SERVANTES MEDICAL RECORD [id number] ACCOUNT [id number] DATE OF BIRTH:18-Jan-1966 FACILITY: [facility] LOCATION: [facility] PHYSICIAN:Izaiah Tabb Kerry Fort, MD  OPERATIVE REPORT  DATE OF PROCEDURE:  08/16/2020  PREOPERATIVE DIAGNOSIS:  Complex horizontal tear of the medial meniscus, right knee.  POSTOPERATIVE DIAGNOSES:  1.  Horizontal complex tear of the medial meniscus, right knee. 2.  Grade IV chondromalacia, right knee, medial femoral condyle.  PROCEDURES: 1.  Right knee arthroscopy with partial medial meniscectomy. 2.  Chondroplasty wide area of right medial femoral condyle.  SURGEON:  Lind Guest. Ninfa Linden, MD  ASSISTANT:  Erskine Emery, PA-C  ANESTHESIA: 1.  Local right knee block. 2.  General.  ESTIMATED BLOOD LOSS:  Minimal.  COMPLICATIONS:  None.  INDICATIONS:  Jamie Hall is a 55 year old female well known to me.  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  She originally injured her knee about 20 years ago when she said she blew the ligaments of the knee after an accident.  She had done well  for a long period of time and had a remote surgical intervention to that knee arthroscopically.  More recently, over the last couple of months, she has developed a lot of right knee pain with pivoting activities and locking and catching.  She has had  swelling on the medial aspect of her knee and has tried and failed all forms of conservative treatment.  An MRI was obtained, and it showed a complex horizontal tear from the meniscal root to the midbody of the medial meniscus.  The radiologist felt  there was just fraying of the articular cartilage.  At this point, with very conservative treatment, we did recommend arthroscopic intervention with a partial medial meniscectomy.  We had a long and thorough discussion about this type of surgery  including a discussion of the risks and benefits of surgery.  DESCRIPTION OF PROCEDURE:  After informed consent was  obtained, appropriate right knee was marked.  A local knee block was obtained in the holding room.  Then, she was brought to the operating room and placed supine on the operating table.  General  anesthesia was then obtained.  The bed was raised and a lateral leg post utilized.  We prepped the thigh, knee, leg, ankle with DuraPrep and sterile drapes including a sterile stockinette with the bed raised and the right operative knee flexed off the  side table.  A timeout was called.  She was identified as correct patient, correct right knee.  I then made an anterolateral arthroscopy portal and inserted a cannula in knee and did drain a mild effusion from the knee.  I placed the camera into the  knee.  We went to the medial compartment and made an anterior medial arthroscopy portal.  We found unfortunately a wide area of worn cartilage on the medial femoral condyle.  The articular cartilage was fraying in this area, and we had to use an  arthroscopic shaver to perform a chondroplasty over a wide area.  There was a small area of exposed bone as well, but it was definitely more significant cartilage loss than we were expecting and what the MRI showed.  We did find a complex tear of the  medial meniscus at the posterior horn to midbody.  Using arthroscopic shaver and basket forceps biters, we were able to perform a partial medial meniscectomy removing the flap component and taking this back to a stable margin.  Again, of more concern is  the cartilage loss in the medial aspect  of her knee.  We assessed the PCL and ACL and found those to be intact in their intercondylar area.  The lateral compartment of her knee was intact.  We assessed in a figure-of-four position finding an intact  lateral meniscus and intact cartilage on the lateral femoral condyle and lateral tibial plateau.  Finally, we assessed the patellofemoral joint.  I did find some grade III chondromalacia of the trochlea groove and at least grade II  chondromalacia of the  patella.  We then debrided this as well.  We then allowed fluid lavage of the knee and drained all fluid from the knee.  We closed the portal sites with interrupted nylon suture and inserted some Marcaine and morphine into the knee itself.  Xeroform,  well-padded sterile dressing was applied.  She was awakened, extubated, and taken to recovery room in stable condition with all final counts being correct and no complications noted.  Postoperatively, we will have her weightbear as tolerated with slow  increase in her activities.  We will see her back in the office in a week.  IN/NUANCE  D:08/16/2020 T:08/16/2020 JOB:[id number]

## 2020-08-16 NOTE — Addendum Note (Signed)
Addendum  created 08/16/20 1123 by Justice Rocher, CRNA   Charge Capture section accepted

## 2020-08-16 NOTE — Discharge Instructions (Signed)
You may put all of your weight on your right knee as comfort allows. Increase her activities as comfort allows. Do expect significant right knee swelling -ice and elevation as needed. Do take a 325 mg EC aspirin daily for 1 week. Do pump your feet occasionally throughout the day for circulation.  You may remove your right knee dressing in 24 hours and get your incisions wet daily in the shower. Do expect some bloody drainage. You may place Band-Aids over the small incisions daily after each shower.  Knee Arthroscopy, Care After This sheet gives you information about how to care for yourself after your procedure. Your health care provider may also give you more specific instructions. If you have problems or questions, contact your health care provider. What can I expect after the procedure? After the procedure, it is common to have:  Soreness.  Swelling.  Pain.  A small amount of fluid from the incisions. Follow these instructions at home: Medicines  Take over-the-counter and prescription medicines only as told by your health care provider.  Ask your health care provider if the medicine prescribed to you: ? Requires you to avoid driving or using machinery. ? Can cause constipation. You may need to take these actions to prevent or treat constipation:  Drink enough fluid to keep your urine pale yellow.  Take over-the-counter or prescription medicines.  Eat foods that are high in fiber, such as beans, whole grains, and fresh fruits and vegetables.  Limit foods that are high in fat and processed sugars, such as fried or sweet foods. If you have a brace or immobilizer:  Wear it as told by your health care provider. Remove it only as told by your health care provider.  Loosen it if your toes tingle, become numb, or turn cold and blue.  Keep it clean and dry. Bathing  Do not take baths, swim, or use a hot tub until your health care provider approves. Ask your health care  provider if you may take showers.  Keep your bandage (dressing) dry until your health care provider says that it can be removed.  If the brace or immobilizer is not waterproof: ? Do not let it get wet. ? Cover it with a watertight covering when you take a bath or shower. Incision care  Follow instructions from your health care provider about how to take care of your incisions. Make sure you: ? Wash your hands with soap and water for at least 20 seconds before and after you change your dressing. If soap and water are not available, use hand sanitizer. ? Change your dressing as told by your health care provider. ? Leave stitches (sutures) or adhesive strips in place. These skin closures may need to stay in place for 2 weeks or longer. If adhesive strip edges start to loosen and curl up, you may trim the loose edges. Do not remove adhesive strips completely unless your health care provider tells you to do that.  Check your incision areas every day for signs of infection. Check for: ? Redness. ? More swelling or pain. ? Blood or more fluid. ? Warmth. ? Pus or a bad smell.   Managing pain, stiffness, and swelling  If directed, put ice on the injured area. To do this: ? If you have a removable brace or immobilizer, remove it as told by your health care provider. ? Put ice in a plastic bag or use the icing device (cold therapy unit) that you were given. Follow instructions about  how to use the icing device. ? Place a towel between your skin and the bag or between your skin and the icing device. ? Leave the ice on for 20 minutes, 2-3 times a day. ? Remove the ice if your skin turns bright red. This is very important. If you cannot feel pain, heat, or cold, you have a greater risk of damage to the area.  Move your toes often to reduce stiffness and swelling.  Raise (elevate) the injured area above the level of your heart while you are sitting or lying down.   Activity  Do not use your knee  to support your body weight until your health care provider says that you can. Follow weight-bearing restrictions as told. Use crutches or other devices to help you move around (assistive devices) as told by your health care provider.  Ask your health care provider what activities are safe for you during recovery, and what activities you need to avoid.  If physical therapy was prescribed, do exercises as told by your health care provider. Doing exercises may help improve knee movement, range of motion, and flexibility.  Do not lift anything that is heavier than 10 lb (4.5 kg), or the limit that you are told, until your health care provider says that it is safe. General instructions  Do not drive until your health care provider approves. You may be able to drive after 1-3 weeks.  Do not use any products that contain nicotine or tobacco, such as cigarettes, e-cigarettes, and chewing tobacco. These can delay incision or bone healing after surgery. If you need help quitting, ask your health care provider.  Wear compression stockings as told by your health care provider. These stockings help to prevent blood clots and reduce swelling in your legs.  Keep all follow-up visits. This is important. Contact a health care provider if:  You have any of these signs of infection: ? Redness or more pain around an incision. ? Blood or more fluid coming from an incision. ? Warmth coming from an incision. ? Pus or a bad smell coming from an incision. ? More swelling in your knee. ? A fever or chills.  You have severe knee pain, and medicine does not help.  An incision opens up. Get help right away if:  You have trouble breathing or shortness of breath.  You have chest pain.  You develop pain or swelling in your lower leg or at the back of your knee.  You have numbness or tingling in your lower leg or your foot.  You notice that your foot or toes look darker than normal or are cooler than  normal. These symptoms may represent a serious problem that is an emergency. Do not wait to see if the symptoms will go away. Get medical help right away. Call your local emergency services (911 in the [place] Do not drive yourself to the hospital. Summary  To help relieve pain and swelling, put ice on the injured area for 20 minutes, 2-3 times a day.  Raise (elevate) the injured area above the level of your heart while you are sitting or lying down.  If physical therapy was prescribed, do exercises as told by your health care provider. Exercises may help improve range of motion. This information is not intended to replace advice given to you by your health care provider. Make sure you discuss any questions you have with your health care provider. Document Revised: 11/13/2019 Document Reviewed: 11/13/2019 Elsevier Patient Education  2021  [facility] Care Instructions  Activity: Get plenty of rest for the remainder of the day. A responsible individual must stay with you for 24 hours following the procedure.  For the next 24 hours, DO NOT: -Drive a car -Paediatric nurse -Drink alcoholic beverages -Take any medication unless instructed by your physician -Make any legal decisions or sign important papers.  Meals: Start with liquid foods such as gelatin or soup. Progress to regular foods as tolerated. Avoid greasy, spicy, heavy foods. If nausea and/or vomiting occur, drink only clear liquids until the nausea and/or vomiting subsides. Call your physician if vomiting continues.  Special Instructions/Symptoms: Your throat may feel dry or sore from the anesthesia or the breathing tube placed in your throat during surgery. If this causes discomfort, gargle with warm salt water. The discomfort should disappear within 24 hours.  If you had a scopolamine patch placed behind your ear for the management of post- operative nausea and/or vomiting:  1. The medication in the  patch is effective for 72 hours, after which it should be removed.  Wrap patch in a tissue and discard in the trash. Wash hands thoroughly with soap and water. 2. You may remove the patch earlier than 72 hours if you experience unpleasant side effects which may include dry mouth, dizziness or visual disturbances. 3. Avoid touching the patch. Wash your hands with soap and water after contact with the patch. Take patch off by Monday, 08/19/2020

## 2020-08-16 NOTE — Anesthesia Postprocedure Evaluation (Signed)
Anesthesia Post Note  Patient: Jamie Hall  Procedure(s) Performed: RIGHT KNEE ARTHROSCOPY WITH PARTIAL MEDIAL MENISCECTOMY (Right Knee)     Patient location during evaluation: PACU Anesthesia Type: Regional and General Level of consciousness: awake and alert, oriented and patient cooperative Pain management: pain level controlled Vital Signs Assessment: post-procedure vital signs reviewed and stable Respiratory status: spontaneous breathing, nonlabored ventilation and respiratory function stable Cardiovascular status: blood pressure returned to baseline and stable Postop Assessment: no apparent nausea or vomiting Anesthetic complications: no   No complications documented.  Last Vitals:  Vitals:   08/16/20 1028 08/16/20 1030  BP:  126/65  Pulse: 64   Resp: (!) 9   Temp:    SpO2: 100%     Last Pain:  Vitals:   08/16/20 0731  TempSrc: Oral  PainSc: [place]

## 2020-08-16 NOTE — Progress Notes (Signed)
Assisted Dr. Doroteo Glassman with right, ultrasound guided, adductor canal block. Side rails up, monitors on throughout procedure. See vital signs in flow sheet. Tolerated Procedure well.  Dorrene German, RN

## 2020-08-16 NOTE — Transfer of Care (Signed)
Immediate Anesthesia Transfer of Care Note  Patient: Jamie Hall  Procedure(s) Performed: Procedure(s) (LRB): RIGHT KNEE ARTHROSCOPY WITH PARTIAL MEDIAL MENISCECTOMY (Right)  Patient Location: PACU  Anesthesia Type: General  Level of Consciousness: awake, sedated, patient cooperative and responds to stimulation  Airway & Oxygen Therapy: Patient Spontanous Breathing and Patient connected to [place] 02 and soft FM   Post-op Assessment: Report given to PACU RN, Post -op Vital signs reviewed and stable and Patient moving all extremities  Post vital signs: Reviewed and stable  Complications: No apparent anesthesia complications

## 2020-08-16 NOTE — Anesthesia Procedure Notes (Signed)
Anesthesia Regional Block: Adductor canal block   Pre-Anesthetic Checklist: ,, timeout performed, Correct Patient, Correct Site, Correct Laterality, Correct Procedure, Correct Position, site marked, Risks and benefits discussed,  Surgical consent,  Pre-op evaluation,  At surgeon's request and post-op pain management  Laterality: Right  Prep: Maximum Sterile Barrier Precautions used, chloraprep       Needles:  Injection technique: Single-shot  Needle Type: Echogenic Stimulator Needle     Needle Length: 9cm  Needle Gauge: 22     Additional Needles:   Procedures:,,,, ultrasound used (permanent image in chart),,,,  Narrative:  Start time: 08/16/2020 8:00 AM End time: 08/16/2020 8:10 AM Injection made incrementally with aspirations every 5 mL.  Performed by: Personally  Anesthesiologist: Pervis Hocking, DO  Additional Notes: Monitors applied. No increased pain on injection. No increased resistance to injection. Injection made in 5cc increments. Good needle visualization. Patient tolerated procedure well.

## 2020-08-16 NOTE — Anesthesia Procedure Notes (Signed)
Procedure Name: LMA Insertion Date/Time: 08/16/2020 9:19 AM Performed by: Justice Rocher, CRNA Pre-anesthesia Checklist: Patient identified, Emergency Drugs available, Suction available, Patient being monitored and Timeout performed Patient Re-evaluated:Patient Re-evaluated prior to induction Oxygen Delivery Method: Circle system utilized Preoxygenation: Pre-oxygenation with 100% oxygen Induction Type: IV induction Ventilation: Mask ventilation without difficulty LMA: LMA inserted LMA Size: 4.0 Number of attempts: 1 Airway Equipment and Method: Bite block Placement Confirmation: positive ETCO2,  breath sounds checked- equal and bilateral and CO2 detector Tube secured with: Tape Dental Injury: Teeth and Oropharynx as per pre-operative assessment

## 2020-08-17 ENCOUNTER — Other Ambulatory Visit: Payer: Self-pay | Admitting: Family Medicine

## 2020-08-19 ENCOUNTER — Encounter (HOSPITAL_BASED_OUTPATIENT_CLINIC_OR_DEPARTMENT_OTHER): Payer: Self-pay | Admitting: Orthopaedic Surgery

## 2020-08-19 ENCOUNTER — Other Ambulatory Visit: Payer: Self-pay | Admitting: Family Medicine

## 2020-08-19 MED FILL — METFORMIN HCL 500 MG TABS: 500 | 90 days supply | Qty: 180 | Fill #0

## 2020-08-20 DIAGNOSIS — Z1151 Encounter for screening for human papillomavirus (HPV): Secondary | ICD-10-CM | POA: Diagnosis not present

## 2020-08-20 DIAGNOSIS — Z6841 Body Mass Index (BMI) 40.0 and over, adult: Secondary | ICD-10-CM | POA: Diagnosis not present

## 2020-08-20 DIAGNOSIS — Z30431 Encounter for routine checking of intrauterine contraceptive device: Secondary | ICD-10-CM | POA: Diagnosis not present

## 2020-08-20 DIAGNOSIS — Z1389 Encounter for screening for other disorder: Secondary | ICD-10-CM | POA: Diagnosis not present

## 2020-08-20 DIAGNOSIS — Z780 Asymptomatic menopausal state: Secondary | ICD-10-CM | POA: Diagnosis not present

## 2020-08-20 DIAGNOSIS — Z1231 Encounter for screening mammogram for malignant neoplasm of breast: Secondary | ICD-10-CM | POA: Diagnosis not present

## 2020-08-20 DIAGNOSIS — Z01419 Encounter for gynecological examination (general) (routine) without abnormal findings: Secondary | ICD-10-CM | POA: Diagnosis not present

## 2020-08-20 DIAGNOSIS — Z124 Encounter for screening for malignant neoplasm of cervix: Secondary | ICD-10-CM | POA: Diagnosis not present

## 2020-08-20 LAB — HM MAMMOGRAPHY

## 2020-08-21 DIAGNOSIS — Z1151 Encounter for screening for human papillomavirus (HPV): Secondary | ICD-10-CM | POA: Diagnosis not present

## 2020-08-21 DIAGNOSIS — Z124 Encounter for screening for malignant neoplasm of cervix: Secondary | ICD-10-CM | POA: Diagnosis not present

## 2020-08-21 LAB — HM PAP SMEAR

## 2020-08-22 ENCOUNTER — Ambulatory Visit (INDEPENDENT_AMBULATORY_CARE_PROVIDER_SITE_OTHER): Payer: 59 | Admitting: Orthopaedic Surgery

## 2020-08-22 ENCOUNTER — Encounter: Payer: Self-pay | Admitting: Orthopaedic Surgery

## 2020-08-22 DIAGNOSIS — S83241D Other tear of medial meniscus, current injury, right knee, subsequent encounter: Secondary | ICD-10-CM

## 2020-08-22 DIAGNOSIS — Z9889 Other specified postprocedural states: Secondary | ICD-10-CM

## 2020-08-22 NOTE — Progress Notes (Signed)
Jamie Hall is a week out from a right knee arthroscopy.  We found an area of significant cartilage loss in the medial femoral condyle and a large medial meniscal tear.  She is doing well postoperatively.  She does not take any pain medication but is limping.  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  I did go over the arthroscopy pictures.  She understands that there is a wide area of cartilage loss on the medial femoral condyle.  There was not exposed bone.  We did leave a decent amount of meniscus remaining.  Her lateral compartment looks pristine and her intercondylar area was intact as well.  She has been worked on Licensed conveyancer exercises and avoid high impact aerobic activities.  She can certainly try either glucosamine or turmeric as a supplement for knee arthritis.  She is a good candidate at some point for considering hyaluronic acid for her knee.  All questions and concerns were answered addressed.  We will see her back in 4 weeks to see how she is doing overall.  Of note her right knee did show an effusion but it is not significant enough to aspirate today.  She has good range of motion of the knee and her sutures were removed.

## 2020-08-25 LAB — HM PAP SMEAR: CHL HPV: NEGATIVE

## 2020-09-12 ENCOUNTER — Ambulatory Visit (INDEPENDENT_AMBULATORY_CARE_PROVIDER_SITE_OTHER): Payer: 59 | Admitting: Physician Assistant

## 2020-09-12 ENCOUNTER — Telehealth: Payer: Self-pay

## 2020-09-12 ENCOUNTER — Encounter: Payer: Self-pay | Admitting: Physician Assistant

## 2020-09-12 DIAGNOSIS — M25561 Pain in right knee: Secondary | ICD-10-CM | POA: Diagnosis not present

## 2020-09-12 DIAGNOSIS — Z9889 Other specified postprocedural states: Secondary | ICD-10-CM

## 2020-09-12 DIAGNOSIS — H5213 Myopia, bilateral: Secondary | ICD-10-CM | POA: Diagnosis not present

## 2020-09-12 MED ORDER — METHYLPREDNISOLONE ACETATE 40 MG/ML IJ SUSP
40.0000 mg | INTRAMUSCULAR | Status: AC | PRN
  Administered 2020-09-12: 40 mg via INTRAMUSCULAR

## 2020-09-12 MED ORDER — LIDOCAINE HCL 1 % IJ SOLN
1.0000 mL | INTRAMUSCULAR | Status: AC | PRN
  Administered 2020-09-12: 1 mL

## 2020-09-12 NOTE — Progress Notes (Signed)
HPI: Jamie Hall is a 55 year old female status post right knee arthroscopy 08/16/2020.  She was doing well till she was sitting in her office and was stretching her knee and felt something pop in the knee since that time she has had pain in the anterior medial aspect of the knee near the port site.  States the pain is severe with prolonged walking.  She is having no calf pain.  Otherwise no complaints. Knee arthroscopy did find areas of significant cartilage loss involving the medial femoral condyle and a large medial meniscal tear.  She is gone back to work within the first week of having the knee arthroscopy.  Physical exam: Right knee good range of motion.  No abnormal warmth erythema.  Port sites healing well no signs of infection.  Calf supple nontender.  She has tenderness just proximal to the medial port site with palpation.  There is no ecchymosis or erythema in this region.  No significant effusion of the knee.    Procedure Note  Patient: Jamie Hall             Date of Birth: [birth year]           MRN: [id number]             Visit Date: 09/12/2020  Procedures: Visit Diagnoses:  1. Status post arthroscopy of right knee   2. Acute pain of right knee     Trigger Point Inj  Date/Time: 09/12/2020 5:19 PM Performed by: Pete Pelt, PA-C Authorized by: Pete Pelt, PA-C   Consent Given by:  Patient Site marked: the procedure site was marked   Timeout: prior to procedure the correct patient, procedure, and site was verified   Indications:  Pain Total # of Trigger Points:  1 Location: lower extremity   Needle Size:  27 G Approach:  Medial Medications #1:  1 mL lidocaine 1 %; 40 mg methylPREDNISolone acetate 40 MG/ML   Plan: She will work on range of motion of the knee.  Follow-up with Dr. Ninfa Linden in 1 week to see how she responded to the injection.  Questions encouraged and answered at length.

## 2020-09-12 NOTE — Telephone Encounter (Signed)
Patient called she is a post op patient she stated her knee is hurting bad and she cant walk she would like to be worked into [employer] or [facility] schedule if possible call [phone]

## 2020-09-12 NOTE — Telephone Encounter (Signed)
Can we work her in today?

## 2020-09-12 NOTE — Telephone Encounter (Signed)
I worked her in today at [employer]

## 2020-09-12 NOTE — Telephone Encounter (Signed)
Jamie Hall I guess

## 2020-09-17 ENCOUNTER — Telehealth: Payer: Self-pay | Admitting: Orthopaedic Surgery

## 2020-09-17 ENCOUNTER — Other Ambulatory Visit: Payer: Self-pay | Admitting: Family Medicine

## 2020-09-17 ENCOUNTER — Telehealth: Payer: Self-pay

## 2020-09-17 ENCOUNTER — Other Ambulatory Visit: Payer: Self-pay

## 2020-09-17 ENCOUNTER — Ambulatory Visit (HOSPITAL_COMMUNITY)
Admission: RE | Admit: 2020-09-17 | Discharge: 2020-09-17 | Disposition: A | Discharge status: Home / Self Care | Payer: 59 | Source: Ambulatory Visit | Attending: Orthopaedic Surgery | Admitting: Orthopaedic Surgery

## 2020-09-17 DIAGNOSIS — M1711 Unilateral primary osteoarthritis, right knee: Secondary | ICD-10-CM | POA: Diagnosis not present

## 2020-09-17 DIAGNOSIS — E1169 Type 2 diabetes mellitus with other specified complication: Secondary | ICD-10-CM

## 2020-09-17 DIAGNOSIS — M25561 Pain in right knee: Secondary | ICD-10-CM | POA: Insufficient documentation

## 2020-09-17 DIAGNOSIS — M25461 Effusion, right knee: Secondary | ICD-10-CM | POA: Diagnosis not present

## 2020-09-17 DIAGNOSIS — R600 Localized edema: Secondary | ICD-10-CM | POA: Diagnosis not present

## 2020-09-17 DIAGNOSIS — E785 Hyperlipidemia, unspecified: Secondary | ICD-10-CM

## 2020-09-17 MED ORDER — ATORVASTATIN CALCIUM 10 MG PO TABS: 10.0000 mg | ORAL_TABLET | Freq: Every day | ORAL | 0 refills | Status: DC

## 2020-09-17 MED FILL — ATORVASTATIN CALCIUM 10 MG: 10 | 90 days supply | Qty: 90 | Fill #0

## 2020-09-17 MED FILL — OMEPRAZOLE 20 MG CAP: 20 | 90 days supply | Qty: 90 | Fill #0

## 2020-09-17 NOTE — Telephone Encounter (Signed)
Pt called stating her MRI report is back

## 2020-09-17 NOTE — Telephone Encounter (Signed)
Pt has appt on Thursday

## 2020-09-17 NOTE — Telephone Encounter (Signed)
Pt called stating she was speaking with Caryl Pina earlier and was supposed to hear back but still hasn't. Pt would like a CB with an update  [phone]

## 2020-09-17 NOTE — Telephone Encounter (Signed)
Patient aware we put an order in for an MRI

## 2020-09-19 ENCOUNTER — Ambulatory Visit: Payer: 59 | Admitting: Physician Assistant

## 2020-09-19 ENCOUNTER — Encounter: Payer: Self-pay | Admitting: Orthopaedic Surgery

## 2020-09-19 ENCOUNTER — Ambulatory Visit: Payer: 59 | Admitting: Orthopaedic Surgery

## 2020-09-19 ENCOUNTER — Ambulatory Visit (INDEPENDENT_AMBULATORY_CARE_PROVIDER_SITE_OTHER): Payer: 59 | Admitting: Orthopaedic Surgery

## 2020-09-19 DIAGNOSIS — Z9889 Other specified postprocedural states: Secondary | ICD-10-CM

## 2020-09-19 NOTE — Progress Notes (Signed)
Jamie Hall comes in today with quite severe right knee pain.  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  She actually did very well postoperatively right after surgery but then last week developed some significant pain over the medial femoral condyle.  An injection of steroid was applied in this area.  She has been trying to offload her knee on crutches and some of the pain is eased off but it is still quite severe.  We actually obtained a MRI of the right knee this week we have that in comparison to her MRI from before surgery.  I did review the MRI with her today and we went over the films and compared these to the previous MRI.  There is a wide area of extensive subchondral edema all throughout the medial femoral condyle that was not present prior to surgery.  This is indicative of worsening arthritis with that knee.  She would like to try medial compartment offloading osteoarthritis brace which I think is reasonable to send her to [facility] for.  I would also like to send her to Dr. Carter Kitten with [facility] to evaluate her for the possibility of a partial knee arthroplasty.  She agrees with this referral as well.

## 2020-09-20 NOTE — Addendum Note (Signed)
Addended by: Robyne Peers on: 09/20/2020 08:36 AM   Modules accepted: Orders

## 2020-09-23 ENCOUNTER — Other Ambulatory Visit (HOSPITAL_COMMUNITY): Payer: Self-pay | Admitting: Orthopedic Surgery

## 2020-09-23 DIAGNOSIS — M25561 Pain in right knee: Secondary | ICD-10-CM | POA: Diagnosis not present

## 2020-09-23 MED FILL — MELOXICAM 15 MG TABLET: 15 | 30 days supply | Qty: 30 | Fill #0

## 2020-09-25 ENCOUNTER — Telehealth: Payer: Self-pay | Admitting: Orthopaedic Surgery

## 2020-09-25 NOTE — Telephone Encounter (Signed)
[employer] forms received. Sent to [employer]. 

## 2020-09-26 ENCOUNTER — Encounter: Payer: Self-pay | Admitting: Orthopaedic Surgery

## 2020-09-26 ENCOUNTER — Ambulatory Visit (INDEPENDENT_AMBULATORY_CARE_PROVIDER_SITE_OTHER): Payer: 59 | Admitting: Orthopaedic Surgery

## 2020-09-26 DIAGNOSIS — M1711 Unilateral primary osteoarthritis, right knee: Secondary | ICD-10-CM | POA: Diagnosis not present

## 2020-09-26 DIAGNOSIS — M1712 Unilateral primary osteoarthritis, left knee: Secondary | ICD-10-CM

## 2020-09-26 NOTE — Progress Notes (Signed)
The patient is well-known to me.  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  She did have a significant meniscal tear.  She also has an ACL deficient knee and wearing of the cartilage in that knee.  I felt that she still may be a candidate for a partial knee arthroplasty and asked under Dr. Mardelle Matte to consider this but he did point out the fact that with an ACL deficient knee that a total knee arthroplasty would be more appropriate.  Her pain is easing off some without left knee.  She does have now medial osteoarthritis offloading brace that she is currently wearing.  Dr. Mardelle Matte did send in some meloxicam for her to try as well.  She has been using crutches to also offload her right knee.  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  We went over in detail what knee replacement surgery involves.  Unfortunately given the extent of arthritis in the medial compartment of her knee combined with the osteonecrosis, we are recommending a total knee arthroplasty.  I did show her knee replacement model and described what the surgery involves in detail.  We talked about the risk and benefits of surgery and what to expect from an intraoperative and postoperative course.  We will work on scheduling the surgery for sometime next month.  Obviously if she develops worsening pain or collapse she will let me know because we would need to put her case down is more urgent.  She will continue the medial compartment offloading brace as well as offloading the knee with a cane or crutch in the opposite side.  She can continue meloxicam.  All question concerns were answered and addressed.  We will work on getting her scheduled for surgery next month.

## 2020-09-30 ENCOUNTER — Ambulatory Visit: Payer: 59 | Admitting: Orthopaedic Surgery

## 2020-09-30 ENCOUNTER — Telehealth: Payer: Self-pay | Admitting: Orthopaedic Surgery

## 2020-09-30 NOTE — Telephone Encounter (Signed)
[employer] forms received. Sent to [employer]. 

## 2020-10-11 ENCOUNTER — Other Ambulatory Visit: Payer: Self-pay

## 2020-10-11 ENCOUNTER — Encounter: Payer: Self-pay | Admitting: Family Medicine

## 2020-10-11 ENCOUNTER — Ambulatory Visit: Payer: 59 | Admitting: Family Medicine

## 2020-10-11 VITALS — BP 130/85 | HR 65 | Temp 97.5°F | Resp 18 | Ht 63.0 in | Wt 234.1 lb

## 2020-10-11 DIAGNOSIS — I10 Essential (primary) hypertension: Secondary | ICD-10-CM | POA: Diagnosis not present

## 2020-10-11 DIAGNOSIS — E785 Hyperlipidemia, unspecified: Secondary | ICD-10-CM

## 2020-10-11 DIAGNOSIS — E1169 Type 2 diabetes mellitus with other specified complication: Secondary | ICD-10-CM

## 2020-10-11 DIAGNOSIS — E119 Type 2 diabetes mellitus without complications: Secondary | ICD-10-CM | POA: Diagnosis not present

## 2020-10-11 LAB — BASIC METABOLIC PANEL
BUN: 12 mg/dL (ref 6–23)
CO2: 31 mEq/L (ref 19–32)
Calcium: 9.3 mg/dL (ref 8.4–10.5)
Chloride: 101 mEq/L (ref 96–112)
Creatinine, Ser: 0.75 mg/dL (ref 0.40–1.20)
GFR: 91.32 mL/min (ref >60.00)
Glucose, Bld: 126 mg/dL — ABNORMAL HIGH (ref 70–99)
Potassium: 3.7 mEq/L (ref 3.5–5.1)
Sodium: 140 mEq/L (ref 135–145)

## 2020-10-11 LAB — CBC WITH DIFFERENTIAL/PLATELET
Basophils Absolute: 0.1 10*3/uL (ref 0.0–0.1)
Basophils Relative: 1 % (ref 0.0–3.0)
Eosinophils Absolute: 0.5 10*3/uL (ref 0.0–0.7)
Eosinophils Relative: 4.6 % (ref 0.0–5.0)
HCT: 38.4 % (ref 36.0–46.0)
Hemoglobin: 12.5 g/dL (ref 12.0–15.0)
Lymphocytes Relative: 19.2 % (ref 12.0–46.0)
Lymphs Abs: 2.2 10*3/uL (ref 0.7–4.0)
MCHC: 32.6 g/dL (ref 30.0–36.0)
MCV: 86.2 fl (ref 78.0–100.0)
Monocytes Absolute: 0.7 10*3/uL (ref 0.1–1.0)
Monocytes Relative: 5.7 % (ref 3.0–12.0)
Neutro Abs: 7.9 10*3/uL — ABNORMAL HIGH (ref 1.4–7.7)
Neutrophils Relative %: 69.5 % (ref 43.0–77.0)
Platelets: 377 10*3/uL (ref 150.0–400.0)
RBC: 4.45 Mil/uL (ref 3.87–5.11)
RDW: 14.8 % (ref 11.5–15.5)
WBC: 11.4 10*3/uL — ABNORMAL HIGH (ref 4.0–10.5)

## 2020-10-11 LAB — LIPID PANEL
Cholesterol: 112 mg/dL (ref 0–200)
HDL: 42.5 mg/dL (ref >39.00)
LDL Cholesterol: 60 mg/dL (ref 0–99)
NonHDL: 69.41
Total CHOL/HDL Ratio: 3
Triglycerides: 47 mg/dL (ref 0.0–149.0)
VLDL: 9.4 mg/dL (ref 0.0–40.0)

## 2020-10-11 LAB — TSH: TSH: 1.55 u[IU]/mL (ref 0.35–4.50)

## 2020-10-11 LAB — HEPATIC FUNCTION PANEL
ALT: 23 U/L (ref 0–35)
AST: 16 U/L (ref 0–37)
Albumin: 3.9 g/dL (ref 3.5–5.2)
Alkaline Phosphatase: 70 U/L (ref 39–117)
Bilirubin, Direct: 0 mg/dL (ref 0.0–0.3)
Total Bilirubin: 0.3 mg/dL (ref 0.2–1.2)
Total Protein: 6.8 g/dL (ref 6.0–8.3)

## 2020-10-11 LAB — HEMOGLOBIN A1C: Hgb A1c MFr Bld: 7.5 % — ABNORMAL HIGH (ref 4.6–6.5)

## 2020-10-11 NOTE — Assessment & Plan Note (Signed)
Chronic problem.  On Lipitor 10mg daily w/o difficulty.  Check labs.  Adjust meds prn  

## 2020-10-11 NOTE — Patient Instructions (Signed)
Follow up in 3-4 months to recheck sugar We'll notify you of your lab results and make any changes if needed Continue to work on low carb diet since your physical activity is so limited Call with any questions or concerns Hang in there!!!

## 2020-10-11 NOTE — Progress Notes (Signed)
   Subjective:    Patient ID: Jamie Hall, female    DOB: [birth year], 55 y.o.   MRN: [id number]  HPI DM- chronic problem.  On Metformin 500mg  BID.  On ACE for renal protection.  UTD on eye exam, foot exam.  Has had recent steroids due to ongoing knee issue.  Denies symptomatic lows.  No numbness/tingling of hands/feet.  Hyperlipidemia- chronic problem, on Lipitor 10mg  daily.  Denies abd pain, N/V.  HTN-  Chronic problem, on Lisinopril 10mg  daily.  BP is mildly elevated today.  Denies CP, SOB, HAs, visual changes.   Review of Systems For ROS see HPI   This visit occurred during the SARS-CoV-2 public health emergency.  Safety protocols were in place, including screening questions prior to the visit, additional usage of staff PPE, and extensive cleaning of exam room while observing appropriate contact time as indicated for disinfecting solutions.       Objective:   Physical Exam Vitals reviewed.  Constitutional:      General: She is not in acute distress.    Appearance: Normal appearance. She is well-developed. She is obese.  HENT:     Head: Normocephalic and atraumatic.  Eyes:     Conjunctiva/sclera: Conjunctivae normal.     Pupils: Pupils are equal, round, and reactive to light.  Neck:     Thyroid: No thyromegaly.  Cardiovascular:     Rate and Rhythm: Normal rate and regular rhythm.     Pulses: Normal pulses.     Heart sounds: Normal heart sounds. No murmur heard.   Pulmonary:     Effort: Pulmonary effort is normal. No respiratory distress.     Breath sounds: Normal breath sounds.  Abdominal:     General: There is no distension.     Palpations: Abdomen is soft.     Tenderness: There is no abdominal tenderness.  Musculoskeletal:     Cervical back: Normal range of motion and neck supple.     Right lower leg: No edema.     Left lower leg: No edema.  Lymphadenopathy:     Cervical: No cervical adenopathy.  Skin:    General: Skin is warm and dry.  Neurological:      Mental Status: She is alert and oriented to person, place, and time.  Psychiatric:        Behavior: Behavior normal.           Assessment & Plan:

## 2020-10-11 NOTE — Assessment & Plan Note (Signed)
Chronic problem.  BP is mildly elevated today but pt is on crutches and in pain.  Asymptomatic on Lisinopril.  Check labs.  No anticipated med changes.  Will follow.

## 2020-10-11 NOTE — Assessment & Plan Note (Signed)
Chronic problem.  On Metformin 500mg  BID.  On ACE for renal protection, UTD on eye exam, foot exam.  Recent steroids have likely caused increased sugars.  Check labs.  Adjust meds prn

## 2020-10-11 NOTE — Assessment & Plan Note (Signed)
Pt's BMI 41.47  Stressed need for low carb diabetic diet and physical activity as able.  Will continue to follow.

## 2020-10-17 ENCOUNTER — Other Ambulatory Visit (HOSPITAL_BASED_OUTPATIENT_CLINIC_OR_DEPARTMENT_OTHER): Payer: Self-pay

## 2020-10-28 ENCOUNTER — Other Ambulatory Visit (HOSPITAL_COMMUNITY): Payer: Self-pay

## 2020-10-28 MED FILL — Lisinopril Tab 10 MG: ORAL | 90 days supply | Qty: 90 | Fill #0 | Status: AC

## 2020-10-29 ENCOUNTER — Telehealth: Payer: Self-pay | Admitting: Orthopaedic Surgery

## 2020-10-29 NOTE — Telephone Encounter (Signed)
Patient submitted medical release form, FMLA, and $25.00 cash payment to [employer]. Accepted 10/29/20

## 2020-11-07 ENCOUNTER — Encounter: Payer: Self-pay | Admitting: *Deleted

## 2020-11-07 ENCOUNTER — Telehealth: Payer: Self-pay | Admitting: *Deleted

## 2020-11-07 ENCOUNTER — Telehealth: Payer: Self-pay | Admitting: Orthopaedic Surgery

## 2020-11-07 ENCOUNTER — Other Ambulatory Visit (HOSPITAL_COMMUNITY): Payer: Self-pay

## 2020-11-07 DIAGNOSIS — Z86010 Personal history of colonic polyps: Secondary | ICD-10-CM

## 2020-11-07 MED ORDER — SUTAB 1479-225-188 MG PO TABS
ORAL_TABLET | ORAL | 0 refills | Status: DC
  Filled 2020-11-07: qty 24, 1d supply, fill #0

## 2020-11-07 NOTE — Telephone Encounter (Signed)
Dr Hilarie Fredrickson has requested that patient be scheduled for 3 year recall colonoscopy on 01/01/21 at 10 am with sutab.   I have scheduled procedure, sent instructions via mychart and have created referral in epic. In addition, I have sent Sutab to the pharmacy.  Per Dr Hilarie Fredrickson, no previsit needed.

## 2020-11-07 NOTE — Telephone Encounter (Signed)
[employer] forms received. Sent to [employer]. 

## 2020-11-08 ENCOUNTER — Other Ambulatory Visit (HOSPITAL_COMMUNITY): Payer: Self-pay

## 2020-11-14 ENCOUNTER — Other Ambulatory Visit (HOSPITAL_COMMUNITY): Payer: Self-pay

## 2020-11-14 MED FILL — Metformin HCl Tab 500 MG: ORAL | 90 days supply | Qty: 180 | Fill #0 | Status: AC

## 2020-11-19 ENCOUNTER — Other Ambulatory Visit: Payer: Self-pay

## 2020-11-20 ENCOUNTER — Other Ambulatory Visit: Payer: Self-pay | Admitting: Physician Assistant

## 2020-11-27 ENCOUNTER — Ambulatory Visit (INDEPENDENT_AMBULATORY_CARE_PROVIDER_SITE_OTHER): Payer: 59 | Admitting: Orthopaedic Surgery

## 2020-11-27 ENCOUNTER — Encounter: Payer: Self-pay | Admitting: Orthopaedic Surgery

## 2020-11-27 ENCOUNTER — Ambulatory Visit (INDEPENDENT_AMBULATORY_CARE_PROVIDER_SITE_OTHER): Payer: 59

## 2020-11-27 ENCOUNTER — Telehealth: Payer: Self-pay | Admitting: Orthopaedic Surgery

## 2020-11-27 DIAGNOSIS — M25561 Pain in right knee: Secondary | ICD-10-CM

## 2020-11-27 NOTE — Progress Notes (Signed)
The patient was not seen.  She was here today only for x-rays of her knee for preoperative planning for knee replacement surgery.

## 2020-11-27 NOTE — Telephone Encounter (Signed)
2 nd set Patient submitted medical release form, disability claim form, and $25.00 cash payment to [employer]. Accepted 11/27/20

## 2020-11-28 NOTE — Patient Instructions (Addendum)
DUE TO COVID-19 ONLY ONE VISITOR IS ALLOWED TO COME WITH YOU AND STAY IN THE WAITING ROOM ONLY DURING PRE OP AND PROCEDURE.   **NO VISITORS ARE ALLOWED IN THE SHORT STAY AREA OR RECOVERY ROOM!!**  IF YOU WILL BE ADMITTED INTO THE HOSPITAL YOU ARE ALLOWED ONLY TWO SUPPORT PEOPLE DURING VISITATION HOURS ONLY (10AM -8PM)   . The support person(s) may change daily. . The support person(s) must pass our screening, gel in and out, and wear a mask at all times, including in the patient's room. . Patients must also wear a mask when staff or their support person are in the room.  No visitors under the age of 62. Any visitor under the age of 2 must be accompanied by an adult.    COVID SWAB TESTING MUST BE COMPLETED ON:  Thursday, 12-05-20 @ 9:00 AM   [address] [place], [place] [address]  (Must self quarantine after testing. Follow instructions on handout.)        Your procedure is scheduled on: Friday, 12-06-20   Report to [facility] Main  Entrance    Report to admitting at 6:00 AM   Call this number if you have problems the morning of surgery [phone]   Do not eat food :After Midnight.   May have liquids until 5:30 AM day of surgery  CLEAR LIQUID DIET  Foods Allowed                                                                     Foods Excluded  Water, Black Coffee and tea, regular and decaf              liquids that you cannot  Plain Jell-O in any flavor  (No red)                                     see through such as: Fruit ices (not with fruit pulp)                                      milk, soups, orange juice              Iced Popsicles (No red)                                      All solid food                                   Apple juices Sports drinks like Gatorade (No red) Lightly seasoned clear broth or consume(fat free) Sugar, honey syrup    Oral Hygiene is also important to reduce your risk of infection.                                    Remember  - BRUSH YOUR TEETH THE MORNING OF SURGERY WITH  YOUR REGULAR TOOTHPASTE   Do NOT smoke after Midnight   Take these medicines the morning of surgery with A SIP OF WATER:  Omeprazole  DO NOT TAKE ANY ORAL DIABETIC MEDICATIONS DAY OF YOUR SURGERY                               You may not have any metal on your body including hair pins, jewelry, and body piercings             Do not wear make-up, lotions, powders, perfumes/cologne, or deodorant             Do not wear nail polish.  Do not shave  48 hours prior to surgery.             Do not bring valuables to the hospital. [place].   Contacts, dentures or bridgework may not be worn into surgery.   Bring small overnight bag day of surgery.    Special Instructions: Bring a copy of your healthcare power of attorney and living will documents         the day of surgery if you haven't  scanned them in before.              Please read over the following fact sheets you were given: IF YOU HAVE QUESTIONS ABOUT YOUR PRE OP INSTRUCTIONS PLEASE CALL  [facility] - Preparing for Surgery Before surgery, you can play an important role.  Because skin is not sterile, your skin needs to be as free of germs as possible.  You can reduce the number of germs on your skin by washing with CHG (chlorahexidine gluconate) soap before surgery.  CHG is an antiseptic cleaner which kills germs and bonds with the skin to continue killing germs even after washing. Please DO NOT use if you have an allergy to CHG or antibacterial soaps.  If your skin becomes reddened/irritated stop using the CHG and inform your nurse when you arrive at Short Stay. Do not shave (including legs and underarms) for at least 48 hours prior to the first CHG shower.  You may shave your face/neck.  Please follow these instructions carefully:  1.  Shower with CHG Soap the night before surgery and the  morning of surgery.  2.  If you choose to  wash your hair, wash your hair first as usual with your normal  shampoo.  3.  After you shampoo, rinse your hair and body thoroughly to remove the shampoo.                             4.  Use CHG as you would any other liquid soap.  You can apply chg directly to the skin and wash.  Gently with a scrungie or clean washcloth.  5.  Apply the CHG Soap to your body ONLY FROM THE NECK DOWN.   Do   not use on face/ open                           Wound or open sores. Avoid contact with eyes, ears mouth and   genitals (private parts).                       Wash face,  Genitals (private parts) with your normal soap.             6.  Wash thoroughly, paying special attention to the area where your    surgery  will be performed.  7.  Thoroughly rinse your body with warm water from the neck down.  8.  DO NOT shower/wash with your normal soap after using and rinsing off the CHG Soap.                9.  Pat yourself dry with a clean towel.            10.  Wear clean pajamas.            11.  Place clean sheets on your bed the night of your first shower and do not  sleep with pets. Day of Surgery : Do not apply any lotions/deodorants the morning of surgery.  Please wear clean clothes to the hospital/surgery center.  FAILURE TO FOLLOW THESE INSTRUCTIONS MAY RESULT IN THE CANCELLATION OF YOUR SURGERY  PATIENT SIGNATURE_________________________________  NURSE SIGNATURE__________________________________  ________________________________________________________________________   Rogelia MireIncentive Spirometer  An incentive spirometer is a tool that can help keep your lungs clear and active. This tool measures how well you are filling your lungs with each breath. Taking long deep breaths may help reverse or decrease the chance of developing breathing (pulmonary) problems (especially infection) following:  A long period of time when you are unable to move or be active. BEFORE THE PROCEDURE   If the spirometer includes  an indicator to show your best effort, your nurse or respiratory therapist will set it to a desired goal.  If possible, sit up straight or lean slightly forward. Try not to slouch.  Hold the incentive spirometer in an upright position. INSTRUCTIONS FOR USE  1. Sit on the edge of your bed if possible, or sit up as far as you can in bed or on a chair. 2. Hold the incentive spirometer in an upright position. 3. Breathe out normally. 4. Place the mouthpiece in your mouth and seal your lips tightly around it. 5. Breathe in slowly and as deeply as possible, raising the piston or the ball toward the top of the column. 6. Hold your breath for 3-5 seconds or for as long as possible. Allow the piston or ball to fall to the bottom of the column. 7. Remove the mouthpiece from your mouth and breathe out normally. 8. Rest for a few seconds and repeat Steps 1 through 7 at least 10 times every 1-2 hours when you are awake. Take your time and take a few normal breaths between deep breaths. 9. The spirometer may include an indicator to show your best effort. Use the indicator as a goal to work toward during each repetition. 10. After each set of 10 deep breaths, practice coughing to be sure your lungs are clear. If you have an incision (the cut made at the time of surgery), support your incision when coughing by placing a pillow or rolled up towels firmly against it. Once you are able to get out of bed, walk around indoors and cough well. You may stop using the incentive spirometer when instructed by your caregiver.  RISKS AND COMPLICATIONS  Take your time so you do not get dizzy or light-headed.  If you are in pain, you may need to take or ask for pain medication before doing incentive spirometry. It is harder to take a deep breath if you are having pain.  AFTER USE  Rest and breathe slowly and easily.  It can be helpful to keep track of a log of your progress. Your caregiver can provide you with a simple  table to help with this. If you are using the spirometer at home, follow these instructions: [place] IF:   You are having difficultly using the spirometer.  You have trouble using the spirometer as often as instructed.  Your pain medication is not giving enough relief while using the spirometer.  You develop fever of 100.5 F (38.1 C) or higher. SEEK IMMEDIATE MEDICAL CARE IF:   You cough up bloody sputum that had not been present before.  You develop fever of 102 F (38.9 C) or greater.  You develop worsening pain at or near the incision site. MAKE SURE YOU:   Understand these instructions.  Will watch your condition.  Will get help right away if you are not doing well or get worse. Document Released: 11/23/2006 Document Revised: 10/05/2011 Document Reviewed: 01/24/2007 ExitCare Patient Information 2014 ExitCare, [place].   ________________________________________________________________________  WHAT IS A BLOOD TRANSFUSION? Blood Transfusion Information  A transfusion is the replacement of blood or some of its parts. Blood is made up of multiple cells which provide different functions.  Red blood cells carry oxygen and are used for blood loss replacement.  White blood cells fight against infection.  Platelets control bleeding.  Plasma helps clot blood.  Other blood products are available for specialized needs, such as hemophilia or other clotting disorders. BEFORE THE TRANSFUSION  Who gives blood for transfusions?   Healthy volunteers who are fully evaluated to make sure their blood is safe. This is blood bank blood. Transfusion therapy is the safest it has ever been in the practice of medicine. Before blood is taken from a donor, a complete history is taken to make sure that person has no history of diseases nor engages in risky social behavior (examples are intravenous drug use or sexual activity with multiple partners). The donor's travel history is  screened to minimize risk of transmitting infections, such as malaria. The donated blood is tested for signs of infectious diseases, such as HIV and hepatitis. The blood is then tested to be sure it is compatible with you in order to minimize the chance of a transfusion reaction. If you or a relative donates blood, this is often done in anticipation of surgery and is not appropriate for emergency situations. It takes many days to process the donated blood. RISKS AND COMPLICATIONS Although transfusion therapy is very safe and saves many lives, the main dangers of transfusion include:   Getting an infectious disease.  Developing a transfusion reaction. This is an allergic reaction to something in the blood you were given. Every precaution is taken to prevent this. The decision to have a blood transfusion has been considered carefully by your caregiver before blood is given. Blood is not given unless the benefits outweigh the risks. AFTER THE TRANSFUSION  Right after receiving a blood transfusion, you will usually feel much better and more energetic. This is especially true if your red blood cells have gotten low (anemic). The transfusion raises the level of the red blood cells which carry oxygen, and this usually causes an energy increase.  The nurse administering the transfusion will monitor you carefully for complications. HOME CARE INSTRUCTIONS  No special instructions are needed after a transfusion. You may find your energy is better. Speak with your caregiver about any limitations on activity for underlying diseases  you may have. SEEK MEDICAL CARE IF:   Your condition is not improving after your transfusion.  You develop redness or irritation at the intravenous (IV) site. SEEK IMMEDIATE MEDICAL CARE IF:  Any of the following symptoms occur over the next 12 hours:  Shaking chills.  You have a temperature by mouth above 102 F (38.9 C), not controlled by medicine.  Chest, back, or muscle  pain.  People around you feel you are not acting correctly or are confused.  Shortness of breath or difficulty breathing.  Dizziness and fainting.  You get a rash or develop hives.  You have a decrease in urine output.  Your urine turns a dark color or changes to pink, red, or brown. Any of the following symptoms occur over the next 10 days:  You have a temperature by mouth above 102 F (38.9 C), not controlled by medicine.  Shortness of breath.  Weakness after normal activity.  The white part of the eye turns yellow (jaundice).  You have a decrease in the amount of urine or are urinating less often.  Your urine turns a dark color or changes to pink, red, or brown. Document Released: 07/10/2000 Document Revised: 10/05/2011 Document Reviewed: 02/27/2008 [facility] Patient Information 2014 [place], [place].  _______________________________________________________________________

## 2020-11-28 NOTE — Progress Notes (Addendum)
COVID Vaccine Completed: x3 Date COVID Vaccine completed: 07-24-19, 08-14-19 Has received booster: 05-15-20 COVID vaccine manufacturer: [employer]   Date of COVID positive in last 90 days:  N/A  PCP - Annye Asa, MD Cardiologist - N/A  Chest x-ray - N/A EKG - 06-07-20 Epic Stress Test - N/a ECHO - N/A Cardiac Cath - N/A Pacemaker/ICD device last checked: Spinal Cord Stimulator:  Sleep Study - N/A CPAP -   Fasting Blood Sugar - Do not check Checks Blood Sugar _____ times a day  Blood Thinner Instructions:  N/A Aspirin Instructions: Last Dose:  Activity level:  Can go up a flight of stairs and perform activities of daily living without stopping and without symptoms of chest pain or shortness of breath.    Anesthesia review:  A1c 7.6.  Konrad Felix, PA-C made aware  Patient denies shortness of breath, fever, cough and chest pain at PAT appointment   Patient verbalized understanding of instructions that were given to them at the PAT appointment. Patient was also instructed that they will need to review over the PAT instructions again at home before surgery.

## 2020-11-29 ENCOUNTER — Encounter (HOSPITAL_COMMUNITY): Payer: Self-pay

## 2020-11-29 ENCOUNTER — Encounter (HOSPITAL_COMMUNITY)
Admission: RE | Admit: 2020-11-29 | Discharge: 2020-11-29 | Disposition: A | Discharge status: Home / Self Care | Payer: 59 | Source: Ambulatory Visit | Attending: Orthopaedic Surgery | Admitting: Orthopaedic Surgery

## 2020-11-29 ENCOUNTER — Other Ambulatory Visit: Payer: Self-pay

## 2020-11-29 DIAGNOSIS — Z01812 Encounter for preprocedural laboratory examination: Secondary | ICD-10-CM | POA: Diagnosis not present

## 2020-11-29 LAB — GLUCOSE, CAPILLARY: Glucose-Capillary: 225 mg/dL — ABNORMAL HIGH (ref 70–99)

## 2020-11-29 LAB — SURGICAL PCR SCREEN
MRSA, PCR: NEGATIVE
Staphylococcus aureus: NEGATIVE

## 2020-11-29 LAB — BASIC METABOLIC PANEL
Anion gap: 8 (ref 5–15)
BUN: 12 mg/dL (ref 6–20)
CO2: 26 mmol/L (ref 22–32)
Calcium: 9.5 mg/dL (ref 8.9–10.3)
Chloride: 104 mmol/L (ref 98–111)
Creatinine, Ser: 0.71 mg/dL (ref 0.44–1.00)
GFR, Estimated: >60 mL/min (ref >60)
Glucose, Bld: 183 mg/dL — ABNORMAL HIGH (ref 70–99)
Potassium: 3.9 mmol/L (ref 3.5–5.1)
Sodium: 138 mmol/L (ref 135–145)

## 2020-11-29 LAB — HEMOGLOBIN A1C
Hgb A1c MFr Bld: 7.6 % — ABNORMAL HIGH (ref 4.8–5.6)
Mean Plasma Glucose: 171.42 mg/dL

## 2020-11-29 LAB — CBC
HCT: 38.8 % (ref 36.0–46.0)
Hemoglobin: 12.6 g/dL (ref 12.0–15.0)
MCH: 28.4 pg (ref 26.0–34.0)
MCHC: 32.5 g/dL (ref 30.0–36.0)
MCV: 87.4 fL (ref 80.0–100.0)
Platelets: 381 10*3/uL (ref 150–400)
RBC: 4.44 MIL/uL (ref 3.87–5.11)
RDW: 14.1 % (ref 11.5–15.5)
WBC: 12 10*3/uL — ABNORMAL HIGH (ref 4.0–10.5)
nRBC: 0 % (ref 0.0–0.2)

## 2020-12-05 ENCOUNTER — Other Ambulatory Visit (HOSPITAL_COMMUNITY)
Admission: RE | Admit: 2020-12-05 | Discharge: 2020-12-05 | Disposition: A | Discharge status: Home / Self Care | Payer: 59 | Source: Ambulatory Visit | Attending: Orthopaedic Surgery | Admitting: Orthopaedic Surgery

## 2020-12-05 DIAGNOSIS — Z20822 Contact with and (suspected) exposure to covid-19: Secondary | ICD-10-CM | POA: Diagnosis not present

## 2020-12-05 DIAGNOSIS — Z01812 Encounter for preprocedural laboratory examination: Secondary | ICD-10-CM | POA: Insufficient documentation

## 2020-12-05 DIAGNOSIS — M1711 Unilateral primary osteoarthritis, right knee: Secondary | ICD-10-CM

## 2020-12-05 LAB — SARS CORONAVIRUS 2 (TAT 6-24 HRS): SARS Coronavirus 2: NEGATIVE

## 2020-12-05 MED ORDER — VANCOMYCIN HCL 1500 MG/300ML IV SOLN
1500.0000 mg | INTRAVENOUS | Status: AC
  Administered 2020-12-06: 1500 mg via INTRAVENOUS
  Filled 2020-12-05: qty 300

## 2020-12-05 NOTE — H&P (Signed)
TOTAL KNEE ADMISSION H&P  Patient is being admitted for right total knee arthroplasty.  Subjective:  Chief Complaint:right knee pain.  HPI: Jamie Hall, 55 y.o. female, has a history of pain and functional disability in the right knee due to arthritis and has failed non-surgical conservative treatments for greater than 12 weeks to includeNSAID's and/or analgesics, corticosteriod injections, viscosupplementation injections, flexibility and strengthening excercises, use of assistive devices, weight reduction as appropriate and activity modification.  Onset of symptoms was abrupt, starting 1 years ago with rapidlly worsening course since that time. The patient noted prior procedures on the knee to include  arthroscopy and ACL reconstruction on the right knee(s).  Patient currently rates pain in the right knee(s) at 10 out of 10 with activity. Patient has night pain, worsening of pain with activity and weight bearing, pain that interferes with activities of daily living, pain with passive range of motion, crepitus and joint swelling.  Patient has evidence of subchondral sclerosis, periarticular osteophytes and joint space narrowing by imaging studies. There is no active infection.  Patient Active Problem List   Diagnosis Date Noted  . Arthritis of right knee 12/05/2020  . Acute tear medial meniscus, right, subsequent encounter 08/16/2020  . Hyperlipidemia associated with type 2 diabetes mellitus ([place]) 08/18/2019  . Physical exam 02/03/2019  . Diabetes mellitus without complication ([place]) 16/07/930  . Morbid obesity ([place]) 01/17/2018  . HTN (hypertension) 01/17/2018  . Cluster headaches 01/17/2018  . Basal cell carcinoma of skin 02/18/2012   Past Medical History:  Diagnosis Date  . DM type 2 (diabetes mellitus, type 2) ([place])    followed by pcp  . GERD (gastroesophageal reflux disease)   . Headache   . History of kidney stones   . Hypertension   . OA (osteoarthritis)   . PONV  (postoperative nausea and vomiting)   . Right knee meniscal tear     Past Surgical History:  Procedure Laterality Date  . BREAST REDUCTION SURGERY  2006  . CARPECTOMY Left 2007   proximal row, wrist  . CYSTOSCOPY/URETEROSCOPY/HOLMIUM LASER/STENT PLACEMENT Left 06/07/2020   Procedure: CYSTOSCOPY/RETROGRADE/URETEROSCOPY/HOLMIUM LASER/STENT PLACEMENT;  Surgeon: Ceasar Mons, MD;  Location: [facility];  Service: Urology;  Laterality: Left;  ONLY NEEDS 45 MIN  . GANGLION CYST EXCISION Right 1999   wrist  . KIDNEY STONE SURGERY  06/07/2020  . KNEE ARTHROSCOPY Right 2000  . KNEE ARTHROSCOPY Right 08/16/2020   Procedure: RIGHT KNEE ARTHROSCOPY WITH PARTIAL MEDIAL MENISCECTOMY;  Surgeon: Mcarthur Rossetti, MD;  Location: [facility];  Service: Orthopedics;  Laterality: Right;  . TONSILLECTOMY  age 30    Current Facility-Administered Medications  Medication Dose Route Frequency Provider Last Rate Last Admin  . [START ON 12/06/2020] vancomycin (VANCOREADY) IVPB 1500 mg/300 mL  1,500 mg Intravenous On Call to [place], Carnot[place]Moon, MD       Current Outpatient Medications  Medication Sig Dispense Refill Last Dose  . atorvastatin (LIPITOR) 10 MG tablet TAKE 1 TABLET BY MOUTH AT BEDTIME (Patient taking differently: Take 10 mg by mouth at bedtime.) 90 tablet 0   . Cholecalciferol (VITAMIN D3) 125 MCG (5000 UT) CAPS Take 5,000 Units by mouth daily.     Marland Kitchen levonorgestrel (MIRENA) 20 MCG/24HR IUD 1 each by Intrauterine route once.     Marland Kitchen lisinopril (ZESTRIL) 10 MG tablet TAKE 1 TABLET BY MOUTH ONCE DAILY (Patient taking differently: Take 10 mg by mouth daily.) 90 tablet 1   . metFORMIN (GLUCOPHAGE) 500 MG  tablet TAKE 1 TABLET BY MOUTH TWO TIMES DAILY WITH MEALS (Patient taking differently: Take 500 mg by mouth 2 (two) times daily with a meal.) 180 tablet 1   . omeprazole (PRILOSEC) 20 MG capsule TAKE 1 CAPSULE BY MOUTH ONCE DAILY (Patient taking  differently: Take 20 mg by mouth every other day.) 90 capsule 1   . atorvastatin (LIPITOR) 10 MG tablet TAKE 1 TABLET BY MOUTH AT BEDTIME (Patient not taking: Reported on 11/19/2020) 90 tablet 0 Not Taking at Unknown time  . ciprofloxacin (CIPRO) 500 MG tablet TAKE 1 TABLET BY MOUTH TWO TIMES DAILY FOR 3 DAYS (Patient not taking: Reported on 11/19/2020) 6 tablet 0 Not Taking at Unknown time  . HYDROcodone-acetaminophen (NORCO/VICODIN) 5-325 MG tablet TAKE 1 TABLET BY MOUTH EVERY 6 HOURS AS NEEDED FOR MODERATE PAIN (Patient not taking: No sig reported) 30 tablet 0 Not Taking at Unknown time  . meloxicam (MOBIC) 15 MG tablet TAKE 1 TABLET BY MOUTH ONCE A DAY WITH A MEAL (Patient not taking: Reported on 11/19/2020) 30 tablet 1 Not Taking at Unknown time  . Sodium Sulfate-Mag Sulfate-KCl (SUTAB) [phone] MG TABS Use as directed for colonoscopy. MANUFACTURER CODES!! BIN: [id number] PCN: CN GROUP: [id number] MEMBER ID: [id number];[employer] AS SECONDARY INSURANCE ;NO PRIOR AUTHORIZATION 24 tablet 0   . tamsulosin (FLOMAX) 0.4 MG CAPS capsule TAKE 1 CAPSULE BY MOUTH EVERY DAY AT BEDTIME (Patient not taking: Reported on 11/19/2020) 30 capsule 1 Not Taking at Unknown time   Allergies  Allergen Reactions  . Bee Venom     Other reaction(s): EDEMA  . Benzoyl Peroxide Other (See Comments)    Makes eyes shut  . Ceftin [Cefuroxime] Swelling    Throat swelling  . Clindamycin/Lincomycin Hives  . Codeine Hives  . Dilaudid [Hydromorphone] Hives  . Hydrocodone     itching  . Oxycodone-Acetaminophen Hives  . Penicillins Hives  . Tape Other (See Comments)    Blisters all adhesives likes paper tape    Social History   Tobacco Use  . Smoking status: Never Smoker  . Smokeless tobacco: Never Used  Substance Use Topics  . Alcohol use: Not Currently    Family History  Problem Relation Age of Onset  . Early death Mother   . Stroke Mother   . Healthy Father   . Healthy Sister   . Healthy Daughter   . Cancer Maternal  Grandfather   . Drug abuse Maternal Grandfather   . Hypertension Maternal Grandfather   . Colon cancer Neg Hx   . Esophageal cancer Neg Hx   . Liver cancer Neg Hx   . Pancreatic cancer Neg Hx   . Rectal cancer Neg Hx   . Stomach cancer Neg Hx      Review of Systems  Musculoskeletal: Positive for gait problem and joint swelling.  All other systems reviewed and are negative.   Objective:  Physical Exam Vitals reviewed.  Constitutional:      Appearance: Normal appearance.  HENT:     Head: Normocephalic and atraumatic.  Eyes:     Extraocular Movements: Extraocular movements intact.     Pupils: Pupils are equal, round, and reactive to light.  Cardiovascular:     Rate and Rhythm: Normal rate and regular rhythm.     Pulses: Normal pulses.  Pulmonary:     Effort: Pulmonary effort is normal.     Breath sounds: Normal breath sounds.  Abdominal:     Palpations: Abdomen is soft.  Musculoskeletal:     Cervical  back: Normal range of motion.     Right knee: Effusion, bony tenderness and crepitus present. Decreased range of motion. Tenderness present over the medial joint line, lateral joint line and patellar tendon. ACL laxity present. Abnormal alignment and abnormal meniscus.  Neurological:     Mental Status: She is alert and oriented to person, place, and time.  Psychiatric:        Behavior: Behavior normal.     Vital signs in last 24 hours:    Labs:   Estimated body mass index is 42.27 kg/m as calculated from the following:   Height as of 11/29/20: 5\' 3"  (1.6 m).   Weight as of 11/29/20: 108.2 kg.   Imaging Review Plain radiographs demonstrate severe degenerative joint disease of the right knee(s). The overall alignment ismild varus. The bone quality appears to be excellent for age and reported activity level.      Assessment/Plan:  End stage arthritis, right knee   The patient history, physical examination, clinical judgment of the provider and imaging studies are  consistent with end stage degenerative joint disease of the right knee(s) and total knee arthroplasty is deemed medically necessary. The treatment options including medical management, injection therapy arthroscopy and arthroplasty were discussed at length. The risks and benefits of total knee arthroplasty were presented and reviewed. The risks due to aseptic loosening, infection, stiffness, patella tracking problems, thromboembolic complications and other imponderables were discussed. The patient acknowledged the explanation, agreed to proceed with the plan and consent was signed. Patient is being admitted for inpatient treatment for surgery, pain control, PT, OT, prophylactic antibiotics, VTE prophylaxis, progressive ambulation and ADL's and discharge planning. The patient is planning to be discharged home with home health services

## 2020-12-05 NOTE — Anesthesia Preprocedure Evaluation (Addendum)
Anesthesia Evaluation  Patient identified by MRN, date of birth, ID band Patient awake    Reviewed: Allergy & Precautions, NPO status , Patient's Chart, lab work & pertinent test results  History of Anesthesia Complications (+) PONV  Airway Mallampati: II  TM Distance: >3 FB Neck ROM: Full    Dental no notable dental hx. (+) Teeth Intact, Dental Advisory Given   Pulmonary neg pulmonary ROS,    Pulmonary exam normal breath sounds clear to auscultation       Cardiovascular hypertension, Normal cardiovascular exam Rhythm:Regular Rate:Normal     Neuro/Psych negative neurological ROS     GI/Hepatic GERD  ,  Endo/Other  diabetes, Well Controlled, Type 2, Oral Hypoglycemic Agents  Renal/GU      Musculoskeletal   Abdominal   Peds  Hematology negative hematology ROS (+) Lab Results      Component                Value               Date                      WBC                      12.0 (H)            11/29/2020                HGB                      12.6                11/29/2020                HCT                      38.8                11/29/2020                MCV                      87.4                11/29/2020                PLT                      381                 11/29/2020              Anesthesia Other Findings All : see list  Reproductive/Obstetrics                           Anesthesia Physical Anesthesia Plan  ASA: III  Anesthesia Plan: Spinal   Post-op Pain Management:    Induction:   PONV Risk Score and Plan: 4 or greater and Treatment may vary due to age or medical condition, Midazolam, Dexamethasone and Ondansetron  Airway Management Planned: Natural Airway and Nasal Cannula  Additional Equipment: None  Intra-op Plan:   Post-operative Plan:   Informed Consent: I have reviewed the patients History and Physical, chart, labs and discussed the procedure including  the risks, benefits and alternatives for the proposed anesthesia with the patient or  authorized representative who has indicated his/her understanding and acceptance.     Dental advisory given  Plan Discussed with: CRNA  Anesthesia Plan Comments: (Sp w R Adductor canal Block)       Anesthesia Quick Evaluation

## 2020-12-06 ENCOUNTER — Other Ambulatory Visit: Payer: Self-pay

## 2020-12-06 ENCOUNTER — Observation Stay (HOSPITAL_COMMUNITY)
Admission: RE | Admit: 2020-12-06 | Discharge: 2020-12-07 | Disposition: A | Discharge status: Home / Self Care | Payer: 59 | Attending: Orthopaedic Surgery | Admitting: Orthopaedic Surgery

## 2020-12-06 ENCOUNTER — Encounter (HOSPITAL_COMMUNITY): Payer: Self-pay | Admitting: Orthopaedic Surgery

## 2020-12-06 ENCOUNTER — Ambulatory Visit (HOSPITAL_COMMUNITY): Payer: 59 | Admitting: Anesthesiology

## 2020-12-06 ENCOUNTER — Ambulatory Visit (HOSPITAL_COMMUNITY): Payer: 59 | Admitting: Physician Assistant

## 2020-12-06 ENCOUNTER — Observation Stay (HOSPITAL_COMMUNITY): Payer: 59

## 2020-12-06 ENCOUNTER — Encounter (HOSPITAL_COMMUNITY)
Admission: RE | Disposition: A | Discharge status: Home / Self Care | Payer: Self-pay | Source: Home / Self Care | Attending: Orthopaedic Surgery

## 2020-12-06 DIAGNOSIS — Z96651 Presence of right artificial knee joint: Secondary | ICD-10-CM

## 2020-12-06 DIAGNOSIS — Z85828 Personal history of other malignant neoplasm of skin: Secondary | ICD-10-CM | POA: Insufficient documentation

## 2020-12-06 DIAGNOSIS — I10 Essential (primary) hypertension: Secondary | ICD-10-CM | POA: Diagnosis not present

## 2020-12-06 DIAGNOSIS — M1711 Unilateral primary osteoarthritis, right knee: Secondary | ICD-10-CM | POA: Diagnosis not present

## 2020-12-06 DIAGNOSIS — G8918 Other acute postprocedural pain: Secondary | ICD-10-CM | POA: Diagnosis not present

## 2020-12-06 DIAGNOSIS — Z7984 Long term (current) use of oral hypoglycemic drugs: Secondary | ICD-10-CM | POA: Insufficient documentation

## 2020-12-06 DIAGNOSIS — Z79899 Other long term (current) drug therapy: Secondary | ICD-10-CM | POA: Diagnosis not present

## 2020-12-06 DIAGNOSIS — E119 Type 2 diabetes mellitus without complications: Secondary | ICD-10-CM | POA: Diagnosis not present

## 2020-12-06 HISTORY — PX: TOTAL KNEE ARTHROPLASTY: SHX125

## 2020-12-06 LAB — ABO/RH: ABO/RH(D): A POS

## 2020-12-06 LAB — GLUCOSE, CAPILLARY
Glucose-Capillary: 135 mg/dL — ABNORMAL HIGH (ref 70–99)
Glucose-Capillary: 169 mg/dL — ABNORMAL HIGH (ref 70–99)

## 2020-12-06 LAB — TYPE AND SCREEN
ABO/RH(D): A POS
Antibody Screen: NEGATIVE

## 2020-12-06 SURGERY — ARTHROPLASTY, KNEE, TOTAL
Anesthesia: Spinal | Site: Knee | Laterality: Right

## 2020-12-06 MED ORDER — PROPOFOL 500 MG/50ML IV EMUL
INTRAVENOUS | Status: DC | PRN
  Administered 2020-12-06: 100 ug/kg/min via INTRAVENOUS

## 2020-12-06 MED ORDER — METFORMIN HCL 500 MG PO TABS
500.0000 mg | ORAL_TABLET | Freq: Two times a day (BID) | ORAL | Status: DC
  Administered 2020-12-06 – 2020-12-07 (×2): 500 mg via ORAL
  Filled 2020-12-06 (×2): qty 1

## 2020-12-06 MED ORDER — LIDOCAINE HCL (CARDIAC) PF 100 MG/5ML IV SOSY
PREFILLED_SYRINGE | INTRAVENOUS | Status: DC | PRN
  Administered 2020-12-06: 60 mg via INTRAVENOUS

## 2020-12-06 MED ORDER — VITAMIN D 25 MCG (1000 UNIT) PO TABS
5000.0000 [IU] | ORAL_TABLET | Freq: Every day | ORAL | Status: DC
  Administered 2020-12-07: 5000 [IU] via ORAL
  Filled 2020-12-06: qty 5

## 2020-12-06 MED ORDER — ACETAMINOPHEN 10 MG/ML IV SOLN
INTRAVENOUS | Status: AC
  Filled 2020-12-06: qty 100

## 2020-12-06 MED ORDER — DEXAMETHASONE SODIUM PHOSPHATE 10 MG/ML IJ SOLN
INTRAMUSCULAR | Status: DC | PRN
  Administered 2020-12-06: 4 mg via INTRAVENOUS

## 2020-12-06 MED ORDER — DEXAMETHASONE SODIUM PHOSPHATE 10 MG/ML IJ SOLN
INTRAMUSCULAR | Status: AC
  Filled 2020-12-06: qty 1

## 2020-12-06 MED ORDER — KETOROLAC TROMETHAMINE 15 MG/ML IJ SOLN
7.5000 mg | Freq: Four times a day (QID) | INTRAMUSCULAR | Status: AC
  Administered 2020-12-06 – 2020-12-07 (×3): 7.5 mg via INTRAVENOUS
  Filled 2020-12-06 (×3): qty 1

## 2020-12-06 MED ORDER — BUPIVACAINE-EPINEPHRINE 0.25% -1:200000 IJ SOLN
INTRAMUSCULAR | Status: DC | PRN
  Administered 2020-12-06: 30 mL

## 2020-12-06 MED ORDER — DROPERIDOL 2.5 MG/ML IJ SOLN
INTRAMUSCULAR | Status: AC
  Administered 2020-12-06: 0.625 mg via INTRAVENOUS
  Filled 2020-12-06: qty 2

## 2020-12-06 MED ORDER — ONDANSETRON HCL 4 MG PO TABS
4.0000 mg | ORAL_TABLET | Freq: Four times a day (QID) | ORAL | Status: DC | PRN
  Administered 2020-12-07: 4 mg via ORAL
  Filled 2020-12-06: qty 1

## 2020-12-06 MED ORDER — 0.9 % SODIUM CHLORIDE (POUR BTL) OPTIME
TOPICAL | Status: DC | PRN
  Administered 2020-12-06: 1000 mL

## 2020-12-06 MED ORDER — ASPIRIN 81 MG PO CHEW
81.0000 mg | CHEWABLE_TABLET | Freq: Two times a day (BID) | ORAL | Status: DC
  Administered 2020-12-06 – 2020-12-07 (×2): 81 mg via ORAL
  Filled 2020-12-06 (×2): qty 1

## 2020-12-06 MED ORDER — MIDAZOLAM HCL 2 MG/2ML IJ SOLN
INTRAMUSCULAR | Status: AC
  Administered 2020-12-06: 2 mg
  Filled 2020-12-06: qty 2

## 2020-12-06 MED ORDER — EPHEDRINE SULFATE-NACL 50-0.9 MG/10ML-% IV SOSY
PREFILLED_SYRINGE | INTRAVENOUS | Status: DC | PRN
  Administered 2020-12-06 (×2): 5 mg via INTRAVENOUS
  Administered 2020-12-06: 10 mg via INTRAVENOUS

## 2020-12-06 MED ORDER — ACETAMINOPHEN 10 MG/ML IV SOLN
1000.0000 mg | Freq: Once | INTRAVENOUS | Status: DC | PRN
  Administered 2020-12-06: 1000 mg via INTRAVENOUS

## 2020-12-06 MED ORDER — BUPIVACAINE-EPINEPHRINE (PF) 0.25% -1:200000 IJ SOLN
INTRAMUSCULAR | Status: AC
  Filled 2020-12-06: qty 30

## 2020-12-06 MED ORDER — MIDAZOLAM HCL 2 MG/2ML IJ SOLN
INTRAMUSCULAR | Status: AC
  Filled 2020-12-06: qty 2

## 2020-12-06 MED ORDER — PROPOFOL 10 MG/ML IV BOLUS
INTRAVENOUS | Status: DC | PRN
  Administered 2020-12-06 (×2): 20 mg via INTRAVENOUS

## 2020-12-06 MED ORDER — DOCUSATE SODIUM 100 MG PO CAPS
100.0000 mg | ORAL_CAPSULE | Freq: Two times a day (BID) | ORAL | Status: DC
  Administered 2020-12-06 – 2020-12-07 (×2): 100 mg via ORAL
  Filled 2020-12-06 (×2): qty 1

## 2020-12-06 MED ORDER — METOCLOPRAMIDE HCL 5 MG/ML IJ SOLN: 5.0000 mg | Freq: Three times a day (TID) | INTRAMUSCULAR | Status: DC | PRN

## 2020-12-06 MED ORDER — HYDROMORPHONE HCL 1 MG/ML IJ SOLN: 0.5000 mg | INTRAMUSCULAR | Status: DC | PRN

## 2020-12-06 MED ORDER — PHENYLEPHRINE 40 MCG/ML (10ML) SYRINGE FOR IV PUSH (FOR BLOOD PRESSURE SUPPORT)
PREFILLED_SYRINGE | INTRAVENOUS | Status: DC | PRN
  Administered 2020-12-06 (×11): 80 ug via INTRAVENOUS

## 2020-12-06 MED ORDER — VANCOMYCIN HCL 1000 MG/200ML IV SOLN
1000.0000 mg | Freq: Two times a day (BID) | INTRAVENOUS | Status: AC
  Administered 2020-12-06: 1000 mg via INTRAVENOUS
  Filled 2020-12-06 (×2): qty 200

## 2020-12-06 MED ORDER — PANTOPRAZOLE SODIUM 40 MG PO TBEC
40.0000 mg | DELAYED_RELEASE_TABLET | Freq: Every day | ORAL | Status: DC
  Administered 2020-12-07: 40 mg via ORAL
  Filled 2020-12-06: qty 1

## 2020-12-06 MED ORDER — METHOCARBAMOL 500 MG PO TABS
500.0000 mg | ORAL_TABLET | Freq: Four times a day (QID) | ORAL | Status: DC | PRN
  Administered 2020-12-07 (×2): 500 mg via ORAL
  Filled 2020-12-06 (×2): qty 1

## 2020-12-06 MED ORDER — SODIUM CHLORIDE 0.9 % IV SOLN: INTRAVENOUS | Status: DC

## 2020-12-06 MED ORDER — CLONIDINE HCL (ANALGESIA) 100 MCG/ML EP SOLN
EPIDURAL | Status: DC | PRN
  Administered 2020-12-06: 100 ug

## 2020-12-06 MED ORDER — SODIUM CHLORIDE 0.9 % IR SOLN
Status: DC | PRN
  Administered 2020-12-06: 1000 mL

## 2020-12-06 MED ORDER — ATORVASTATIN CALCIUM 10 MG PO TABS
10.0000 mg | ORAL_TABLET | Freq: Every day | ORAL | Status: DC
  Administered 2020-12-06: 10 mg via ORAL
  Filled 2020-12-06: qty 1

## 2020-12-06 MED ORDER — DIPHENHYDRAMINE HCL 12.5 MG/5ML PO ELIX
12.5000 mg | ORAL_SOLUTION | ORAL | Status: DC | PRN
Start: 2020-12-06 — End: 2020-12-07
  Administered 2020-12-06 – 2020-12-07 (×6): 25 mg via ORAL
  Filled 2020-12-06 (×6): qty 10

## 2020-12-06 MED ORDER — OXYCODONE HCL 5 MG/5ML PO SOLN: 5.0000 mg | Freq: Once | ORAL | Status: DC | PRN

## 2020-12-06 MED ORDER — BUPIVACAINE IN DEXTROSE 0.75-8.25 % IT SOLN
INTRATHECAL | Status: DC | PRN
  Administered 2020-12-06: 12 mg via INTRATHECAL

## 2020-12-06 MED ORDER — ONDANSETRON HCL 4 MG/2ML IJ SOLN
INTRAMUSCULAR | Status: AC
  Filled 2020-12-06: qty 2

## 2020-12-06 MED ORDER — HYDROMORPHONE HCL 1 MG/ML IJ SOLN
INTRAMUSCULAR | Status: AC
  Filled 2020-12-06: qty 1

## 2020-12-06 MED ORDER — KETOROLAC TROMETHAMINE 15 MG/ML IJ SOLN
7.5000 mg | Freq: Four times a day (QID) | INTRAMUSCULAR | Status: DC
  Administered 2020-12-06: 7.5 mg via INTRAVENOUS
  Filled 2020-12-06: qty 1

## 2020-12-06 MED ORDER — FENTANYL CITRATE (PF) 100 MCG/2ML IJ SOLN
INTRAMUSCULAR | Status: AC
  Administered 2020-12-06: 100 ug
  Filled 2020-12-06: qty 2

## 2020-12-06 MED ORDER — ALUM & MAG HYDROXIDE-SIMETH 200-200-20 MG/5ML PO SUSP: 30.0000 mL | ORAL | Status: DC | PRN

## 2020-12-06 MED ORDER — PROPOFOL 1000 MG/100ML IV EMUL
INTRAVENOUS | Status: AC
  Filled 2020-12-06: qty 100

## 2020-12-06 MED ORDER — STERILE WATER FOR IRRIGATION IR SOLN
Status: DC | PRN
  Administered 2020-12-06: 2000 mL

## 2020-12-06 MED ORDER — PHENOL 1.4 % MT LIQD: 1.0000 | OROMUCOSAL | Status: DC | PRN

## 2020-12-06 MED ORDER — TRANEXAMIC ACID-NACL 1000-0.7 MG/100ML-% IV SOLN
INTRAVENOUS | Status: AC
  Filled 2020-12-06: qty 100

## 2020-12-06 MED ORDER — METOCLOPRAMIDE HCL 5 MG PO TABS: 5.0000 mg | ORAL_TABLET | Freq: Three times a day (TID) | ORAL | Status: DC | PRN

## 2020-12-06 MED ORDER — LACTATED RINGERS IV SOLN: INTRAVENOUS | Status: DC

## 2020-12-06 MED ORDER — METHOCARBAMOL 1000 MG/10ML IJ SOLN
500.0000 mg | Freq: Four times a day (QID) | INTRAVENOUS | Status: DC | PRN
  Filled 2020-12-06: qty 5

## 2020-12-06 MED ORDER — ONDANSETRON HCL 4 MG/2ML IJ SOLN: 4.0000 mg | Freq: Four times a day (QID) | INTRAMUSCULAR | Status: DC | PRN

## 2020-12-06 MED ORDER — ORAL CARE MOUTH RINSE: 15.0000 mL | Freq: Once | OROMUCOSAL | Status: AC

## 2020-12-06 MED ORDER — OXYCODONE HCL 5 MG PO TABS: 10.0000 mg | ORAL_TABLET | ORAL | Status: DC | PRN

## 2020-12-06 MED ORDER — HYDROMORPHONE HCL 1 MG/ML IJ SOLN
0.2500 mg | INTRAMUSCULAR | Status: DC | PRN
  Administered 2020-12-06: 0.5 mg via INTRAVENOUS

## 2020-12-06 MED ORDER — PHENYLEPHRINE 40 MCG/ML (10ML) SYRINGE FOR IV PUSH (FOR BLOOD PRESSURE SUPPORT)
PREFILLED_SYRINGE | INTRAVENOUS | Status: AC
  Filled 2020-12-06: qty 10

## 2020-12-06 MED ORDER — POVIDONE-IODINE 10 % EX SWAB
2.0000 "application " | Freq: Once | CUTANEOUS | Status: AC
  Administered 2020-12-06: 2 via TOPICAL

## 2020-12-06 MED ORDER — OXYCODONE HCL 5 MG PO TABS
5.0000 mg | ORAL_TABLET | ORAL | Status: DC | PRN
  Administered 2020-12-06 – 2020-12-07 (×4): 10 mg via ORAL
  Administered 2020-12-07: 5 mg via ORAL
  Administered 2020-12-07: 10 mg via ORAL
  Filled 2020-12-06 (×6): qty 2

## 2020-12-06 MED ORDER — CHLORHEXIDINE GLUCONATE 0.12 % MT SOLN
15.0000 mL | Freq: Once | OROMUCOSAL | Status: AC
  Administered 2020-12-06: 15 mL via OROMUCOSAL

## 2020-12-06 MED ORDER — MENTHOL 3 MG MT LOZG: 1.0000 | LOZENGE | OROMUCOSAL | Status: DC | PRN

## 2020-12-06 MED ORDER — ACETAMINOPHEN 10 MG/ML IV SOLN
INTRAVENOUS | Status: DC | PRN
  Administered 2020-12-06: 1000 mg via INTRAVENOUS

## 2020-12-06 MED ORDER — DROPERIDOL 2.5 MG/ML IJ SOLN: 0.6250 mg | Freq: Once | INTRAMUSCULAR | Status: AC | PRN

## 2020-12-06 MED ORDER — PHENYLEPHRINE HCL (PRESSORS) 10 MG/ML IV SOLN
INTRAVENOUS | Status: AC
  Filled 2020-12-06: qty 1

## 2020-12-06 MED ORDER — ONDANSETRON HCL 4 MG/2ML IJ SOLN
4.0000 mg | Freq: Once | INTRAMUSCULAR | Status: AC | PRN
  Administered 2020-12-06: 4 mg via INTRAVENOUS

## 2020-12-06 MED ORDER — MIDAZOLAM HCL 5 MG/5ML IJ SOLN
INTRAMUSCULAR | Status: DC | PRN
  Administered 2020-12-06: 2 mg via INTRAVENOUS

## 2020-12-06 MED ORDER — OXYCODONE HCL 5 MG PO TABS: 5.0000 mg | ORAL_TABLET | Freq: Once | ORAL | Status: DC | PRN

## 2020-12-06 MED ORDER — ONDANSETRON HCL 4 MG/2ML IJ SOLN
INTRAMUSCULAR | Status: DC | PRN
  Administered 2020-12-06: 4 mg via INTRAVENOUS

## 2020-12-06 MED ORDER — TRANEXAMIC ACID-NACL 1000-0.7 MG/100ML-% IV SOLN
INTRAVENOUS | Status: DC | PRN
  Administered 2020-12-06: 1000 mg via INTRAVENOUS

## 2020-12-06 MED ORDER — ACETAMINOPHEN 325 MG PO TABS
325.0000 mg | ORAL_TABLET | Freq: Four times a day (QID) | ORAL | Status: DC | PRN
  Administered 2020-12-07: 650 mg via ORAL
  Filled 2020-12-06: qty 2

## 2020-12-06 SURGICAL SUPPLY — 56 items
BAG SPEC THK2 15X12 ZIP CLS (MISCELLANEOUS) ×1
BAG ZIPLOCK 12X15 (MISCELLANEOUS) ×2 IMPLANT
BENZOIN TINCTURE PRP APPL 2/3 (GAUZE/BANDAGES/DRESSINGS) IMPLANT
BLADE SAG 18X100X1.27 (BLADE) ×2 IMPLANT
BLADE SURG SZ10 CARB STEEL (BLADE) ×4 IMPLANT
BNDG ELASTIC 6X5.8 VLCR STR LF (GAUZE/BANDAGES/DRESSINGS) ×2 IMPLANT
BOWL SMART MIX CTS (DISPOSABLE) IMPLANT
BSPLAT TIB 3 KN TRITANIUM (Knees) ×1 IMPLANT
COOLER ICEMAN CLASSIC (MISCELLANEOUS) ×2 IMPLANT
COVER SURGICAL LIGHT HANDLE (MISCELLANEOUS) ×2 IMPLANT
COVER WAND RF STERILE (DRAPES) ×2 IMPLANT
CUFF TOURN SGL QUICK 34 (TOURNIQUET CUFF) ×2
CUFF TRNQT CYL 34X4.125X (TOURNIQUET CUFF) ×1 IMPLANT
DECANTER SPIKE VIAL GLASS SM (MISCELLANEOUS) IMPLANT
DRAPE U-SHAPE 47X51 STRL (DRAPES) ×2 IMPLANT
DRSG PAD ABDOMINAL 8X10 ST (GAUZE/BANDAGES/DRESSINGS) ×4 IMPLANT
DURAPREP 26ML APPLICATOR (WOUND CARE) ×2 IMPLANT
ELECT BLADE TIP CTD 4 INCH (ELECTRODE) ×2 IMPLANT
ELECT REM PT RETURN 15FT ADLT (MISCELLANEOUS) ×2 IMPLANT
FEMORAL POSTERIOR SZ3 RT (Joint) ×1 IMPLANT
GAUZE SPONGE 4X4 12PLY STRL (GAUZE/BANDAGES/DRESSINGS) ×2 IMPLANT
GAUZE XEROFORM 1X8 LF (GAUZE/BANDAGES/DRESSINGS) ×4 IMPLANT
GLOVE SRG 8 PF TXTR STRL LF DI (GLOVE) ×2 IMPLANT
GLOVE SURG ENC MOIS LTX SZ7.5 (GLOVE) ×2 IMPLANT
GLOVE SURG LTX SZ8 (GLOVE) ×2 IMPLANT
GLOVE SURG UNDER POLY LF SZ8 (GLOVE) ×4
GOWN STRL REUS W/TWL XL LVL3 (GOWN DISPOSABLE) ×4 IMPLANT
HANDPIECE INTERPULSE COAX TIP (DISPOSABLE) ×2
HOLDER FOLEY CATH W/STRAP (MISCELLANEOUS) IMPLANT
IMMOBILIZER KNEE 20 (SOFTGOODS) ×2
IMMOBILIZER KNEE 20 THIGH 36 (SOFTGOODS) ×1 IMPLANT
INSERT TIB TRIATH 12 S3 (Insert) ×2 IMPLANT
KIT TURNOVER KIT A (KITS) ×2 IMPLANT
KNEE PATELLA ASYMMETRIC 9X29 (Knees) ×2 IMPLANT
KNEE TIBIAL COMPONENT SZ3 (Knees) ×2 IMPLANT
NS IRRIG 1000ML POUR BTL (IV SOLUTION) ×2 IMPLANT
PACK TOTAL KNEE CUSTOM (KITS) ×2 IMPLANT
PAD COLD SHLDR WRAP-ON (PAD) ×2 IMPLANT
PADDING CAST COTTON 6X4 STRL (CAST SUPPLIES) ×2 IMPLANT
PENCIL SMOKE EVACUATOR (MISCELLANEOUS) ×2 IMPLANT
PIN FLUTED HEDLESS FIX 3.5X1/8 (PIN) ×2 IMPLANT
POSTERIOR FEMORAL SZ3 RT (Joint) ×2 IMPLANT
PROTECTOR NERVE ULNAR (MISCELLANEOUS) ×2 IMPLANT
SET HNDPC FAN SPRY TIP SCT (DISPOSABLE) ×1 IMPLANT
SET PAD KNEE POSITIONER (MISCELLANEOUS) ×2 IMPLANT
STAPLER VISISTAT 35W (STAPLE) ×2 IMPLANT
STRIP CLOSURE SKIN 1/2X4 (GAUZE/BANDAGES/DRESSINGS) IMPLANT
SUT MNCRL AB 4-0 PS2 18 (SUTURE) IMPLANT
SUT VIC AB 0 CT1 27 (SUTURE) ×2
SUT VIC AB 0 CT1 27XBRD ANTBC (SUTURE) ×1 IMPLANT
SUT VIC AB 1 CT1 36 (SUTURE) ×4 IMPLANT
SUT VIC AB 2-0 CT1 27 (SUTURE) ×4
SUT VIC AB 2-0 CT1 TAPERPNT 27 (SUTURE) ×2 IMPLANT
TRAY FOLEY MTR SLVR 16FR STAT (SET/KITS/TRAYS/PACK) IMPLANT
TRAY FOLEY SLVR 14FR TEMP STAT (SET/KITS/TRAYS/PACK) ×2 IMPLANT
WATER STERILE IRR 1000ML POUR (IV SOLUTION) ×4 IMPLANT

## 2020-12-06 NOTE — Brief Op Note (Signed)
12/06/2020  10:03 AM  PATIENT:  Jamie Hall  55 y.o. female  PRE-OPERATIVE DIAGNOSIS:  osteoarthritis with avascular necrosis right knee  POST-OPERATIVE DIAGNOSIS:  osteoarthritis with avascular necrosis right knee  PROCEDURE:  Procedure(s): RIGHT TOTAL KNEE ARTHROPLASTY (Right)  SURGEON:  Surgeon(s) and Role:    Mcarthur Rossetti, MD - Primary  PHYSICIAN ASSISTANT:  Benita Stabile, PA-C  ANESTHESIA:   local, regional and spinal  COUNTS:  YES  TOURNIQUET:   Total Tourniquet Time Documented: Thigh (Right) - 55 minutes Total: Thigh (Right) - 55 minutes   DICTATION: .Other Dictation: Dictation Number [id number]  PLAN OF CARE: Admit for overnight observation  PATIENT DISPOSITION:  PACU - hemodynamically stable.   Delay start of Pharmacological VTE agent (>24hrs) due to surgical blood loss or risk of bleeding: no

## 2020-12-06 NOTE — Plan of Care (Signed)
  Problem: Pain Management: Goal: Pain level will decrease with appropriate interventions Outcome: Progressing   Problem: Activity: Goal: Ability to avoid complications of mobility impairment will improve Outcome: Progressing Goal: Range of joint motion will improve Outcome: Progressing

## 2020-12-06 NOTE — Evaluation (Signed)
Physical Therapy Evaluation Patient Details Name: Jamie Hall MRN: [id number] DOB: September 05, 1965 Today's Date: 12/06/2020   History of Present Illness  Pt s/p R TKR and with hx of DM  Clinical Impression  Pt s/p R TKR and presents with decreased R LE strength/ROM and post op pain limiting functional mobility.  Pt should progress to dc home with family assist.    Follow Up Recommendations Home health PT;Follow surgeon's recommendation for DC plan and follow-up therapies    Equipment Recommendations  Rolling walker with 5" wheels    Recommendations for Other Services       Precautions / Restrictions Precautions Precautions: Fall Required Braces or Orthoses: Knee Immobilizer - Right Knee Immobilizer - Right: Discontinue once straight leg raise with < 10 degree lag Restrictions Weight Bearing Restrictions: No Other Position/Activity Restrictions: WBAT      Mobility  Bed Mobility Overal bed mobility: Needs Assistance Bed Mobility: Supine to Sit;Sit to Supine     Supine to sit: Min assist Sit to supine: Min assist   General bed mobility comments: Increased time with assist to manage R LE    Transfers                 General transfer comment: Deferred - pt feeling flushed in sitting and states "I think we're done here" after viewing surgical pictures with Dr Ninfa Linden  Ambulation/Gait                Stairs            Wheelchair Mobility    Modified Rankin (Stroke Patients Only)       Balance Overall balance assessment: Needs assistance Sitting-balance support: No upper extremity supported;Feet supported Sitting balance-Leahy Scale: Fair                                       Pertinent Vitals/Pain Pain Assessment: 0-10 Pain Score: 4  Pain Location: R knee` Pain Descriptors / Indicators: Aching;Sore Pain Intervention(s): Limited activity within patient's tolerance;Monitored during session;Premedicated before session     [place] expects to be discharged to:: Private residence Living Arrangements: Spouse/significant other;Children Available Help at Discharge: Family Type of Home: House Home Access: Stairs to enter Entrance Stairs-Rails: Right Entrance Stairs-Number of Steps: 7 Home Layout: One level Home Equipment: Crutches      Prior Function Level of Independence: Independent               Hand Dominance        Extremity/Trunk Assessment   Upper Extremity Assessment Upper Extremity Assessment: Overall WFL for tasks assessed    Lower Extremity Assessment Lower Extremity Assessment: RLE deficits/detail    Cervical / Trunk Assessment Cervical / Trunk Assessment: Normal  Communication   Communication: No difficulties  Cognition Arousal/Alertness: Awake/alert Behavior During Therapy: WFL for tasks assessed/performed Overall Cognitive Status: Within Functional Limits for tasks assessed                                        General Comments      Exercises Total Joint Exercises Ankle Circles/Pumps: AROM;Both;15 reps;Supine   Assessment/Plan    PT Assessment Patient needs continued PT services  PT Problem List Decreased strength;Decreased range of motion;Decreased activity tolerance;Decreased balance;Decreased mobility;Decreased knowledge of use of DME;Pain  PT Treatment Interventions DME instruction;Gait training;Stair training;Functional mobility training;Therapeutic activities;Therapeutic exercise;Patient/family education    PT Goals (Current goals can be found in the Care Plan section)  Acute Rehab PT Goals Patient Stated Goal: Regain IND PT Goal Formulation: With patient Time For Goal Achievement: 12/13/20 Potential to Achieve Goals: Good    Frequency 7X/week   Barriers to discharge        Co-evaluation               AM-PAC PT "6 Clicks" Mobility  Outcome Measure Help needed turning from your back to your  side while in a flat bed without using bedrails?: A Little Help needed moving from lying on your back to sitting on the side of a flat bed without using bedrails?: A Little Help needed moving to and from a bed to a chair (including a wheelchair)?: A Lot Help needed standing up from a chair using your arms (e.g., wheelchair or bedside chair)?: A Lot Help needed to walk in hospital room?: A Lot Help needed climbing 3-5 steps with a railing? : A Lot 6 Click Score: 14    End of Session Equipment Utilized During Treatment: Gait belt;Right knee immobilizer Activity Tolerance: Patient limited by fatigue Patient left: in bed;with call bell/phone within reach;with bed alarm set;with nursing/sitter in room Nurse Communication: Mobility status PT Visit Diagnosis: Difficulty in walking, not elsewhere classified (R26.2)    Time: 2878-6767 PT Time Calculation (min) (ACUTE ONLY): 20 min   Charges:   PT Evaluation $PT Eval Low Complexity: [place] Pager [phone] Office [phone]   Lindi Abram 12/06/2020, 4:15 PM

## 2020-12-06 NOTE — Plan of Care (Signed)
Discussed with patient about plan of care for post-op day 0. ° ° °Will continue to monitor patient.  ° ° °SWhittemore, RN ° °

## 2020-12-06 NOTE — Transfer of Care (Signed)
Immediate Anesthesia Transfer of Care Note  Patient: Jamie Hall  Procedure(s) Performed: RIGHT TOTAL KNEE ARTHROPLASTY (Right Knee)  Patient Location: PACU  Anesthesia Type:Spinal  Level of Consciousness: awake, alert , oriented and patient cooperative  Airway & Oxygen Therapy: Patient Spontanous Breathing and Patient connected to face mask oxygen  Post-op Assessment: Report given to RN and Post -op Vital signs reviewed and stable  Post vital signs: Reviewed and stable  Last Vitals:  Vitals Value Taken Time  BP 120/63 12/06/20 1030  Temp    Pulse 91 12/06/20 1030  Resp 15 12/06/20 1030  SpO2 92 % 12/06/20 1030  Vitals shown include unvalidated device data.  Last Pain:  Vitals:   12/06/20 0631  TempSrc: Oral  PainSc: 0-No pain      Patients Stated Pain Goal: 3 (84/66/59 [id number])  Complications: No complications documented.

## 2020-12-06 NOTE — Progress Notes (Signed)
AssistedDr. Houser with right, ultrasound guided, adductor canal block. Side rails up, monitors on throughout procedure. See vital signs in flow sheet. Tolerated Procedure well.  

## 2020-12-06 NOTE — Anesthesia Procedure Notes (Addendum)
Anesthesia Regional Block: Supraclavicular block   Pre-Anesthetic Checklist: ,, timeout performed, Correct Patient, Correct Site, Correct Laterality, Correct Procedure, Correct Position, site marked, Risks and benefits discussed,  Surgical consent,  Pre-op evaluation,  At surgeon's request and post-op pain management  Laterality: Right and Lower  Prep: chloraprep       Needles:  Injection technique: Single-shot  Needle Type: Echogenic Needle     Needle Length: 5cm  Needle Gauge: 21     Additional Needles:   Procedures:,,,, ultrasound used (permanent image in chart),,,,  Narrative:  Start time: 12/06/2020 7:34 AM End time: 12/06/2020 7:40 AM Injection made incrementally with aspirations every 5 mL.  Performed by: Personally  Anesthesiologist: Barnet Glasgow, MD

## 2020-12-06 NOTE — Anesthesia Procedure Notes (Signed)
Spinal  Patient location during procedure: OB Start time: 12/06/2020 8:25 AM End time: 12/06/2020 8:30 AM Reason for block: surgical anesthesia Staffing Performed: anesthesiologist  Anesthesiologist: Barnet Glasgow, MD Preanesthetic Checklist Completed: patient identified, IV checked, risks and benefits discussed, surgical consent, monitors and equipment checked, pre-op evaluation and timeout performed Spinal Block Patient position: sitting Prep: DuraPrep and site prepped and draped Patient monitoring: heart rate, cardiac monitor, continuous pulse ox and blood pressure Approach: midline Location: L3-4 Injection technique: single-shot Needle Needle type: Pencan  Needle gauge: 24 G Needle length: 10 cm Needle insertion depth: 7 cm Assessment Sensory level: T4 Events: CSF return Additional Notes 2 Attempt (s). Pt tolerated procedure well.

## 2020-12-06 NOTE — Anesthesia Postprocedure Evaluation (Signed)
Anesthesia Post Note  Patient: Jamie Hall  Procedure(s) Performed: RIGHT TOTAL KNEE ARTHROPLASTY (Right Knee)     Patient location during evaluation: Nursing Unit Anesthesia Type: Spinal Level of consciousness: oriented and awake and alert Pain management: pain level controlled Vital Signs Assessment: post-procedure vital signs reviewed and stable Respiratory status: spontaneous breathing and respiratory function stable Cardiovascular status: blood pressure returned to baseline and stable Postop Assessment: no headache, no backache, no apparent nausea or vomiting and patient able to bend at knees Anesthetic complications: no   No complications documented.  Last Vitals:  Vitals:   12/06/20 1115 12/06/20 1130  BP: 116/68 (!) 101/37  Pulse: 85 82  Resp: (P) 20 20  Temp:    SpO2: 97% 96%    Last Pain:  Vitals:   12/06/20 1115  TempSrc:   PainSc: 0-No pain                 Barnet Glasgow

## 2020-12-06 NOTE — Interval H&P Note (Signed)
History and Physical Interval Note: The patient understands fully that she is here today for a right total knee arthroplasty to treat her right knee osteoarthritis.  There has been no acute or interval change in her medical status.  See recent H&P.  The risks and benefits of surgery been explained in detail and informed consent was obtained.  The right knee has been marked.  12/06/2020 7:06 AM  Jamie Hall  has presented today for surgery, with the diagnosis of osteoarthritis with avascular necrosis right knee.  The various methods of treatment have been discussed with the patient and family. After consideration of risks, benefits and other options for treatment, the patient has consented to  Procedure(s): RIGHT TOTAL KNEE ARTHROPLASTY (Right) as a surgical intervention.  The patient's history has been reviewed, patient examined, no change in status, stable for surgery.  I have reviewed the patient's chart and labs.  Questions were answered to the patient's satisfaction.     Mcarthur Rossetti

## 2020-12-07 ENCOUNTER — Other Ambulatory Visit (HOSPITAL_COMMUNITY): Payer: Self-pay

## 2020-12-07 DIAGNOSIS — Z79899 Other long term (current) drug therapy: Secondary | ICD-10-CM | POA: Diagnosis not present

## 2020-12-07 DIAGNOSIS — I10 Essential (primary) hypertension: Secondary | ICD-10-CM | POA: Diagnosis not present

## 2020-12-07 DIAGNOSIS — R531 Weakness: Secondary | ICD-10-CM | POA: Diagnosis not present

## 2020-12-07 DIAGNOSIS — Z7984 Long term (current) use of oral hypoglycemic drugs: Secondary | ICD-10-CM | POA: Diagnosis not present

## 2020-12-07 DIAGNOSIS — M1711 Unilateral primary osteoarthritis, right knee: Secondary | ICD-10-CM | POA: Diagnosis not present

## 2020-12-07 DIAGNOSIS — Z85828 Personal history of other malignant neoplasm of skin: Secondary | ICD-10-CM | POA: Diagnosis not present

## 2020-12-07 DIAGNOSIS — E119 Type 2 diabetes mellitus without complications: Secondary | ICD-10-CM | POA: Diagnosis not present

## 2020-12-07 LAB — CBC
HCT: 34.2 % — ABNORMAL LOW (ref 36.0–46.0)
Hemoglobin: 10.7 g/dL — ABNORMAL LOW (ref 12.0–15.0)
MCH: 28.2 pg (ref 26.0–34.0)
MCHC: 31.3 g/dL (ref 30.0–36.0)
MCV: 90 fL (ref 80.0–100.0)
Platelets: 391 10*3/uL (ref 150–400)
RBC: 3.8 MIL/uL — ABNORMAL LOW (ref 3.87–5.11)
RDW: 14.2 % (ref 11.5–15.5)
WBC: 21.4 10*3/uL — ABNORMAL HIGH (ref 4.0–10.5)
nRBC: 0 % (ref 0.0–0.2)

## 2020-12-07 LAB — BASIC METABOLIC PANEL
Anion gap: 9 (ref 5–15)
BUN: 13 mg/dL (ref 6–20)
CO2: 28 mmol/L (ref 22–32)
Calcium: 8.7 mg/dL — ABNORMAL LOW (ref 8.9–10.3)
Chloride: 102 mmol/L (ref 98–111)
Creatinine, Ser: 0.75 mg/dL (ref 0.44–1.00)
GFR, Estimated: >60 mL/min (ref >60)
Glucose, Bld: 138 mg/dL — ABNORMAL HIGH (ref 70–99)
Potassium: 3.5 mmol/L (ref 3.5–5.1)
Sodium: 139 mmol/L (ref 135–145)

## 2020-12-07 MED ORDER — ASPIRIN 81 MG PO CHEW
81.0000 mg | CHEWABLE_TABLET | Freq: Two times a day (BID) | ORAL | 0 refills | Status: DC
  Filled 2020-12-07: qty 60, 30d supply, fill #0

## 2020-12-07 MED ORDER — METHOCARBAMOL 500 MG PO TABS
500.0000 mg | ORAL_TABLET | Freq: Four times a day (QID) | ORAL | 1 refills | Status: DC | PRN
  Filled 2020-12-07: qty 40, 10d supply, fill #0

## 2020-12-07 MED ORDER — HYDROMORPHONE HCL 2 MG PO TABS
2.0000 mg | ORAL_TABLET | ORAL | 0 refills | Status: DC | PRN
  Filled 2020-12-07: qty 30, 5d supply, fill #0

## 2020-12-07 NOTE — Op Note (Signed)
Jamie Hall, [place] MEDICAL RECORD NO: [id number] ACCOUNT NO: [id number] DATE OF BIRTH: 01-25-1966 FACILITY: [facility]ATION: [facility] PHYSICIAN: Lind Guest. Ninfa Linden, MD  Operative Report   DATE OF PROCEDURE: 12/06/2020  PREOPERATIVE DIAGNOSIS:  Primary osteoarthritis and degenerative joint disease, right knee.  POSTOPERATIVE DIAGNOSIS:  Primary osteoarthritis and degenerative joint disease, right knee.  PROCEDURE:  Right total knee arthroplasty.  IMPLANTS:  Stryker Triathlon press fit knee system with size 3 femur, size 3 tibial tray, 12 mm fixed bearing polyethylene insert, size 29 patellar button.  SURGEON:  Jean Rosenthal, M.D.  ASSISTANT:  Erskine Emery, PA-C.  ANESTHESIA:   1.  Right lower extremity adductor canal block. 2.  Spinal. 3.  Local with 0.25% Marcaine with epinephrine around the arthrotomy.  Antibiotics 1 gram of vancomycin.  TOURNIQUET TIME:  Less than 1 hour.  BLOOD LOSS:  474 mL  COMPLICATIONS:  None.  INDICATIONS:  The patient is well known to me.  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  She has unfortunately been dealing with debilitating knee pain for some time after carrying the ligament tear of her knee  remotely, which I believe may have been skiing.  Earlier this year we took her to the operating room for an acute meniscal tear of her right knee.  This was the medial meniscus.  We found significant cartilage loss in the medial femoral condyle and  medial tibial plateau.  There were some remnants and ACL and some patellofemoral disease.  She ended up having severe pain in that knee after surgery.  An MRI was obtained showing significant subchondral edema in the medial femoral condyle and with  continued pain.  At this point, we have recommended potentially partial knee arthroplasty.  However, she has an ACL deficient knee and accepted a total knee arthroplasty.  She even tried conservative treatment with  medial compartment offloading  osteoarthritis brace.  With continued pain in the knee at this point, we have recommended a total knee replacement on the right knee.  At this point, her knee pain is detrimentally affecting her mobility, quality of life and activities of daily living to  the point of proceeding with surgery.  We did talk about the risk of acute blood loss anemia, nerve or vessel injury, fracture, infection, DVT, and implant failure.  We talked about our goals being decreased pain, improve mobility and overall improve  quality of life.  DESCRIPTION OF PROCEDURE:  After informed consent was obtained, appropriate right knee was marked.  An adductor canal block was obtained in the right lower extremity, holding room.  She was then brought to the operating room and sat up on the operating  table.  Spinal anesthesia was obtained, a nonsterile tourniquet was placed around her upper right thigh and her right thigh, knee, leg, and ankle were prepped and draped with DuraPrep and sterile drapes including a sterile stockinette.  A timeout was  called to identify correct patient, correct right knee.  We then used Esmarch to wrap that leg and tourniquet was inflated to 300 mm of pressure.  We then made a direct midline incision over the patella and carried this proximally and distally.  We  dissected down the knee joint, carried out a medial parapatellar arthrotomy, finding very large joint effusion.  Once we got the knee open there was significant cartilage wear at the medial compartment of the knee as well as underneath the patella  itself.  We removed remnants  of the ACL, PCL, medial and lateral meniscus.  With the extramedullary cutting guide for tibia, we set this for taking 9 mm off the high side, correcting varus and valgus in the neutral slope.  We made this cut without  difficulty.  We then went to the femur and using intramedullary guide for making our distal femoral cutting guide this was set  for a right knee at 5 degrees externally rotated for an 8 mm distal femoral cut.  We made this cut without difficulty.  Of  note, we did find that she had nice and hard strong bone and she is young 55 year old female with no osteoporosis.  We then brought the knee back down to full extension with a 9 mm extension block, she actually slightly hyperextended.  We made sure we  removed all remnants of the meniscus from the back of the knee.  We then went back to the femur and put our femoral sizing guide based off the epicondylar axis.  We chose a size 3 femur based off of this and put a 4-in-1 cutting block for a size 3 femur,  made our anterior and posterior cuts, followed by our chamfer cuts.  We then made our femoral box cut.  Attention was then turned back to the tibia, we chose a size 3 tibial tray for coverage, setting the rotation of the tibial tubercle and the femur  and we did our keel punch off of this and will do this for a press-fit system giving good quality of bone.  With a size 3 trial tibia, we trialed a 3 right femur and a 9 mm fixed bearing polyethylene insert.  Right away, we felt that we would need  thicker inserts.  We then made our patellar cut and drilled three holes for a size 29 patellar button.  We then removed all instrumentation for the knee and irrigated the knee with normal saline.  We placed our Marcaine with epinephrine around the  arthrotomy.  We then dried the knee real well and with the knee in a flexed position placed our real Stryker Triathlon press-fit tibial component, size 3, followed by real size 3 press-fit right femur.  We placed a 12 mm real fixed bearing polyethylene  insert and press fit our size 29 patellar button.  We then put the knee through several cycles of motion.  We were pleased with range of motion and stability and rotation.  We then let the tourniquet down.  Hemostasis obtained with electrocautery.  We  closed the arthrotomy with interrupted #1 Vicryl  suture followed by 0 Vicryl to close deep tissue and 2-0 Vicryl to close the subcutaneous tissue.  The skin was closed with staples.  A well-padded sterile dressing was applied.  She was taken to recovery  room in stable condition.  All final counts were correct.  No complications noted.  Of note, Benita Stabile, PA-C assisted during the entire case and assistance was crucial for facilitating every aspect of this case.   Elián.Darby D: 12/06/2020 10:02:04 am T: 12/07/2020 2:58:00 am  JOB: [id number]/ [id number]

## 2020-12-07 NOTE — Progress Notes (Signed)
Physical Therapy Treatment Patient Details Name: Jamie Hall MRN: [id number] DOB: March 02, 1966 Today's Date: 12/07/2020    History of Present Illness Pt s/p R TKR and with hx of DM    PT Comments    Marked improvement in activity tolerance with pt progressing with mobility and hopeful for dc home this date.   Follow Up Recommendations  Home health PT;Follow surgeon's recommendation for DC plan and follow-up therapies     Equipment Recommendations  Rolling walker with 5" wheels    Recommendations for Other Services       Precautions / Restrictions Precautions Precautions: Fall Required Braces or Orthoses: Knee Immobilizer - Right Knee Immobilizer - Right: Discontinue once straight leg raise with < 10 degree lag Restrictions Weight Bearing Restrictions: No RLE Weight Bearing: Weight bearing as tolerated    Mobility  Bed Mobility Overal bed mobility: Needs Assistance Bed Mobility: Supine to Sit     Supine to sit: Min guard     General bed mobility comments: Pt self assisting R LE with UEs    Transfers Overall transfer level: Needs assistance Equipment used: Rolling walker (2 wheeled) Transfers: Sit to/from Stand Sit to Stand: Min guard         General transfer comment: cues for LE management and use of UEs to self assist  Ambulation/Gait Ambulation/Gait assistance: Min assist;Min guard Gait Distance (Feet): 75 Feet Assistive device: Rolling walker (2 wheeled) Gait Pattern/deviations: Step-to pattern Gait velocity: decr   General Gait Details: cues for sequence, posture and position from [employer]             Wheelchair Mobility    Modified Rankin (Stroke Patients Only)       Balance Overall balance assessment: Needs assistance Sitting-balance support: No upper extremity supported;Feet supported Sitting balance-Leahy Scale: Good     Standing balance support: Bilateral upper extremity supported Standing balance-Leahy Scale: Poor                               Cognition Arousal/Alertness: Awake/alert Behavior During Therapy: WFL for tasks assessed/performed Overall Cognitive Status: Within Functional Limits for tasks assessed                                        Exercises Total Joint Exercises Ankle Circles/Pumps: AROM;Both;15 reps;Supine Quad Sets: AROM;Both;10 reps;Supine Heel Slides: AAROM;Right;15 reps;Supine Straight Leg Raises: AAROM;Right;10 reps;Supine Goniometric ROM: AAROM R knee -5 - 45    General Comments        Pertinent Vitals/Pain Pain Assessment: 0-10 Pain Score: 6  Pain Location: R knee` Pain Descriptors / Indicators: Aching;Sore Pain Intervention(s): Limited activity within patient's tolerance;Monitored during session;Premedicated before session;Ice applied    Home Living                      Prior Function            PT Goals (current goals can now be found in the care plan section) Acute Rehab PT Goals Patient Stated Goal: Regain IND PT Goal Formulation: With patient Time For Goal Achievement: 12/13/20 Potential to Achieve Goals: Good Progress towards PT goals: Progressing toward goals    Frequency    7X/week      PT Plan Current plan remains appropriate    Co-evaluation  AM-PAC PT "6 Clicks" Mobility   Outcome Measure  Help needed turning from your back to your side while in a flat bed without using bedrails?: A Little Help needed moving from lying on your back to sitting on the side of a flat bed without using bedrails?: A Little Help needed moving to and from a bed to a chair (including a wheelchair)?: A Little Help needed standing up from a chair using your arms (e.g., wheelchair or bedside chair)?: A Little Help needed to walk in hospital room?: A Little Help needed climbing 3-5 steps with a railing? : A Little 6 Click Score: 18    End of Session Equipment Utilized During Treatment: Gait belt;Right  knee immobilizer Activity Tolerance: Patient tolerated treatment well Patient left: with call bell/phone within reach;in chair;with chair alarm set Nurse Communication: Mobility status PT Visit Diagnosis: Difficulty in walking, not elsewhere classified (R26.2)     Time: 9675-9163 PT Time Calculation (min) (ACUTE ONLY): 30 min  Charges:  $Gait Training: 8-22 mins $Therapeutic Exercise: 8-22 mins                     [place] Pager [phone] Office [phone]    Joselyn Edling 12/07/2020, 1:06 PM

## 2020-12-07 NOTE — Plan of Care (Signed)
Pt stable at time of discharge instructions and education. Family at bedside. Dressing, dry, and intact.

## 2020-12-07 NOTE — Progress Notes (Signed)
Subjective: 1 Day Post-Op Procedure(s) (LRB): RIGHT TOTAL KNEE ARTHROPLASTY (Right) Patient reports pain as moderate.    Objective: Vital signs in last 24 hours: Temp:  [97.7 F (36.5 C)-98.7 F (37.1 C)] 98.4 F (36.9 C) (05/14 1043) Pulse Rate:  [60-91] 68 (05/14 1043) Resp:  [7-20] 16 (05/14 1043) BP: (111-151)/(64-86) 144/65 (05/14 1043) SpO2:  [93 %-98 %] 95 % (05/14 1043)  Intake/Output from previous day: 05/13 0701 - 05/14 0700 In: 3495 [P.O.:420; I.V.:2275; IV Piggyback:800] Out: 1800 [Urine:1700; Blood:100] Intake/Output this shift: Total I/O In: 800 [P.O.:800] Out: -   Recent Labs    12/07/20 0323  HGB 10.7*   Recent Labs    12/07/20 0323  WBC 21.4*  RBC 3.80*  HCT 34.2*  PLT 391   Recent Labs    12/07/20 0323  NA 139  K 3.5  CL 102  CO2 28  BUN 13  CREATININE 0.75  GLUCOSE 138*  CALCIUM 8.7*   No results for input(s): LABPT, INR in the last 72 hours.  Sensation intact distally Intact pulses distally Dorsiflexion/Plantar flexion intact Incision: dressing C/D/I No cellulitis present Compartment soft   Assessment/Plan: 1 Day Post-Op Procedure(s) (LRB): RIGHT TOTAL KNEE ARTHROPLASTY (Right) Up with therapy Discharge home with home health    Patient's anticipated LOS is less than 2 midnights, meeting these requirements: - Younger than 64 - Lives within 1 hour of care - Has a competent adult at home to recover with post-op recover - NO history of  - Chronic pain requiring opiods  - Diabetes  - Coronary Artery Disease  - Heart failure  - Heart attack  - Stroke  - DVT/VTE  - Cardiac arrhythmia  - Respiratory Failure/COPD  - Renal failure  - Anemia  - Advanced Liver disease       Mcarthur Rossetti 12/07/2020, 11:37 AM

## 2020-12-07 NOTE — TOC Initial Note (Signed)
Transition of Care [facility]) - Initial/Assessment Note    Patient Details  Name: Jamie Hall MRN: [id number] Date of Birth: 02-Sep-1965  [facility] CM/SW Contact:    Leeroy Cha, RN Phone Number: 12/07/2020, 10:39 AM  Clinical Narrative:                 hhc will be done through [facility] dme ordered through [employer].    Barriers to Discharge: No Barriers Identified   Patient Goals and CMS Choice        Expected Discharge Plan and Services     Discharge Planning Services: CM Consult Post Acute Care Choice: Durable Medical Equipment Living arrangements for the past 2 months: Single Family Home                 DME Arranged: 3-N-1,Walker rolling DME Agency: [employer] Date DME Agency Contacted: 12/07/20 Time DME Agency Contacted: 1034 Representative spoke with at DME Agency: caroline            Prior Living Arrangements/Services Living arrangements for the past 2 months: [place] with:: Spouse                   Activities of Daily Living Home Assistive Devices/Equipment: Crutches,Eyeglasses,Built-in shower seat ADL Screening (condition at time of admission) Patient's cognitive ability adequate to safely complete daily activities?: Yes Is the patient deaf or have difficulty hearing?: No Does the patient have difficulty seeing, even when wearing glasses/contacts?: No Does the patient have difficulty concentrating, remembering, or making decisions?: No Patient able to express need for assistance with ADLs?: Yes Does the patient have difficulty dressing or bathing?: No Independently performs ADLs?: Yes (appropriate for developmental age) Does the patient have difficulty walking or climbing stairs?: No Weakness of Legs: None Weakness of Arms/Hands: None  Permission Sought/Granted                  Emotional Assessment              Admission diagnosis:  Status post total right knee replacement [Z96.651] Patient  Active Problem List   Diagnosis Date Noted  . Status post total right knee replacement 12/06/2020  . Arthritis of right knee 12/05/2020  . Acute tear medial meniscus, right, subsequent encounter 08/16/2020  . Hyperlipidemia associated with type 2 diabetes mellitus ([place]) 08/18/2019  . Physical exam 02/03/2019  . Diabetes mellitus without complication ([place]) 70/07/7492  . Morbid obesity ([place]) 01/17/2018  . HTN (hypertension) 01/17/2018  . Cluster headaches 01/17/2018  . Basal cell carcinoma of skin 02/18/2012   PCP:  Midge Minium, MD Pharmacy:   [facility] [place] [address] Phone: [phone] Fax: [phone]  [id number] - [place], [place] - [address] AT [facility] [address] [place] [address] Phone: [phone] Fax: [phone]     Social Determinants of Health (SDOH) Interventions    Readmission Risk Interventions No flowsheet data found.

## 2020-12-07 NOTE — Discharge Summary (Signed)
Patient ID: HALONA AMSTUTZ MRN: [id number] DOB/AGE: Mar 09, 1966 55 y.o.  Admit date: 12/06/2020 Discharge date: 12/07/2020  Admission Diagnoses:  Principal Problem:   Arthritis of right knee Active Problems:   Status post total right knee replacement   Discharge Diagnoses:  Same  Past Medical History:  Diagnosis Date  . DM type 2 (diabetes mellitus, type 2) ([place])    followed by pcp  . GERD (gastroesophageal reflux disease)   . Headache   . History of kidney stones   . Hypertension   . OA (osteoarthritis)   . PONV (postoperative nausea and vomiting)   . Right knee meniscal tear     Surgeries: Procedure(s): RIGHT TOTAL KNEE ARTHROPLASTY on 12/06/2020   Consultants:   Discharged Condition: Improved  Hospital Course: WRENN WILLCOX is an 55 y.o. female who was admitted 12/06/2020 for operative treatment ofArthritis of right knee. Patient has severe unremitting pain that affects sleep, daily activities, and work/hobbies. After pre-op clearance the patient was taken to the operating room on 12/06/2020 and underwent  Procedure(s): RIGHT TOTAL KNEE ARTHROPLASTY.    Patient was given perioperative antibiotics:  Anti-infectives (From admission, onward)   Start     Dose/Rate Route Frequency Ordered Stop   12/06/20 2000  vancomycin (VANCOREADY) IVPB 1000 mg/200 mL        1,000 mg 200 mL/hr over 60 Minutes Intravenous Every 12 hours 12/06/20 1341 12/06/20 2231   12/06/20 0600  vancomycin (VANCOREADY) IVPB 1500 mg/300 mL        1,500 mg 150 mL/hr over 120 Minutes Intravenous On call to O.R. 12/05/20 6237 12/06/20 0931       Patient was given sequential compression devices, early ambulation, and chemoprophylaxis to prevent DVT.  Patient benefited maximally from hospital stay and there were no complications.    Recent vital signs:  Patient Vitals for the past 24 hrs:  BP Temp Temp src Pulse Resp SpO2  12/07/20 1043 (!) 144/65 98.4 F (36.9 C) -- 68 16 95 %  12/07/20 0622 (!)  150/69 98.7 F (37.1 C) -- 91 16 95 %  12/07/20 0212 111/64 98.5 F (36.9 C) -- 77 16 98 %  12/06/20 1936 140/76 98 F (36.7 C) Oral 67 16 97 %  12/06/20 1529 (!) 141/66 97.7 F (36.5 C) -- 64 18 93 %  12/06/20 1336 (!) 143/85 -- -- 66 14 98 %  12/06/20 1315 (!) 151/86 -- -- 66 12 96 %  12/06/20 1300 (!) 149/83 97.8 F (36.6 C) -- 60 (!) 7 96 %  12/06/20 1245 139/80 -- -- 80 20 97 %  12/06/20 1230 (!) 147/77 -- -- 80 16 96 %  12/06/20 1215 138/74 97.8 F (36.6 C) -- 74 18 97 %  12/06/20 1200 128/74 -- -- 63 20 96 %  12/06/20 1145 136/68 -- -- 64 12 96 %     Recent laboratory studies:  Recent Labs    12/07/20 0323  WBC 21.4*  HGB 10.7*  HCT 34.2*  PLT 391  NA 139  K 3.5  CL 102  CO2 28  BUN 13  CREATININE 0.75  GLUCOSE 138*  CALCIUM 8.7*     Discharge Medications:   Allergies as of 12/07/2020      Reactions   Bee Venom    Other reaction(s): EDEMA   Benzoyl Peroxide Other (See Comments)   Makes eyes shut   Ceftin [cefuroxime] Swelling   Throat swelling   Clindamycin/lincomycin Hives   Codeine Hives  Dilaudid [hydromorphone] Hives   Hydrocodone    itching   Oxycodone-acetaminophen Hives   Penicillins Hives   Tape Other (See Comments)   Blisters all adhesives likes paper tape      Medication List    TAKE these medications   aspirin 81 MG chewable tablet Chew 1 tablet (81 mg total) by mouth 2 (two) times daily.   atorvastatin 10 MG tablet Commonly known as: LIPITOR TAKE 1 TABLET BY MOUTH AT BEDTIME What changed: how much to take   HYDROmorphone 2 MG tablet Commonly known as: Dilaudid Take 1 tablet (2 mg total) by mouth every 4 (four) hours as needed for severe pain.   levonorgestrel 20 MCG/24HR IUD Commonly known as: MIRENA 1 each by Intrauterine route once.   lisinopril 10 MG tablet Commonly known as: ZESTRIL TAKE 1 TABLET BY MOUTH ONCE DAILY What changed: how much to take   metFORMIN 500 MG tablet Commonly known as: GLUCOPHAGE TAKE 1  TABLET BY MOUTH TWO TIMES DAILY WITH MEALS What changed: how much to take   methocarbamol 500 MG tablet Commonly known as: ROBAXIN Take 1 tablet (500 mg total) by mouth every 6 (six) hours as needed for muscle spasms.   omeprazole 20 MG capsule Commonly known as: PRILOSEC TAKE 1 CAPSULE BY MOUTH ONCE DAILY What changed:   how much to take  when to take this   Sutab 1479-225-188 MG Tabs Generic drug: Sodium Sulfate-Mag Sulfate-KCl Use as directed for colonoscopy. MANUFACTURER CODES!! BIN: [id number] PCN: CN GROUP: [id number] MEMBER ID: [id number];[employer] AS SECONDARY INSURANCE ;NO PRIOR AUTHORIZATION   Vitamin D3 125 MCG (5000 UT) Caps Take 5,000 Units by mouth daily.            Durable Medical Equipment  (From admission, onward)         Start     Ordered   12/06/20 1342  DME 3 n 1  Once        12/06/20 1341   12/06/20 1342  DME Walker rolling  Once       Question Answer Comment  Walker: With 5 Inch Wheels   Patient needs a walker to treat with the following condition Status post total right knee replacement      12/06/20 1341          Diagnostic Studies: DG Knee Right Port  Result Date: 12/06/2020 CLINICAL DATA:  RIGHT total knee arthroplasty EXAM: PORTABLE RIGHT KNEE - 1-2 VIEW COMPARISON:  Portable exam 1054 hours compared to 11/27/2020 FINDINGS: Components of RIGHT knee prosthesis identified. No acute fracture, dislocation, or bone destruction. Expected anterior soft tissue changes related to surgery. IMPRESSION: RIGHT knee prosthesis without acute abnormality. Electronically Signed   By: Lavonia Dana M.D.   On: 12/06/2020 11:36    Disposition: Discharge disposition: 01-Home or [place]    Mcarthur Rossetti, MD Follow up in 2 week(s).   Specialty: Orthopedic Surgery Contact information: [address] [place] [address] [phone]                Signed: Mcarthur Rossetti 12/07/2020, 11:39  AM

## 2020-12-07 NOTE — Plan of Care (Signed)
  Problem: Pain Management: Goal: Pain level will decrease with appropriate interventions Outcome: Progressing   Problem: Skin Integrity: Goal: Will show signs of wound healing Outcome: Progressing   Problem: Activity: Goal: Ability to avoid complications of mobility impairment will improve Outcome: Progressing   

## 2020-12-07 NOTE — Progress Notes (Signed)
Physical Therapy Treatment Patient Details Name: Jamie Hall MRN: [id number] DOB: Jun 10, 1966 Today's Date: 12/07/2020    History of Present Illness Pt s/p R TKR and with hx of DM    PT Comments    Pt limited by nausea, fatigue and on going pain but up to ambulate limited distance in hall and negotiate stairs.  Pt reviewed don/doff KI and written HEP.  In discussion with spouse pt expresses desire to dc home this date and follow up with HHPT.  Follow Up Recommendations  Home health PT;Follow surgeon's recommendation for DC plan and follow-up therapies     Equipment Recommendations  Rolling walker with 5" wheels    Recommendations for Other Services       Precautions / Restrictions Precautions Precautions: Fall Required Braces or Orthoses: Knee Immobilizer - Right Knee Immobilizer - Right: Discontinue once straight leg raise with < 10 degree lag Restrictions Weight Bearing Restrictions: No RLE Weight Bearing: Weight bearing as tolerated    Mobility  Bed Mobility               General bed mobility comments: Pt up in chair and returned to same    Transfers Overall transfer level: Needs assistance Equipment used: Rolling walker (2 wheeled) Transfers: Sit to/from Stand Sit to Stand: Min guard         General transfer comment: cues for LE management and use of UEs to self assist  Ambulation/Gait Ambulation/Gait assistance: Min guard Gait Distance (Feet): 54 Feet Assistive device: Rolling walker (2 wheeled) Gait Pattern/deviations: Step-to pattern Gait velocity: decr   General Gait Details: min cues for sequence, posture and position from RW   Stairs Stairs: Yes Stairs assistance: Min assist Stair Management: One rail Right;Step to pattern;Forwards;With crutches Number of Stairs: 5 General stair comments: up two and down three - cues for sequence and foot/crutch placement   Wheelchair Mobility    Modified Rankin (Stroke Patients Only)        Balance Overall balance assessment: Needs assistance Sitting-balance support: No upper extremity supported;Feet supported Sitting balance-Leahy Scale: Good     Standing balance support: Bilateral upper extremity supported Standing balance-Leahy Scale: Fair Standing balance comment: able to don mask in standing using both hands                            Cognition Arousal/Alertness: Awake/alert Behavior During Therapy: WFL for tasks assessed/performed Overall Cognitive Status: Within Functional Limits for tasks assessed                                        Exercises      General Comments        Pertinent Vitals/Pain Pain Assessment: 0-10 Pain Score: 6  Pain Location: R knee and thigh Pain Descriptors / Indicators: Aching;Sore Pain Intervention(s): Limited activity within patient's tolerance;Monitored during session;Premedicated before session    Home Living                      Prior Function            PT Goals (current goals can now be found in the care plan section) Acute Rehab PT Goals Patient Stated Goal: Regain IND PT Goal Formulation: With patient Time For Goal Achievement: 12/13/20 Potential to Achieve Goals: Good Progress towards PT goals: Progressing toward goals  Frequency    7X/week      PT Plan Current plan remains appropriate    Co-evaluation              AM-PAC PT "6 Clicks" Mobility   Outcome Measure  Help needed turning from your back to your side while in a flat bed without using bedrails?: A Little Help needed moving from lying on your back to sitting on the side of a flat bed without using bedrails?: A Little Help needed moving to and from a bed to a chair (including a wheelchair)?: A Little Help needed standing up from a chair using your arms (e.g., wheelchair or bedside chair)?: A Little Help needed to walk in hospital room?: A Little Help needed climbing 3-5 steps with a railing? : A  Little 6 Click Score: 18    End of Session Equipment Utilized During Treatment: Gait belt;Right knee immobilizer Activity Tolerance: Patient limited by fatigue;Patient limited by pain Patient left: in chair;with call bell/phone within reach;with family/visitor present Nurse Communication: Mobility status PT Visit Diagnosis: Difficulty in walking, not elsewhere classified (R26.2)     Time: 0092-3300 PT Time Calculation (min) (ACUTE ONLY): 17 min  Charges:  $Gait Training: 8-22 mins                     [place] Pager [phone] Office [phone]    Selassie Spatafore 12/07/2020, 4:00 PM

## 2020-12-07 NOTE — Discharge Instructions (Signed)

## 2020-12-08 DIAGNOSIS — G44009 Cluster headache syndrome, unspecified, not intractable: Secondary | ICD-10-CM | POA: Diagnosis not present

## 2020-12-08 DIAGNOSIS — I10 Essential (primary) hypertension: Secondary | ICD-10-CM | POA: Diagnosis not present

## 2020-12-08 DIAGNOSIS — Z6841 Body Mass Index (BMI) 40.0 and over, adult: Secondary | ICD-10-CM | POA: Diagnosis not present

## 2020-12-08 DIAGNOSIS — Z471 Aftercare following joint replacement surgery: Secondary | ICD-10-CM | POA: Diagnosis not present

## 2020-12-08 DIAGNOSIS — E785 Hyperlipidemia, unspecified: Secondary | ICD-10-CM | POA: Diagnosis not present

## 2020-12-08 DIAGNOSIS — Z7984 Long term (current) use of oral hypoglycemic drugs: Secondary | ICD-10-CM | POA: Diagnosis not present

## 2020-12-08 DIAGNOSIS — E1169 Type 2 diabetes mellitus with other specified complication: Secondary | ICD-10-CM | POA: Diagnosis not present

## 2020-12-08 DIAGNOSIS — K219 Gastro-esophageal reflux disease without esophagitis: Secondary | ICD-10-CM | POA: Diagnosis not present

## 2020-12-09 ENCOUNTER — Encounter (HOSPITAL_COMMUNITY): Payer: Self-pay | Admitting: Orthopaedic Surgery

## 2020-12-11 DIAGNOSIS — K219 Gastro-esophageal reflux disease without esophagitis: Secondary | ICD-10-CM | POA: Diagnosis not present

## 2020-12-11 DIAGNOSIS — E785 Hyperlipidemia, unspecified: Secondary | ICD-10-CM | POA: Diagnosis not present

## 2020-12-11 DIAGNOSIS — Z471 Aftercare following joint replacement surgery: Secondary | ICD-10-CM | POA: Diagnosis not present

## 2020-12-11 DIAGNOSIS — I10 Essential (primary) hypertension: Secondary | ICD-10-CM | POA: Diagnosis not present

## 2020-12-11 DIAGNOSIS — Z7984 Long term (current) use of oral hypoglycemic drugs: Secondary | ICD-10-CM | POA: Diagnosis not present

## 2020-12-11 DIAGNOSIS — Z6841 Body Mass Index (BMI) 40.0 and over, adult: Secondary | ICD-10-CM | POA: Diagnosis not present

## 2020-12-11 DIAGNOSIS — E1169 Type 2 diabetes mellitus with other specified complication: Secondary | ICD-10-CM | POA: Diagnosis not present

## 2020-12-11 DIAGNOSIS — G44009 Cluster headache syndrome, unspecified, not intractable: Secondary | ICD-10-CM | POA: Diagnosis not present

## 2020-12-13 ENCOUNTER — Telehealth: Payer: Self-pay | Admitting: Orthopaedic Surgery

## 2020-12-13 ENCOUNTER — Telehealth: Payer: Self-pay

## 2020-12-13 DIAGNOSIS — Z471 Aftercare following joint replacement surgery: Secondary | ICD-10-CM | POA: Diagnosis not present

## 2020-12-13 DIAGNOSIS — Z6841 Body Mass Index (BMI) 40.0 and over, adult: Secondary | ICD-10-CM | POA: Diagnosis not present

## 2020-12-13 DIAGNOSIS — K219 Gastro-esophageal reflux disease without esophagitis: Secondary | ICD-10-CM | POA: Diagnosis not present

## 2020-12-13 DIAGNOSIS — Z7984 Long term (current) use of oral hypoglycemic drugs: Secondary | ICD-10-CM | POA: Diagnosis not present

## 2020-12-13 DIAGNOSIS — G44009 Cluster headache syndrome, unspecified, not intractable: Secondary | ICD-10-CM | POA: Diagnosis not present

## 2020-12-13 DIAGNOSIS — I10 Essential (primary) hypertension: Secondary | ICD-10-CM | POA: Diagnosis not present

## 2020-12-13 DIAGNOSIS — E1169 Type 2 diabetes mellitus with other specified complication: Secondary | ICD-10-CM | POA: Diagnosis not present

## 2020-12-13 DIAGNOSIS — E785 Hyperlipidemia, unspecified: Secondary | ICD-10-CM | POA: Diagnosis not present

## 2020-12-13 NOTE — Telephone Encounter (Signed)
Called patient with pcp recommendations. Patient voiced understanding. 

## 2020-12-13 NOTE — Telephone Encounter (Signed)
I fine with the compression machine.

## 2020-12-13 NOTE — Telephone Encounter (Signed)
Margaretha Sheffield with [facility] called stating the pt fell backwards hitting her dog and her bed but she didn't injure her knee. Pt would like to see about getting a rx for a compression machine due to having swelling and low mobility of her ankle. Lastly Margaretha Sheffield wanted to let Dr.Blackman know the pt had an elevated blood pressure of 154/86 in her R arm and 156/94 in the L arm; and Margaretha Sheffield already notified the pts PCP. Pt would like a CB in regards to compression machine please.   [phone]

## 2020-12-13 NOTE — Telephone Encounter (Signed)
Suspect this is pain related and BP should improve as pain level does.  If it remains elevated, will need OV

## 2020-12-13 NOTE — Telephone Encounter (Signed)
Calling in to report a BP reading of:  Seated resting (right arm) 154/88 - (left arm) - 156/94  Patient is asymptomatic.  Please follow up in regard on any instructions.

## 2020-12-16 ENCOUNTER — Telehealth: Payer: Self-pay

## 2020-12-16 DIAGNOSIS — I10 Essential (primary) hypertension: Secondary | ICD-10-CM | POA: Diagnosis not present

## 2020-12-16 DIAGNOSIS — Z6841 Body Mass Index (BMI) 40.0 and over, adult: Secondary | ICD-10-CM | POA: Diagnosis not present

## 2020-12-16 DIAGNOSIS — E1169 Type 2 diabetes mellitus with other specified complication: Secondary | ICD-10-CM | POA: Diagnosis not present

## 2020-12-16 DIAGNOSIS — Z7984 Long term (current) use of oral hypoglycemic drugs: Secondary | ICD-10-CM | POA: Diagnosis not present

## 2020-12-16 DIAGNOSIS — Z471 Aftercare following joint replacement surgery: Secondary | ICD-10-CM | POA: Diagnosis not present

## 2020-12-16 DIAGNOSIS — K219 Gastro-esophageal reflux disease without esophagitis: Secondary | ICD-10-CM | POA: Diagnosis not present

## 2020-12-16 DIAGNOSIS — G44009 Cluster headache syndrome, unspecified, not intractable: Secondary | ICD-10-CM | POA: Diagnosis not present

## 2020-12-16 DIAGNOSIS — E785 Hyperlipidemia, unspecified: Secondary | ICD-10-CM | POA: Diagnosis not present

## 2020-12-16 NOTE — Telephone Encounter (Signed)
I will reach out to [facility] with [employer] and order it. They can contact her and tell her cost, b/c I'm pretty sure insurance won't cover it completely. Thanks.

## 2020-12-16 NOTE — Telephone Encounter (Signed)
That will be fine and appropriate

## 2020-12-16 NOTE — Telephone Encounter (Signed)
Verbal order given  

## 2020-12-16 NOTE — Telephone Encounter (Signed)
Jamie Hall is reporting right foot drop since surgery in patient.  Jamie Hall would like to request verbal orders for a e-stimulation for the right leg to counter the foot drop   # [phone] Jamie Hall

## 2020-12-17 ENCOUNTER — Telehealth: Payer: Self-pay | Admitting: *Deleted

## 2020-12-17 ENCOUNTER — Other Ambulatory Visit (HOSPITAL_COMMUNITY): Payer: Self-pay

## 2020-12-17 ENCOUNTER — Other Ambulatory Visit: Payer: Self-pay | Admitting: Family Medicine

## 2020-12-17 DIAGNOSIS — G8929 Other chronic pain: Secondary | ICD-10-CM | POA: Diagnosis not present

## 2020-12-17 DIAGNOSIS — L02232 Carbuncle of back [any part, except buttock]: Secondary | ICD-10-CM | POA: Diagnosis not present

## 2020-12-17 MED ORDER — ATORVASTATIN CALCIUM 10 MG PO TABS
ORAL_TABLET | Freq: Every day | ORAL | 0 refills | Status: DC
  Filled 2020-12-17: qty 90, 90d supply, fill #0

## 2020-12-17 NOTE — Telephone Encounter (Signed)
Call to patient and updated her that there are some Med[employer] plus compression socks that CM can leave at the front desk. Confirmed with MD that 20-30 mmHg is ok. Asked patient her to measure her calf and ankle and will provide 2 different sizes to see if these help with swelling and discomfort. Reminded to wear during day and take off at night. She reports her calf, top of foot continue to have no feeling. Recommended continued ice/elevation/compression hose. Will leave at front desk for patient.

## 2020-12-17 NOTE — Telephone Encounter (Signed)
Jamie Hall with [employer] called patient and gave her options related to compression device or a compression sleeve. Insurance won't cover these items and she declined. He said she stated she was angry because she asked about it before surgery. I don't really understand why because it wouldn't change the fact that it is still not covered by insurance. I can call and offer some compression hose maybe. I can take some from Dr. Jess Barters stock he doesn't use.

## 2020-12-17 NOTE — Telephone Encounter (Signed)
Thank You.

## 2020-12-18 DIAGNOSIS — E785 Hyperlipidemia, unspecified: Secondary | ICD-10-CM | POA: Diagnosis not present

## 2020-12-18 DIAGNOSIS — E1169 Type 2 diabetes mellitus with other specified complication: Secondary | ICD-10-CM | POA: Diagnosis not present

## 2020-12-18 DIAGNOSIS — K219 Gastro-esophageal reflux disease without esophagitis: Secondary | ICD-10-CM | POA: Diagnosis not present

## 2020-12-18 DIAGNOSIS — G44009 Cluster headache syndrome, unspecified, not intractable: Secondary | ICD-10-CM | POA: Diagnosis not present

## 2020-12-18 DIAGNOSIS — Z6841 Body Mass Index (BMI) 40.0 and over, adult: Secondary | ICD-10-CM | POA: Diagnosis not present

## 2020-12-18 DIAGNOSIS — Z7984 Long term (current) use of oral hypoglycemic drugs: Secondary | ICD-10-CM | POA: Diagnosis not present

## 2020-12-18 DIAGNOSIS — I10 Essential (primary) hypertension: Secondary | ICD-10-CM | POA: Diagnosis not present

## 2020-12-18 DIAGNOSIS — Z471 Aftercare following joint replacement surgery: Secondary | ICD-10-CM | POA: Diagnosis not present

## 2020-12-19 ENCOUNTER — Encounter: Payer: Self-pay | Admitting: Orthopaedic Surgery

## 2020-12-19 ENCOUNTER — Ambulatory Visit (INDEPENDENT_AMBULATORY_CARE_PROVIDER_SITE_OTHER): Payer: 59 | Admitting: Orthopaedic Surgery

## 2020-12-19 ENCOUNTER — Other Ambulatory Visit (HOSPITAL_COMMUNITY): Payer: Self-pay

## 2020-12-19 DIAGNOSIS — Z96651 Presence of right artificial knee joint: Secondary | ICD-10-CM

## 2020-12-19 DIAGNOSIS — M21371 Foot drop, right foot: Secondary | ICD-10-CM

## 2020-12-19 MED ORDER — GABAPENTIN 300 MG PO CAPS
300.0000 mg | ORAL_CAPSULE | Freq: Every day | ORAL | 3 refills | Status: DC
  Filled 2020-12-19: qty 30, 30d supply, fill #0
  Filled 2021-01-17: qty 30, 30d supply, fill #1
  Filled 2021-02-13: qty 30, 30d supply, fill #2
  Filled 2021-03-16: qty 30, 30d supply, fill #3

## 2020-12-19 NOTE — Progress Notes (Signed)
Jamie Hall is 2 weeks tomorrow status post a right total knee arthroplasty.  She has done incredibly well in terms of her motion.  Unfortunately her postoperative course has been complicated by right foot drop which certainly can happen after knee replacement surgery.  On exam she has almost full extension and her flexion is excellent and well past 100 degrees.  The knee is ligamentously stable.  There is swelling to be expected.  The staples been removed and Steri-Strips applied.  She definitely has foot drop with inability to dorsiflex her foot or toes on the right side.  A lot of her pain and numbness and tingling is coming at night.  I will put her on Neurontin 300 mg at bedtime.  I gave her prescription for getting an AFO which will help as well.  Nerve issue such as this can take even up to a year to resolve but hopefully will get some more resolution as the swelling goes down in time goes by.  She will go down to just an aspirin once a day for a week and then can stop her aspirin.  I would like to see her back in follow-up in 4 weeks but no x-rays are needed.

## 2020-12-20 DIAGNOSIS — E1169 Type 2 diabetes mellitus with other specified complication: Secondary | ICD-10-CM | POA: Diagnosis not present

## 2020-12-20 DIAGNOSIS — Z7984 Long term (current) use of oral hypoglycemic drugs: Secondary | ICD-10-CM | POA: Diagnosis not present

## 2020-12-20 DIAGNOSIS — Z6841 Body Mass Index (BMI) 40.0 and over, adult: Secondary | ICD-10-CM | POA: Diagnosis not present

## 2020-12-20 DIAGNOSIS — E785 Hyperlipidemia, unspecified: Secondary | ICD-10-CM | POA: Diagnosis not present

## 2020-12-20 DIAGNOSIS — K219 Gastro-esophageal reflux disease without esophagitis: Secondary | ICD-10-CM | POA: Diagnosis not present

## 2020-12-20 DIAGNOSIS — G44009 Cluster headache syndrome, unspecified, not intractable: Secondary | ICD-10-CM | POA: Diagnosis not present

## 2020-12-20 DIAGNOSIS — I10 Essential (primary) hypertension: Secondary | ICD-10-CM | POA: Diagnosis not present

## 2020-12-20 DIAGNOSIS — Z471 Aftercare following joint replacement surgery: Secondary | ICD-10-CM | POA: Diagnosis not present

## 2020-12-24 DIAGNOSIS — K219 Gastro-esophageal reflux disease without esophagitis: Secondary | ICD-10-CM | POA: Diagnosis not present

## 2020-12-24 DIAGNOSIS — G44009 Cluster headache syndrome, unspecified, not intractable: Secondary | ICD-10-CM | POA: Diagnosis not present

## 2020-12-24 DIAGNOSIS — E1169 Type 2 diabetes mellitus with other specified complication: Secondary | ICD-10-CM | POA: Diagnosis not present

## 2020-12-24 DIAGNOSIS — I10 Essential (primary) hypertension: Secondary | ICD-10-CM | POA: Diagnosis not present

## 2020-12-24 DIAGNOSIS — Z6841 Body Mass Index (BMI) 40.0 and over, adult: Secondary | ICD-10-CM | POA: Diagnosis not present

## 2020-12-24 DIAGNOSIS — E785 Hyperlipidemia, unspecified: Secondary | ICD-10-CM | POA: Diagnosis not present

## 2020-12-24 DIAGNOSIS — Z471 Aftercare following joint replacement surgery: Secondary | ICD-10-CM | POA: Diagnosis not present

## 2020-12-24 DIAGNOSIS — Z7984 Long term (current) use of oral hypoglycemic drugs: Secondary | ICD-10-CM | POA: Diagnosis not present

## 2020-12-25 DIAGNOSIS — Z6841 Body Mass Index (BMI) 40.0 and over, adult: Secondary | ICD-10-CM | POA: Diagnosis not present

## 2020-12-25 DIAGNOSIS — E1169 Type 2 diabetes mellitus with other specified complication: Secondary | ICD-10-CM | POA: Diagnosis not present

## 2020-12-25 DIAGNOSIS — G44009 Cluster headache syndrome, unspecified, not intractable: Secondary | ICD-10-CM | POA: Diagnosis not present

## 2020-12-25 DIAGNOSIS — K219 Gastro-esophageal reflux disease without esophagitis: Secondary | ICD-10-CM | POA: Diagnosis not present

## 2020-12-25 DIAGNOSIS — Z471 Aftercare following joint replacement surgery: Secondary | ICD-10-CM | POA: Diagnosis not present

## 2020-12-25 DIAGNOSIS — I10 Essential (primary) hypertension: Secondary | ICD-10-CM | POA: Diagnosis not present

## 2020-12-25 DIAGNOSIS — E785 Hyperlipidemia, unspecified: Secondary | ICD-10-CM | POA: Diagnosis not present

## 2020-12-25 DIAGNOSIS — Z7984 Long term (current) use of oral hypoglycemic drugs: Secondary | ICD-10-CM | POA: Diagnosis not present

## 2020-12-27 ENCOUNTER — Telehealth: Payer: Self-pay | Admitting: Orthopaedic Surgery

## 2020-12-27 DIAGNOSIS — E785 Hyperlipidemia, unspecified: Secondary | ICD-10-CM | POA: Diagnosis not present

## 2020-12-27 DIAGNOSIS — Z471 Aftercare following joint replacement surgery: Secondary | ICD-10-CM | POA: Diagnosis not present

## 2020-12-27 DIAGNOSIS — I10 Essential (primary) hypertension: Secondary | ICD-10-CM | POA: Diagnosis not present

## 2020-12-27 DIAGNOSIS — E1169 Type 2 diabetes mellitus with other specified complication: Secondary | ICD-10-CM | POA: Diagnosis not present

## 2020-12-27 DIAGNOSIS — K219 Gastro-esophageal reflux disease without esophagitis: Secondary | ICD-10-CM | POA: Diagnosis not present

## 2020-12-27 DIAGNOSIS — G44009 Cluster headache syndrome, unspecified, not intractable: Secondary | ICD-10-CM | POA: Diagnosis not present

## 2020-12-27 DIAGNOSIS — Z7984 Long term (current) use of oral hypoglycemic drugs: Secondary | ICD-10-CM | POA: Diagnosis not present

## 2020-12-27 DIAGNOSIS — Z6841 Body Mass Index (BMI) 40.0 and over, adult: Secondary | ICD-10-CM | POA: Diagnosis not present

## 2020-12-27 NOTE — Telephone Encounter (Signed)
[place] for placard?

## 2020-12-27 NOTE — Telephone Encounter (Signed)
Yes, that will be fine.  Thanks.

## 2020-12-27 NOTE — Telephone Encounter (Signed)
Patient called requesting a handicap placard. Please call patient when ready fr pick up at [phone].

## 2020-12-30 DIAGNOSIS — Z6841 Body Mass Index (BMI) 40.0 and over, adult: Secondary | ICD-10-CM | POA: Diagnosis not present

## 2020-12-30 DIAGNOSIS — K219 Gastro-esophageal reflux disease without esophagitis: Secondary | ICD-10-CM | POA: Diagnosis not present

## 2020-12-30 DIAGNOSIS — I10 Essential (primary) hypertension: Secondary | ICD-10-CM | POA: Diagnosis not present

## 2020-12-30 DIAGNOSIS — Z7984 Long term (current) use of oral hypoglycemic drugs: Secondary | ICD-10-CM | POA: Diagnosis not present

## 2020-12-30 DIAGNOSIS — Z471 Aftercare following joint replacement surgery: Secondary | ICD-10-CM | POA: Diagnosis not present

## 2020-12-30 DIAGNOSIS — E785 Hyperlipidemia, unspecified: Secondary | ICD-10-CM | POA: Diagnosis not present

## 2020-12-30 DIAGNOSIS — G44009 Cluster headache syndrome, unspecified, not intractable: Secondary | ICD-10-CM | POA: Diagnosis not present

## 2020-12-30 DIAGNOSIS — E1169 Type 2 diabetes mellitus with other specified complication: Secondary | ICD-10-CM | POA: Diagnosis not present

## 2020-12-30 NOTE — Telephone Encounter (Signed)
Placard at front for pick up. I left voicemail for patient advising.

## 2021-01-01 ENCOUNTER — Encounter: Payer: 59 | Admitting: Internal Medicine

## 2021-01-01 DIAGNOSIS — K219 Gastro-esophageal reflux disease without esophagitis: Secondary | ICD-10-CM | POA: Diagnosis not present

## 2021-01-01 DIAGNOSIS — E1169 Type 2 diabetes mellitus with other specified complication: Secondary | ICD-10-CM | POA: Diagnosis not present

## 2021-01-01 DIAGNOSIS — Z7984 Long term (current) use of oral hypoglycemic drugs: Secondary | ICD-10-CM | POA: Diagnosis not present

## 2021-01-01 DIAGNOSIS — G44009 Cluster headache syndrome, unspecified, not intractable: Secondary | ICD-10-CM | POA: Diagnosis not present

## 2021-01-01 DIAGNOSIS — E785 Hyperlipidemia, unspecified: Secondary | ICD-10-CM | POA: Diagnosis not present

## 2021-01-01 DIAGNOSIS — Z471 Aftercare following joint replacement surgery: Secondary | ICD-10-CM | POA: Diagnosis not present

## 2021-01-01 DIAGNOSIS — Z6841 Body Mass Index (BMI) 40.0 and over, adult: Secondary | ICD-10-CM | POA: Diagnosis not present

## 2021-01-01 DIAGNOSIS — I10 Essential (primary) hypertension: Secondary | ICD-10-CM | POA: Diagnosis not present

## 2021-01-02 ENCOUNTER — Telehealth: Payer: Self-pay | Admitting: Orthopaedic Surgery

## 2021-01-02 DIAGNOSIS — Z7984 Long term (current) use of oral hypoglycemic drugs: Secondary | ICD-10-CM | POA: Diagnosis not present

## 2021-01-02 DIAGNOSIS — I10 Essential (primary) hypertension: Secondary | ICD-10-CM | POA: Diagnosis not present

## 2021-01-02 DIAGNOSIS — M21371 Foot drop, right foot: Secondary | ICD-10-CM | POA: Diagnosis not present

## 2021-01-02 DIAGNOSIS — K219 Gastro-esophageal reflux disease without esophagitis: Secondary | ICD-10-CM | POA: Diagnosis not present

## 2021-01-02 DIAGNOSIS — G44009 Cluster headache syndrome, unspecified, not intractable: Secondary | ICD-10-CM | POA: Diagnosis not present

## 2021-01-02 DIAGNOSIS — Z471 Aftercare following joint replacement surgery: Secondary | ICD-10-CM | POA: Diagnosis not present

## 2021-01-02 DIAGNOSIS — E1169 Type 2 diabetes mellitus with other specified complication: Secondary | ICD-10-CM | POA: Diagnosis not present

## 2021-01-02 DIAGNOSIS — Z6841 Body Mass Index (BMI) 40.0 and over, adult: Secondary | ICD-10-CM | POA: Diagnosis not present

## 2021-01-02 DIAGNOSIS — E785 Hyperlipidemia, unspecified: Secondary | ICD-10-CM | POA: Diagnosis not present

## 2021-01-02 NOTE — Telephone Encounter (Signed)
Jamie Hall with [facility] called stating the pt is being discharged due to her meeting her goals and she just wanted to make Dr. Ninfa Linden aware.

## 2021-01-02 NOTE — Telephone Encounter (Signed)
FYI

## 2021-01-13 ENCOUNTER — Encounter: Payer: Self-pay | Admitting: Orthopaedic Surgery

## 2021-01-13 ENCOUNTER — Ambulatory Visit (INDEPENDENT_AMBULATORY_CARE_PROVIDER_SITE_OTHER): Payer: 59 | Admitting: Orthopaedic Surgery

## 2021-01-13 DIAGNOSIS — Z96651 Presence of right artificial knee joint: Secondary | ICD-10-CM

## 2021-01-13 DIAGNOSIS — M21371 Foot drop, right foot: Secondary | ICD-10-CM

## 2021-01-13 NOTE — Progress Notes (Signed)
Jamie Hall is between 5 and 6 weeks status post a right total knee arthroplasty.  Her postoperative course has been complicated by foot drop on the right side which certainly affects her ability to drive.  She has put herself hard through motion of that knee.  She does report right lower extremity numbness and is on Neurontin.  She has not had significant pain.  She is off any type of pain medications.  She has almost full extension of her right knee and full flexion.  There is postoperative swelling to be expected.  She still does have complete foot drop on the right side.  Hopefully this should recover giving the natural history of peroneal nerve palsies and foot drop following knee replacement surgery but can take even up to an average of 13 months.  I would like to see her back in 4 weeks to see how she is doing overall.  I need to keep her out of work at least another 4 weeks as she recovers from surgery.

## 2021-01-15 ENCOUNTER — Telehealth: Payer: Self-pay | Admitting: Orthopaedic Surgery

## 2021-01-15 NOTE — Telephone Encounter (Signed)
Pt called stating disability would like a estimate date of when she will be able to return to work which would be July 19th; pt would like to have this note emailed.  [email or web address]

## 2021-01-16 NOTE — Telephone Encounter (Signed)
emailed

## 2021-01-17 ENCOUNTER — Other Ambulatory Visit: Payer: Self-pay | Admitting: Family Medicine

## 2021-01-17 ENCOUNTER — Other Ambulatory Visit (HOSPITAL_COMMUNITY): Payer: Self-pay

## 2021-01-17 MED ORDER — LISINOPRIL 10 MG PO TABS
ORAL_TABLET | Freq: Every day | ORAL | 1 refills | Status: DC
  Filled 2021-01-17: qty 90, 90d supply, fill #0
  Filled 2021-05-02: qty 90, 90d supply, fill #1

## 2021-01-22 ENCOUNTER — Encounter: Payer: Self-pay | Admitting: *Deleted

## 2021-01-28 DIAGNOSIS — Z20822 Contact with and (suspected) exposure to covid-19: Secondary | ICD-10-CM | POA: Diagnosis not present

## 2021-02-03 ENCOUNTER — Other Ambulatory Visit (HOSPITAL_COMMUNITY): Payer: Self-pay

## 2021-02-06 ENCOUNTER — Other Ambulatory Visit (HOSPITAL_COMMUNITY): Payer: Self-pay

## 2021-02-06 ENCOUNTER — Other Ambulatory Visit: Payer: Self-pay | Admitting: Family Medicine

## 2021-02-06 MED ORDER — METFORMIN HCL 500 MG PO TABS
ORAL_TABLET | Freq: Two times a day (BID) | ORAL | 1 refills | Status: DC
  Filled 2021-02-06: qty 180, 90d supply, fill #0
  Filled 2021-05-26 – 2021-06-06 (×2): qty 180, 90d supply, fill #1

## 2021-02-06 MED FILL — Omeprazole Cap Delayed Release 20 MG: ORAL | 90 days supply | Qty: 90 | Fill #0 | Status: AC

## 2021-02-10 ENCOUNTER — Encounter: Payer: Self-pay | Admitting: Orthopaedic Surgery

## 2021-02-10 ENCOUNTER — Ambulatory Visit (INDEPENDENT_AMBULATORY_CARE_PROVIDER_SITE_OTHER): Payer: 59 | Admitting: Orthopaedic Surgery

## 2021-02-10 DIAGNOSIS — M25571 Pain in right ankle and joints of right foot: Secondary | ICD-10-CM

## 2021-02-10 DIAGNOSIS — Z96651 Presence of right artificial knee joint: Secondary | ICD-10-CM

## 2021-02-10 DIAGNOSIS — M21371 Foot drop, right foot: Secondary | ICD-10-CM

## 2021-02-10 NOTE — Addendum Note (Signed)
Addended by: Meyer Cory on: 02/10/2021 11:39 AM   Modules accepted: Orders

## 2021-02-10 NOTE — Progress Notes (Signed)
Jamie Hall is well-known to me.  She had a right total knee arthroplasty done about 9 weeks ago.  Her postoperative course has been complicated by foot drop.  She does wear an AFO and is driven some.  She works as the Surveyor, quantity of endoscopy.  We had talked in detail today about getting back to work soon but to half days only since we are still dealing with her foot drop and postoperative swelling.  NuPrep on exam her extension is almost full and her flexion is full on the right knee.  Her incisions healed nicely.  There is still some warmth and swelling to be expected at 9 weeks postoperative.  She still has complete foot drop on the right side.  I am going to give her a note allowing her to return to work starting next Monday for 4 hours a day only (half days) for the next 4 weeks.  I would also like to set her up for EMG/nerve conduction studies of the right lower extremity to get a good assessment of the peroneal nerve and the potential for recovery from her foot drop.  She will continue her AFO and keep stretching her ankle.  I do feel that her extension will come back of her right knee since it is almost there anyway and is only limited by her swelling which is appropriate at 9 weeks.  I will see her back in 4 weeks to see how she is doing overall.  I would like a standing AP and lateral of her right knee at that visit.

## 2021-02-11 ENCOUNTER — Other Ambulatory Visit: Payer: Self-pay

## 2021-02-11 ENCOUNTER — Encounter: Payer: Self-pay | Admitting: Family Medicine

## 2021-02-11 ENCOUNTER — Other Ambulatory Visit (HOSPITAL_COMMUNITY): Payer: Self-pay

## 2021-02-11 ENCOUNTER — Ambulatory Visit: Payer: 59 | Admitting: Family Medicine

## 2021-02-11 VITALS — BP 120/82 | HR 72 | Temp 97.7°F | Resp 18 | Ht 63.0 in | Wt 222.0 lb

## 2021-02-11 DIAGNOSIS — E119 Type 2 diabetes mellitus without complications: Secondary | ICD-10-CM | POA: Diagnosis not present

## 2021-02-11 DIAGNOSIS — R202 Paresthesia of skin: Secondary | ICD-10-CM

## 2021-02-11 LAB — BASIC METABOLIC PANEL
BUN: 13 mg/dL (ref 6–23)
CO2: 29 mEq/L (ref 19–32)
Calcium: 9.6 mg/dL (ref 8.4–10.5)
Chloride: 103 mEq/L (ref 96–112)
Creatinine, Ser: 0.73 mg/dL (ref 0.40–1.20)
GFR: 94.1 mL/min (ref >60.00)
Glucose, Bld: 128 mg/dL — ABNORMAL HIGH (ref 70–99)
Potassium: 3.8 mEq/L (ref 3.5–5.1)
Sodium: 141 mEq/L (ref 135–145)

## 2021-02-11 LAB — HEMOGLOBIN A1C: Hgb A1c MFr Bld: 7.2 % — ABNORMAL HIGH (ref 4.6–6.5)

## 2021-02-11 NOTE — Assessment & Plan Note (Signed)
Chronic problem.  Tolerating Metformin w/o difficulty.  On ACE for renal protection.  UTD on eye exam, foot exam.  Check labs.  Adjust meds prn

## 2021-02-11 NOTE — Assessment & Plan Note (Signed)
Pt is down 12 lbs since last visit.  Applauded her efforts at healthy diet since she hasn't been able to exercise.  Will continue to follow.

## 2021-02-11 NOTE — Progress Notes (Signed)
   Subjective:    Patient ID: Jamie Hall, female    DOB: 02-24-1966, 55 y.o.   MRN: [id number]  HPI DM- chronic problem.  On Metformin 500mg  BID.  Last A1C 7.6%.  on ACE for renal protection.  UTD on eye exam, foot exam.  Pt is down 12 lbs since last visit.  No CP, SOB, HAs, visual changes, abd pain, N/V.  No symptomatic lows.  No blisters or sores on feet.  Morbid obesity- pt is down 12 lbs since last visit.  Unable to exercise since knee surgery.   Review of Systems For ROS see HPI   This visit occurred during the SARS-CoV-2 public health emergency.  Safety protocols were in place, including screening questions prior to the visit, additional usage of staff PPE, and extensive cleaning of exam room while observing appropriate contact time as indicated for disinfecting solutions.      Objective:   Physical Exam Vitals reviewed.  Constitutional:      General: She is not in acute distress.    Appearance: Normal appearance. She is well-developed. She is obese.  HENT:     Head: Normocephalic and atraumatic.  Eyes:     Conjunctiva/sclera: Conjunctivae normal.     Pupils: Pupils are equal, round, and reactive to light.  Neck:     Thyroid: No thyromegaly.  Cardiovascular:     Rate and Rhythm: Normal rate and regular rhythm.     Pulses: Normal pulses.     Heart sounds: Normal heart sounds. No murmur heard. Pulmonary:     Effort: Pulmonary effort is normal. No respiratory distress.     Breath sounds: Normal breath sounds.  Abdominal:     General: There is no distension.     Palpations: Abdomen is soft.     Tenderness: There is no abdominal tenderness.  Musculoskeletal:     Cervical back: Normal range of motion and neck supple.     Right lower leg: No edema.     Left lower leg: No edema.  Lymphadenopathy:     Cervical: No cervical adenopathy.  Skin:    General: Skin is warm and dry.  Neurological:     General: No focal deficit present.     Mental Status: She is alert and  oriented to person, place, and time.  Psychiatric:        Mood and Affect: Mood normal.        Behavior: Behavior normal.          Assessment & Plan:

## 2021-02-11 NOTE — Patient Instructions (Signed)
Schedule your complete physical in 3-4 months We'll notify you of your lab results and make any changes if needed Continue to work on healthy diet and exercise as you are able Call with any questions or concerns Stay Safe!  Stay Healthy! Hang In There!!!

## 2021-02-14 ENCOUNTER — Other Ambulatory Visit (HOSPITAL_COMMUNITY): Payer: Self-pay

## 2021-02-18 ENCOUNTER — Encounter: Payer: Self-pay | Admitting: Orthopaedic Surgery

## 2021-02-18 ENCOUNTER — Ambulatory Visit (INDEPENDENT_AMBULATORY_CARE_PROVIDER_SITE_OTHER): Payer: 59 | Admitting: Neurology

## 2021-02-18 ENCOUNTER — Other Ambulatory Visit: Payer: Self-pay

## 2021-02-18 DIAGNOSIS — G5731 Lesion of lateral popliteal nerve, right lower limb: Secondary | ICD-10-CM

## 2021-02-18 DIAGNOSIS — R202 Paresthesia of skin: Secondary | ICD-10-CM | POA: Diagnosis not present

## 2021-02-18 NOTE — Procedures (Signed)
[facility]  [address], [place]  [place], [place] [address] Tel: [phone] Fax:  [phone] Test Date:  02/18/2021  Patient: Jamie Hall DOB: 17-May-1966 Physician: Narda Amber, DO  Sex: Female Height: '5\' 3"'$  Ref Phys: Jean Rosenthal, MD  ID#: [id number]   Technician:    Patient Complaints: This is a 55 year old female referred for evaluation of right foot drop.  NCV & EMG Findings: Extensive electrodiagnostic testing of the right lower extremity shows:  Right superficial peroneal sensory response is absent.  Right sural sensory responses within normal limits. Right peroneal motor response at the extensor digitorum brevis is absent.  Right peroneal motor response at the tibialis anterior shows severely reduced amplitude (0.8 mV), conduction block, and decreased conduction velocity (Poplit-Fib Head, 24 m/s).  Right tibial motor responses within normal limits. Right tibial H reflex study is within normal limits. Despite maximal activation, no motor unit recruitment was seen in the anterior tibialis and extensor hallucis longus muscles.  Reduced recruitment pattern is seen in the right fibularis longus muscles.  Fibrillation potentials are seen in these peroneal innervated muscles.   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 There is no evidence of a sensorimotor polyneuropathy or lumbosacral radiculopathy.   ___________________________ Narda Amber, DO    Nerve Conduction Studies Anti Sensory Summary Table   Stim Site NR Peak (ms) Norm Peak (ms) P-T Amp (V) Norm P-T Amp  Right Sup Peroneal Anti Sensory (Ant Lat Mall)  31C  12 cm NR  <4.6  >4  Right Sural Anti Sensory (Lat Mall)  31C  Calf    2.4 <4.6 11.3 >4   Motor Summary Table   Stim Site NR Onset (ms) Norm Onset (ms) O-P Amp (mV) Norm O-P Amp Site1 Site2 Delta-0 (ms) Dist (cm) Vel  (m/s) Norm Vel (m/s)  Right Peroneal Motor (Ext Dig Brev)  31C  Ankle NR  <6.0  >2.5 B Fib Ankle  0.0  >40  B Fib NR     Poplt B Fib  0.0  >40  Poplt NR            Right Peroneal TA Motor (Tib Ant)  31C  Fib Head    1.3 <4.5 0.8 >3 Poplit Fib Head 3.4 8.0 24 >40  Poplit    4.7  0.5         Right Tibial Motor (Abd Hall Brev)  31C  Ankle    3.3 <6.0 9.4 >4 Knee Ankle 8.7 41.0 47 >40  Knee    12.0  5.9          H Reflex Studies   NR H-Lat (ms) Lat Norm (ms) L-R H-Lat (ms)  Right Tibial (Gastroc)  31C     33.74 <35    EMG   Side Muscle Ins Act Fibs Psw Fasc Number Recrt Dur Dur. Amp Amp. Poly Poly. Comment  Right AntTibialis Nml 1+ Nml Nml NE None - - - - - - N/A  Right Gastroc Nml Nml Nml Nml Nml Nml Nml Nml Nml Nml Nml Nml N/A  Right Flex Dig Long Nml Nml Nml Nml Nml Nml Nml Nml Nml Nml Nml Nml N/A  Right RectFemoris Nml Nml Nml Nml Nml Nml Nml Nml Nml Nml Nml Nml N/A  Right GluteusMed Nml Nml Nml Nml Nml Nml Nml Nml Nml Nml Nml Nml N/A  Right Fibularis Long Nml 1+ Nml Nml 3- Rapid Nml Nml  Nml Nml Nml Nml N/A  Right BicepsFemS Nml Nml Nml Nml Nml Nml Nml Nml Nml Nml Nml Nml N/A  Right ExtHallLong Nml 1+ Nml Nml 2+ None - - - - - - N/A      Waveforms:      Right

## 2021-02-19 ENCOUNTER — Telehealth: Payer: Self-pay | Admitting: Orthopaedic Surgery

## 2021-02-19 NOTE — Telephone Encounter (Signed)
Pt called stating she will need to have pre dental antibiotics sent in and she also wanted to make Dr.Blackman aware her nerve test results are in. Pt would like a CB.   [phone]

## 2021-02-20 ENCOUNTER — Telehealth: Payer: Self-pay

## 2021-02-20 NOTE — Telephone Encounter (Signed)
Faxed to provided number  

## 2021-02-20 NOTE — Telephone Encounter (Signed)
Patient aware that antibiotics are not needed Also of her EMG results Dr. Ninfa Linden put on her MyChart

## 2021-02-20 NOTE — Telephone Encounter (Signed)
Dr. Sung [facility]ab[facility] office would like pre-medication note/letter faxed to [phone].  CB# [phone].  Please advise. Thank you.

## 2021-03-10 ENCOUNTER — Ambulatory Visit (INDEPENDENT_AMBULATORY_CARE_PROVIDER_SITE_OTHER): Payer: 59

## 2021-03-10 ENCOUNTER — Encounter: Payer: Self-pay | Admitting: Orthopaedic Surgery

## 2021-03-10 ENCOUNTER — Other Ambulatory Visit: Payer: Self-pay

## 2021-03-10 ENCOUNTER — Ambulatory Visit (INDEPENDENT_AMBULATORY_CARE_PROVIDER_SITE_OTHER): Payer: 59 | Admitting: Orthopaedic Surgery

## 2021-03-10 DIAGNOSIS — Z96651 Presence of right artificial knee joint: Secondary | ICD-10-CM

## 2021-03-10 NOTE — Progress Notes (Signed)
Jamie Hall is now just over 3 months status post a right total knee arthroplasty.  Her postoperative course was complicated by foot drop.  Nerve studies also confirmed this.  She is now starting to fire her peroneal nerves.  On exam she is able to provide a little bit more dorsiflexion of her foot on that right side which she could not before.  She can dorsiflex her toes but not a great toe as of yet.  Overall she does look better.  She lacks full extension of her right knee by just a few degrees and her flexion is full.  The swelling is not significant.  2 views of the right knee show well-seated press-fit knee arthroplasty with no complicating features.  I am pleased that the nerve is starting to recover and should get a full recovery with time but will take time.  She will continue work on quad strengthening and extension exercises.  I will see her back in 3 months to see how she is doing overall but no x-rays are needed.  All questions and concerns were answered and addressed.

## 2021-03-17 ENCOUNTER — Other Ambulatory Visit (HOSPITAL_COMMUNITY): Payer: Self-pay

## 2021-03-24 ENCOUNTER — Encounter: Payer: 59 | Admitting: Internal Medicine

## 2021-03-26 ENCOUNTER — Other Ambulatory Visit: Payer: Self-pay | Admitting: Family Medicine

## 2021-03-26 ENCOUNTER — Other Ambulatory Visit (HOSPITAL_COMMUNITY): Payer: Self-pay

## 2021-03-26 MED ORDER — ATORVASTATIN CALCIUM 10 MG PO TABS
ORAL_TABLET | Freq: Every day | ORAL | 0 refills | Status: DC
  Filled 2021-03-26: qty 90, 90d supply, fill #0

## 2021-05-02 ENCOUNTER — Other Ambulatory Visit: Payer: Self-pay | Admitting: Orthopaedic Surgery

## 2021-05-02 ENCOUNTER — Other Ambulatory Visit (HOSPITAL_COMMUNITY): Payer: Self-pay

## 2021-05-02 MED ORDER — GABAPENTIN 300 MG PO CAPS
300.0000 mg | ORAL_CAPSULE | Freq: Every day | ORAL | 3 refills | Status: DC
  Filled 2021-05-02: qty 30, 30d supply, fill #0
  Filled 2021-06-03: qty 30, 30d supply, fill #1
  Filled 2021-07-01: qty 30, 30d supply, fill #2

## 2021-05-02 NOTE — Telephone Encounter (Signed)
ok 

## 2021-05-13 ENCOUNTER — Ambulatory Visit: Payer: Self-pay

## 2021-05-13 ENCOUNTER — Ambulatory Visit (INDEPENDENT_AMBULATORY_CARE_PROVIDER_SITE_OTHER): Payer: 59 | Admitting: Orthopaedic Surgery

## 2021-05-13 ENCOUNTER — Other Ambulatory Visit: Payer: Self-pay

## 2021-05-13 ENCOUNTER — Encounter: Payer: Self-pay | Admitting: Orthopaedic Surgery

## 2021-05-13 VITALS — BP 125/89 | HR 80 | Ht 63.0 in | Wt 222.0 lb

## 2021-05-13 DIAGNOSIS — M7541 Impingement syndrome of right shoulder: Secondary | ICD-10-CM | POA: Diagnosis not present

## 2021-05-13 DIAGNOSIS — M25511 Pain in right shoulder: Secondary | ICD-10-CM

## 2021-05-13 NOTE — Progress Notes (Signed)
Office Visit Note   Patient: Jamie Hall           Date of Birth: [birth year]           MRN: [id number] Visit Date: 05/13/2021              Requested by: Midge Minium, MD [address] [place],  [place] [address] PCP: Midge Minium, MD   Assessment & Plan: Visit Diagnoses:  1. Acute pain of right shoulder   2. Impingement syndrome of right shoulder     Plan: Patient likely has some rotator cuff tendinopathy with subacromial bursitis with mild symptoms.  Not blocking her from working she has no problems washing her hair.  With outstretch and overhead activity she has some discomfort.  She can try some anti-inflammatories.  She is due for a new A1c this month.  She can decide if her shoulder gets worse if she like to have a subacromial injection.  We also discussed possible therapy referral.  Shoulder symptoms likely were aggravated using her walker after total knee arthroplasty but now she is walking significantly better.  She has an AFO but she does not use it.  She has to be careful if she is on uneven surface has to make sure she does not catch her toe.  She can return for her shoulder she has increased symptoms.  Follow-Up Instructions: Return if symptoms worsen or fail to improve.   Orders:  Orders Placed This Encounter  Procedures  . XR Shoulder Right   No orders of the defined types were placed in this encounter.     Procedures: No procedures performed   Clinical Data: No additional findings.   Subjective: Chief Complaint  Patient presents with  . Right Shoulder - Pain    HPI 55 year old female seen with right shoulder pain present for about 4 months.  She had total knee arthroplasty with foot drop postop which is gradually improving and used her arms to push herself up.  No problems sleeping with her shoulder she has problems reaching behind her past the posterior axillary line.  She get her arm up overhead.  She notes outstretched overhead  activities she has some discomfort.  No past history of shoulder surgery or injury.  She denies neck pain.  No low back problems.  She does have diabetes type 2.  She had an injection in her knee at 1 point before total knee arthroplasty and her A1c jumped from the mid 6 to upper sevens.  Patient is an Therapist, sports and works in endoscopy.  Review of Systems all the systems noncontributory to HPI.   Objective: Vital Signs: BP 125/89   Pulse 80   Ht 5\' 3"  (1.6 m)   Wt 222 lb (100.7 kg)   BMI 39.33 kg/m   Physical Exam Constitutional:      Appearance: She is well-developed.  HENT:     Head: Normocephalic.     Right Ear: External ear normal.     Left Ear: External ear normal. There is no impacted cerumen.  Eyes:     Pupils: Pupils are equal, round, and reactive to light.  Neck:     Thyroid: No thyromegaly.     Trachea: No tracheal deviation.  Cardiovascular:     Rate and Rhythm: Normal rate.  Pulmonary:     Effort: Pulmonary effort is normal.  Abdominal:     Palpations: Abdomen is soft.  Musculoskeletal:     Cervical  back: No rigidity.  Skin:    General: Skin is warm and dry.  Neurological:     Mental Status: She is alert and oriented to person, place, and time.  Psychiatric:        Behavior: Behavior normal.    Ortho Exam patient is amatory without limping.  She can dorsiflex with firing anterior tib but has weakness.  No EHL EDC function.  Good cervical range of motion no sciatic notch tenderness negative straight leg raise.  Long head of the biceps is stable nontender no subluxation.  No instability of the shoulder.  Negative drop arm test.  Positive Neer negative Hawkins.  Negative Yergason.  Negative Spurling.  Specialty Comments:  No specialty comments available.  Imaging: XR Shoulder Right  Result Date: 05/13/2021 Three-view x-rays right shoulder shows normal glenohumeral joint.  Mild spurring acromioclavicular joint no soft tissue calcification. Impression: Normal right  shoulder x-rays with mild AC degenerative changes.    PMFS History: Patient Active Problem List   Diagnosis Date Noted  . Impingement syndrome of right shoulder 05/13/2021  . Status post total right knee replacement 12/06/2020  . Arthritis of right knee 12/05/2020  . Acute tear medial meniscus, right, subsequent encounter 08/16/2020  . Hyperlipidemia associated with type 2 diabetes mellitus ([facility]) 08/18/2019  . Physical exam 02/03/2019  . Diabetes mellitus without complication ([facility]) 19/62/2297  . Morbid obesity ([place]) 01/17/2018  . HTN (hypertension) 01/17/2018  . Cluster headaches 01/17/2018  . Basal cell carcinoma of skin 02/18/2012   Past Medical History:  Diagnosis Date  . DM type 2 (diabetes mellitus, type 2) ([facility])    followed by pcp  . GERD (gastroesophageal reflux disease)   . Headache   . History of kidney stones   . Hypertension   . OA (osteoarthritis)   . PONV (postoperative nausea and vomiting)   . Right knee meniscal tear     Family History  Problem Relation Age of Onset  . Early death Mother   . Stroke Mother   . Healthy Father   . Healthy Sister   . Healthy Daughter   . Cancer Maternal Grandfather   . Drug abuse Maternal Grandfather   . Hypertension Maternal Grandfather   . Colon cancer Neg Hx   . Esophageal cancer Neg Hx   . Liver cancer Neg Hx   . Pancreatic cancer Neg Hx   . Rectal cancer Neg Hx   . Stomach cancer Neg Hx     Past Surgical History:  Procedure Laterality Date  . BREAST REDUCTION SURGERY  2006  . CARPECTOMY Left 2007   proximal row, wrist  . CYSTOSCOPY/URETEROSCOPY/HOLMIUM LASER/STENT PLACEMENT Left 06/07/2020   Procedure: CYSTOSCOPY/RETROGRADE/URETEROSCOPY/HOLMIUM LASER/STENT PLACEMENT;  Surgeon: Ceasar Mons, MD;  Location: [facility];  Service: Urology;  Laterality: Left;  ONLY NEEDS 45 MIN  . GANGLION CYST EXCISION Right 1999   wrist  . KIDNEY STONE SURGERY  06/07/2020  . KNEE ARTHROSCOPY Right 2000   . KNEE ARTHROSCOPY Right 08/16/2020   Procedure: RIGHT KNEE ARTHROSCOPY WITH PARTIAL MEDIAL MENISCECTOMY;  Surgeon: Mcarthur Rossetti, MD;  Location: [facility];  Service: Orthopedics;  Laterality: Right;  . TONSILLECTOMY  age 66  . TOTAL KNEE ARTHROPLASTY Right 12/06/2020   Procedure: RIGHT TOTAL KNEE ARTHROPLASTY;  Surgeon: Mcarthur Rossetti, MD;  Location: [facility];  Service: Orthopedics;  Laterality: Right;   Social History   Occupational History  . Not on file  Tobacco Use  . Smoking status: Never  .  Smokeless tobacco: Never  Vaping Use  . Vaping Use: Never used  Substance and Sexual Activity  . Alcohol use: Not Currently  . Drug use: Not Currently  . Sexual activity: Yes    Birth control/protection: I.U.D.

## 2021-05-14 ENCOUNTER — Encounter: Payer: 59 | Admitting: Family Medicine

## 2021-05-16 ENCOUNTER — Other Ambulatory Visit: Payer: Self-pay

## 2021-05-16 ENCOUNTER — Ambulatory Visit (AMBULATORY_SURGERY_CENTER): Payer: 59 | Admitting: Internal Medicine

## 2021-05-16 ENCOUNTER — Encounter: Payer: Self-pay | Admitting: Internal Medicine

## 2021-05-16 VITALS — BP 142/78 | HR 74 | Temp 97.7°F | Resp 16 | Ht 63.0 in | Wt 222.0 lb

## 2021-05-16 DIAGNOSIS — E119 Type 2 diabetes mellitus without complications: Secondary | ICD-10-CM | POA: Diagnosis not present

## 2021-05-16 DIAGNOSIS — D124 Benign neoplasm of descending colon: Secondary | ICD-10-CM

## 2021-05-16 DIAGNOSIS — I10 Essential (primary) hypertension: Secondary | ICD-10-CM | POA: Diagnosis not present

## 2021-05-16 DIAGNOSIS — Z86010 Personal history of colonic polyps: Secondary | ICD-10-CM | POA: Diagnosis not present

## 2021-05-16 DIAGNOSIS — Z1211 Encounter for screening for malignant neoplasm of colon: Secondary | ICD-10-CM | POA: Diagnosis not present

## 2021-05-16 DIAGNOSIS — D125 Benign neoplasm of sigmoid colon: Secondary | ICD-10-CM | POA: Diagnosis not present

## 2021-05-16 MED ORDER — SODIUM CHLORIDE 0.9 % IV SOLN: 500.0000 mL | Freq: Once | INTRAVENOUS | Status: DC

## 2021-05-16 NOTE — Progress Notes (Signed)
Sedate, gd SR, tolerated procedure well, VSS, report to RN 

## 2021-05-16 NOTE — Op Note (Signed)
[facility] Patient Name: Jamie Hall Procedure Date: 05/16/2021 2:39 PM MRN: [id number] Endoscopist: Jerene Bears , MD Age: 55 Referring MD:  Date of Birth: Oct 15, 1965 Gender: Female Account #: [id number] Procedure:                Colonoscopy Indications:              High risk colon cancer surveillance: Personal                            history of multiple (3 or more) adenomas, Last                            colonoscopy: July 2019 Medicines:                Monitored Anesthesia Care Procedure:                Pre-Anesthesia Assessment:                           - Prior to the procedure, a History and Physical                            was performed, and patient medications and                            allergies were reviewed. The patient's tolerance of                            previous anesthesia was also reviewed. The risks                            and benefits of the procedure and the sedation                            options and risks were discussed with the patient.                            All questions were answered, and informed consent                            was obtained. Prior Anticoagulants: The patient has                            taken no previous anticoagulant or antiplatelet                            agents. ASA Grade Assessment: II - A patient with                            mild systemic disease. After reviewing the risks                            and benefits, the patient was deemed in  satisfactory condition to undergo the procedure.                           After obtaining informed consent, the colonoscope                            was passed under direct vision. Throughout the                            procedure, the patient's blood pressure, pulse, and                            oxygen saturations were monitored continuously. The                            CF [id number] #[id number] was introduced through the  anus                            and advanced to the terminal ileum. The colonoscopy                            was performed without difficulty. The patient                            tolerated the procedure well. The quality of the                            bowel preparation was good. The terminal ileum,                            ileocecal valve, appendiceal orifice, and rectum                            were photographed. Scope In: 2:51:24 PM Scope Out: 3:07:51 PM Scope Withdrawal Time: 0 hours 13 minutes 13 seconds  Total Procedure Duration: 0 hours 16 minutes 27 seconds  Findings:                 The digital rectal exam was normal.                           A 6 mm polyp was found in the descending colon. The                            polyp was sessile. The polyp was removed with a                            cold snare. Resection and retrieval were complete.                           A 4 mm polyp was found in the sigmoid colon. The                            polyp was sessile. The  polyp was removed with a                            cold snare. Resection and retrieval were complete.                           Multiple small-mouthed diverticula were found in                            the sigmoid colon.                           The retroflexed view of the distal rectum and anal                            verge was normal and showed no anal or rectal                            abnormalities. Complications:            No immediate complications. Estimated Blood Loss:     Estimated blood loss was minimal. Impression:               - One 6 mm polyp in the descending colon, removed                            with a cold snare. Resected and retrieved.                           - One 4 mm polyp in the sigmoid colon, removed with                            a cold snare. Resected and retrieved.                           - Diverticulosis in the sigmoid colon.                           - The  distal rectum and anal verge are normal on                            retroflexion view. Recommendation:           - Patient has a contact number available for                            emergencies. The signs and symptoms of potential                            delayed complications were discussed with the                            patient. Return to normal activities tomorrow.  Written discharge instructions were provided to the                            patient.                           - Resume previous diet.                           - Continue present medications.                           - Await pathology results.                           - Repeat colonoscopy is recommended for                            surveillance (likely in 5 years). The colonoscopy                            date will be determined after pathology results                            from today's exam become available for review. Jerene Bears, MD 05/16/2021 3:34:39 PM This report has been signed electronically.

## 2021-05-16 NOTE — Progress Notes (Signed)
GASTROENTEROLOGY PROCEDURE H&P NOTE   Primary Care Physician: Midge Minium, MD    Reason for Procedure:  History of adenomatous colon polyps  Plan:    Surveillance colonoscopy  Patient is appropriate for endoscopic procedure(s) in the ambulatory ([facility]) setting.  The nature of the procedure, as well as the risks, benefits, and alternatives were carefully and thoroughly reviewed with the patient. Ample time for discussion and questions allowed. The patient understood, was satisfied, and agreed to proceed.     HPI: Jamie Hall is a 55 y.o. female who presents for surveillance colonoscopy.  Medical history as below.  Tolerated the prep.  No abdominal pain, recent chest pain or shortness of breath.  Past Medical History:  Diagnosis Date   DM type 2 (diabetes mellitus, type 2) ([place])    followed by pcp   GERD (gastroesophageal reflux disease)    Headache    History of kidney stones    Hypertension    OA (osteoarthritis)    PONV (postoperative nausea and vomiting)    Right knee meniscal tear     Past Surgical History:  Procedure Laterality Date   BREAST REDUCTION SURGERY  2006   CARPECTOMY Left 2007   proximal row, wrist   CYSTOSCOPY/URETEROSCOPY/HOLMIUM LASER/STENT PLACEMENT Left 06/07/2020   Procedure: CYSTOSCOPY/RETROGRADE/URETEROSCOPY/HOLMIUM LASER/STENT PLACEMENT;  Surgeon: Ceasar Mons, MD;  Location: [facility];  Service: Urology;  Laterality: Left;  ONLY NEEDS 45 MIN   GANGLION CYST EXCISION Right 1999   wrist   KIDNEY STONE SURGERY  06/07/2020   KNEE ARTHROSCOPY Right 2000   KNEE ARTHROSCOPY Right 08/16/2020   Procedure: RIGHT KNEE ARTHROSCOPY WITH PARTIAL MEDIAL MENISCECTOMY;  Surgeon: Mcarthur Rossetti, MD;  Location: [facility];  Service: Orthopedics;  Laterality: Right;   TONSILLECTOMY  age 65   TOTAL KNEE ARTHROPLASTY Right 12/06/2020   Procedure: RIGHT TOTAL KNEE ARTHROPLASTY;  Surgeon: Mcarthur Rossetti, MD;  Location: [facility];  Service: Orthopedics;  Laterality: Right;    Prior to Admission medications   Medication Sig Start Date End Date Taking? Authorizing Provider  atorvastatin (LIPITOR) 10 MG tablet TAKE 1 TABLET BY MOUTH AT BEDTIME 03/26/21 03/26/22 Yes Midge Minium, MD  gabapentin (NEURONTIN) 300 MG capsule Take 1 capsule (300 mg total) by mouth at bedtime. 05/02/21  Yes Mcarthur Rossetti, MD  levonorgestrel (MIRENA) 20 MCG/24HR IUD 1 each by Intrauterine route once.   Yes [provider]  lisinopril (ZESTRIL) 10 MG tablet TAKE 1 TABLET BY MOUTH ONCE DAILY 01/17/21 01/17/22 Yes Midge Minium, MD  metFORMIN (GLUCOPHAGE) 500 MG tablet TAKE 1 TABLET BY MOUTH TWO TIMES DAILY WITH A MEAL 02/06/21 02/06/22 Yes Midge Minium, MD  omeprazole (PRILOSEC) 20 MG capsule TAKE 1 CAPSULE BY MOUTH ONCE DAILY Patient taking differently: Take 20 mg by mouth every other day. 09/17/20 09/17/21 Yes Midge Minium, MD    Current Outpatient Medications  Medication Sig Dispense Refill   atorvastatin (LIPITOR) 10 MG tablet TAKE 1 TABLET BY MOUTH AT BEDTIME 90 tablet 0   gabapentin (NEURONTIN) 300 MG capsule Take 1 capsule (300 mg total) by mouth at bedtime. 30 capsule 3   levonorgestrel (MIRENA) 20 MCG/24HR IUD 1 each by Intrauterine route once.     lisinopril (ZESTRIL) 10 MG tablet TAKE 1 TABLET BY MOUTH ONCE DAILY 90 tablet 1   metFORMIN (GLUCOPHAGE) 500 MG tablet TAKE 1 TABLET BY MOUTH TWO TIMES DAILY WITH A MEAL 180 tablet 1  omeprazole (PRILOSEC) 20 MG capsule TAKE 1 CAPSULE BY MOUTH ONCE DAILY (Patient taking differently: Take 20 mg by mouth every other day.) 90 capsule 1   Current Facility-Administered Medications  Medication Dose Route Frequency Provider Last Rate Last Admin   0.9 %  sodium chloride infusion  500 mL Intravenous Once [place] Pizzimenti, Lajuan Lines, MD        Allergies as of 05/16/2021 - Review Complete 05/16/2021  Allergen Reaction Noted   Bee venom   08/23/2014   Benzoyl peroxide Other (See Comments) 05/02/2020   Ceftin [cefuroxime] Swelling 05/02/2020   Clindamycin/lincomycin Hives 10/23/2015   Codeine Hives 10/23/2015   Dilaudid [hydromorphone] Hives 11/29/2020   Hydrocodone  05/29/2020   Oxycodone-acetaminophen Hives 08/23/2014   Penicillins Hives 10/23/2015   Tape Other (See Comments) 01/17/2018    Family History  Problem Relation Age of Onset   Early death Mother    Stroke Mother    Healthy Father    Healthy Sister    Healthy Daughter    Cancer Maternal Grandfather    Drug abuse Maternal Grandfather    Hypertension Maternal Grandfather    Colon cancer Neg Hx    Esophageal cancer Neg Hx    Liver cancer Neg Hx    Pancreatic cancer Neg Hx    Rectal cancer Neg Hx    Stomach cancer Neg Hx     Social History   Socioeconomic History   Marital status: Married    Spouse name: Not on file   Number of children: Not on file   Years of education: Not on file   Highest education level: Not on file  Occupational History   Not on file  Tobacco Use   Smoking status: Never   Smokeless tobacco: Never  Vaping Use   Vaping Use: Never used  Substance and Sexual Activity   Alcohol use: Not Currently   Drug use: Not Currently   Sexual activity: Yes    Birth control/protection: I.U.D.  Other Topics Concern   Not on file  Social History Narrative   Not on file   Social Determinants of Health   Financial Resource Strain: Not on file  Food Insecurity: Not on file  Transportation Needs: Not on file  Physical Activity: Not on file  Stress: Not on file  Social Connections: Not on file  Intimate Partner Violence: Not on file    Physical Exam: Vital signs in last 24 hours: @BP  132/72   Pulse 76   Temp 97.7 F (36.5 C)   Ht 5\' 3"  (1.6 m)   Wt 222 lb (100.7 kg)   SpO2 96%   BMI 39.33 kg/m  GEN: NAD EYE: Sclerae anicteric ENT: MMM CV: Non-tachycardic Pulm: CTA b/l GI: Soft, NT/ND NEURO:  Alert & Oriented x  3   Zenovia Jarred, MD [facility]  05/16/2021 2:40 PM

## 2021-05-16 NOTE — Progress Notes (Signed)
Called to room to assist during endoscopic procedure.  Patient ID and intended procedure confirmed with present staff. Received instructions for my participation in the procedure from the performing physician.  

## 2021-05-16 NOTE — Progress Notes (Signed)
VS by DT    

## 2021-05-16 NOTE — Patient Instructions (Addendum)
Resume previous medications.  2 polyps removed and sent to pathology.  Await pathology for final recommendations.  Handouts on findings (polyps and diverticulosis) given to patient.      YOU HAD AN ENDOSCOPIC PROCEDURE TODAY AT [facility]:   Refer to the procedure report that was given to you for any specific questions about what was found during the examination.  If the procedure report does not answer your questions, please call your gastroenterologist to clarify.  If you requested that your care partner not be given the details of your procedure findings, then the procedure report has been included in a sealed envelope for you to review at your convenience later.  YOU SHOULD EXPECT: Some feelings of bloating in the abdomen. Passage of more gas than usual.  Walking can help get rid of the air that was put into your GI tract during the procedure and reduce the bloating. If you had a lower endoscopy (such as a colonoscopy or flexible sigmoidoscopy) you may notice spotting of blood in your stool or on the toilet paper. If you underwent a bowel prep for your procedure, you may not have a normal bowel movement for a few days.  Please Note:  You might notice some irritation and congestion in your nose or some drainage.  This is from the oxygen used during your procedure.  There is no need for concern and it should clear up in a day or so.  SYMPTOMS TO REPORT IMMEDIATELY:  Following lower endoscopy (colonoscopy or flexible sigmoidoscopy):  Excessive amounts of blood in the stool  Significant tenderness or worsening of abdominal pains  Swelling of the abdomen that is new, acute  Fever of 100F or higher   For urgent or emergent issues, a gastroenterologist can be reached at any hour by calling [phone]. Do not use MyChart messaging for urgent concerns.    DIET:  We do recommend a small meal at first, but then you may proceed to your regular diet.  Drink plenty of fluids but  you should avoid alcoholic beverages for 24 hours.  ACTIVITY:  You should plan to take it easy for the rest of today and you should NOT DRIVE or use heavy machinery until tomorrow (because of the sedation medicines used during the test).    FOLLOW UP: Our staff will call the number listed on your records 48-72 hours following your procedure to check on you and address any questions or concerns that you may have regarding the information given to you following your procedure. If we do not reach you, we will leave a message.  We will attempt to reach you two times.  During this call, we will ask if you have developed any symptoms of COVID 19. If you develop any symptoms (ie: fever, flu-like symptoms, shortness of breath, cough etc.) before then, please call [phone].  If you test positive for Covid 19 in the 2 weeks post procedure, please call and report this information to [place].    If any biopsies were taken you will be contacted by phone or by letter within the next 1-3 weeks.  Please call us at [phone] if you have not heard about the biopsies in 3 weeks.    SIGNATURES/CONFIDENTIALITY: You and/or your care partner have signed paperwork which will be entered into your electronic medical record.  These signatures attest to the fact that that the information above on your After Visit Summary has been reviewed and is understood.  Full responsibility  of the confidentiality of this discharge information lies with you and/or your care-partner.

## 2021-05-20 ENCOUNTER — Telehealth: Payer: Self-pay | Admitting: *Deleted

## 2021-05-20 NOTE — Telephone Encounter (Signed)
  Follow up Call-  Call back number 05/16/2021  Post procedure Call Back phone  # [phone]  Permission to leave phone message Yes  Some recent data might be hidden     Patient questions:  Do you have a fever, pain , or abdominal swelling? No. Pain Score  0 *  Have you tolerated food without any problems? Yes.    Have you been able to return to your normal activities? Yes.    Do you have any questions about your discharge instructions: Diet   No. Medications  No. Follow up visit  No.  Do you have questions or concerns about your Care? No.  Actions: * If pain score is 4 or above: No action needed, pain <4.  Have you developed a fever since your procedure? no  2.   Have you had an respiratory symptoms (SOB or cough) since your procedure? no  3.   Have you tested positive for COVID 19 since your procedure no  4.   Have you had any family members/close contacts diagnosed with the COVID 19 since your procedure?  no   If yes to any of these questions please route to Joylene John, RN and Joella Prince, RN

## 2021-05-21 ENCOUNTER — Encounter: Payer: Self-pay | Admitting: Internal Medicine

## 2021-05-26 ENCOUNTER — Other Ambulatory Visit (HOSPITAL_COMMUNITY): Payer: Self-pay

## 2021-05-29 DIAGNOSIS — L02232 Carbuncle of back [any part, except buttock]: Secondary | ICD-10-CM | POA: Diagnosis not present

## 2021-05-29 DIAGNOSIS — G8929 Other chronic pain: Secondary | ICD-10-CM | POA: Diagnosis not present

## 2021-06-03 ENCOUNTER — Other Ambulatory Visit (HOSPITAL_COMMUNITY): Payer: Self-pay

## 2021-06-06 ENCOUNTER — Other Ambulatory Visit (HOSPITAL_COMMUNITY): Payer: Self-pay

## 2021-06-10 ENCOUNTER — Other Ambulatory Visit: Payer: Self-pay

## 2021-06-10 ENCOUNTER — Ambulatory Visit (INDEPENDENT_AMBULATORY_CARE_PROVIDER_SITE_OTHER): Payer: 59 | Admitting: Orthopaedic Surgery

## 2021-06-10 ENCOUNTER — Encounter: Payer: Self-pay | Admitting: Orthopaedic Surgery

## 2021-06-10 DIAGNOSIS — Z96651 Presence of right artificial knee joint: Secondary | ICD-10-CM

## 2021-06-10 NOTE — Progress Notes (Signed)
Office Visit Note   Patient: Jamie Hall           Date of Birth: 02-Nov-1965           MRN: [id number] Visit Date: 06/10/2021              Requested by: Midge Minium, MD [address] [place],  [place] [address] PCP: Midge Minium, MD   Assessment & Plan: Visit Diagnoses:  1. Status post total right knee replacement     Plan:  We will send her to formal physical therapy for stretching of the gastrocsoleus and hamstrings.  Work on Forensic scientist.  Home exercise program and modalities.  We will see her back in 6 weeks see how she is doing overall.  Recommend Voltaren gel lateral aspect of the knee 4 g up to 4 times daily.  Questions were encouraged and answered at length.  Follow-Up Instructions: Return in about 6 weeks (around 07/22/2021).   Orders:  No orders of the defined types were placed in this encounter.  No orders of the defined types were placed in this encounter.     Procedures: No procedures performed   Clinical Data: No additional findings.   Subjective: Chief Complaint  Patient presents with   Right Knee - Routine Post Op    HPI Jamie Hall is now 6 months status post right total knee arthroplasty.  She states she is worse off than she was before surgery.  She is having pain in the lateral aspect of the knee and posterior aspect of the knee.  She has had no new injury.  She is doing no physical therapy no home exercise program currently.  She notes she has pain that is constant lateral aspect of the knee is worse whenever she is getting dressed or getting in and out of bed.  She describes it as sharp stabbing pain.  She still has numbness lateral aspect of her right calf.  Her foot drop has improved to the point that she no longer has to wear the AFO brace.  Review of Systems See HPI.  Objective: Vital Signs: There were no vitals taken for this visit.  Physical Exam General: Well-developed well-nourished female no acute distress  mood affect appropriate Ortho Exam Right knee surgical incisions well-healed.  No instability valgus varus stressing.  Full extension flexion to approximately 110 degrees.  There is no abnormal warmth erythema or effusion.  She has tenderness lateral aspect of the knee near the lateral collateral ligament insertion and just anterior to the knee Just below the Joint Line.  Calf supple nontender.  Specialty Comments:  No specialty comments available.  Imaging: No results found.   PMFS History: Patient Active Problem List   Diagnosis Date Noted   Impingement syndrome of right shoulder 05/13/2021   Status post total right knee replacement 12/06/2020   Arthritis of right knee 12/05/2020   Acute tear medial meniscus, right, subsequent encounter 08/16/2020   Hyperlipidemia associated with type 2 diabetes mellitus ([place]) 08/18/2019   Physical exam 02/03/2019   Diabetes mellitus without complication ([place]) 16/60/6301   Morbid obesity ([place]) 01/17/2018   HTN (hypertension) 01/17/2018   Cluster headaches 01/17/2018   Basal cell carcinoma of skin 02/18/2012   Past Medical History:  Diagnosis Date   DM type 2 (diabetes mellitus, type 2) ([place])    followed by pcp   GERD (gastroesophageal reflux disease)    Headache    History of kidney stones  Hypertension    OA (osteoarthritis)    PONV (postoperative nausea and vomiting)    Right knee meniscal tear     Family History  Problem Relation Age of Onset   Early death Mother    Stroke Mother    Healthy Father    Healthy Sister    Healthy Daughter    Cancer Maternal Grandfather    Drug abuse Maternal Grandfather    Hypertension Maternal Grandfather    Colon cancer Neg Hx    Esophageal cancer Neg Hx    Liver cancer Neg Hx    Pancreatic cancer Neg Hx    Rectal cancer Neg Hx    Stomach cancer Neg Hx     Past Surgical History:  Procedure Laterality Date   BREAST REDUCTION SURGERY  2006   CARPECTOMY Left 2007   proximal row, wrist    CYSTOSCOPY/URETEROSCOPY/HOLMIUM LASER/STENT PLACEMENT Left 06/07/2020   Procedure: CYSTOSCOPY/RETROGRADE/URETEROSCOPY/HOLMIUM LASER/STENT PLACEMENT;  Surgeon: Ceasar Mons, MD;  Location: [facility];  Service: Urology;  Laterality: Left;  ONLY NEEDS 45 MIN   GANGLION CYST EXCISION Right 1999   wrist   KIDNEY STONE SURGERY  06/07/2020   KNEE ARTHROSCOPY Right 2000   KNEE ARTHROSCOPY Right 08/16/2020   Procedure: RIGHT KNEE ARTHROSCOPY WITH PARTIAL MEDIAL MENISCECTOMY;  Surgeon: Mcarthur Rossetti, MD;  Location: [facility];  Service: Orthopedics;  Laterality: Right;   TONSILLECTOMY  age 13   TOTAL KNEE ARTHROPLASTY Right 12/06/2020   Procedure: RIGHT TOTAL KNEE ARTHROPLASTY;  Surgeon: Mcarthur Rossetti, MD;  Location: [facility];  Service: Orthopedics;  Laterality: Right;   Social History   Occupational History   Not on file  Tobacco Use   Smoking status: Never   Smokeless tobacco: Never  Vaping Use   Vaping Use: Never used  Substance and Sexual Activity   Alcohol use: Not Currently   Drug use: Not Currently   Sexual activity: Yes    Birth control/protection: I.U.D.

## 2021-06-10 NOTE — Addendum Note (Signed)
Addended by: Robyne Peers on: 06/10/2021 10:51 AM   Modules accepted: Orders

## 2021-06-11 DIAGNOSIS — L02232 Carbuncle of back [any part, except buttock]: Secondary | ICD-10-CM | POA: Diagnosis not present

## 2021-06-11 DIAGNOSIS — G8929 Other chronic pain: Secondary | ICD-10-CM | POA: Diagnosis not present

## 2021-06-12 ENCOUNTER — Other Ambulatory Visit: Payer: Self-pay

## 2021-06-12 ENCOUNTER — Encounter: Payer: Self-pay | Admitting: Physical Therapy

## 2021-06-12 ENCOUNTER — Ambulatory Visit: Payer: 59 | Attending: Physician Assistant | Admitting: Physical Therapy

## 2021-06-12 DIAGNOSIS — R2689 Other abnormalities of gait and mobility: Secondary | ICD-10-CM | POA: Insufficient documentation

## 2021-06-12 DIAGNOSIS — M25561 Pain in right knee: Secondary | ICD-10-CM | POA: Insufficient documentation

## 2021-06-12 DIAGNOSIS — M6281 Muscle weakness (generalized): Secondary | ICD-10-CM | POA: Insufficient documentation

## 2021-06-12 DIAGNOSIS — Z96651 Presence of right artificial knee joint: Secondary | ICD-10-CM | POA: Diagnosis not present

## 2021-06-12 NOTE — Therapy (Signed)
Jamie Hall, [place], [address] Phone: [phone]   Fax:  [phone]  Physical Therapy Evaluation  Patient Details  Name: Jamie Hall MRN: [id number] Date of Birth: [birth year] Referring Provider (PT): Jamie Hall, [place]  Encounter Date: 06/12/2021   PT End of Session - 06/13/21 1241     Visit Number 1    Number of Visits 16    Date for PT Re-Evaluation 08/08/21    Authorization Type UMR    PT Start Time 1700    PT Stop Time 6294    PT Time Calculation (min) 45 min    Behavior During Therapy WFL for tasks assessed/performed             Past Medical History:  Diagnosis Date   DM type 2 (diabetes mellitus, type 2) ([place])    followed by pcp   GERD (gastroesophageal reflux disease)    Headache    History of kidney stones    Hypertension    OA (osteoarthritis)    PONV (postoperative nausea and vomiting)    Right knee meniscal tear     Past Surgical History:  Procedure Laterality Date   BREAST REDUCTION SURGERY  2006   CARPECTOMY Left 2007   proximal row, wrist   CYSTOSCOPY/URETEROSCOPY/HOLMIUM LASER/STENT PLACEMENT Left 06/07/2020   Procedure: CYSTOSCOPY/RETROGRADE/URETEROSCOPY/HOLMIUM LASER/STENT PLACEMENT;  Surgeon: Jamie Mons, MD;  Location: [facility];  Service: Urology;  Laterality: Left;  ONLY NEEDS 45 MIN   GANGLION CYST EXCISION Right 1999   wrist   KIDNEY STONE SURGERY  06/07/2020   KNEE ARTHROSCOPY Right 2000   KNEE ARTHROSCOPY Right 08/16/2020   Procedure: RIGHT KNEE ARTHROSCOPY WITH PARTIAL MEDIAL MENISCECTOMY;  Surgeon: Jamie Rossetti, MD;  Location: [facility];  Service: Orthopedics;  Laterality: Right;   TONSILLECTOMY  age 55   TOTAL KNEE ARTHROPLASTY Right 12/06/2020   Procedure: RIGHT TOTAL KNEE ARTHROPLASTY;  Surgeon: Jamie Rossetti, MD;  Location: [facility];  Service: Orthopedics;  Laterality: Right;     There were no vitals filed for this visit.  Subjective:   Jamie Hall is a 55 y.o. female who presents to clinic with chief complaint of R knee pain (6 months) s/p TKA.    MOI/History of condition: From referring provider's office:   11/15:  "[name] is now 6 months status post right total knee arthroplasty.  She states she is worse off than she was before surgery.  She is having pain in the lateral aspect of the knee and posterior aspect of the knee.  She has had no new injury.  She is doing no physical therapy no home exercise program currently.  She notes she has pain that is constant lateral aspect of the knee is worse whenever she is getting dressed or getting in and out of bed.  She describes it as sharp stabbing pain.  She still has numbness lateral aspect of her right calf.  Her foot drop has improved to the point that she no longer has to wear the AFO brace."   7/18: "Jamie Hall is well-known to me.  She had a right total knee arthroplasty done about 9 weeks ago.  Her postoperative course has been complicated by foot drop.  She does wear an AFO and is driven some.  She works as the Surveyor, quantity of endoscopy.  We had talked in detail today about getting back to work soon but to half days only since we  are still dealing with her foot drop and postoperative swelling.  NuPrep on exam her extension is almost full and her flexion is full on the right knee.  Her incisions healed nicely.  There is still some warmth and swelling to be expected at 9 weeks postoperative.  She still has complete foot drop on the right side.  I am going to give her a note allowing her to return to work starting next Monday for 4 hours a day only (half days) for the next 4 weeks.  I would also like to set her up for EMG/nerve conduction studies of the right lower extremity to get a good assessment of the peroneal nerve and the potential for recovery from her foot drop.  She will continue her AFO and keep stretching  her ankle.  I do feel that her extension will come back of her right knee since it is almost there anyway and is only limited by her swelling which is appropriate at 9 weeks.  I will see her back in 4 weeks to see how she is doing overall.  I would like a standing AP and lateral of her right knee at that visit."  Jamie Hall reports that she has a constant nagging pain in the anterolateral aspect of her knee (P1) in the last couple of weeks she she has also developed a pain in the R popliteal fossa (P2); insidious onset and not related to movement typically.  She is concerned this may be a bakers cyst.  Red flags: R foot drop/DF weakness (known and improving)  P1 Pain location: R anterolateral aspect of the knee 48 hour pain intensity:  highest 3/10  current 0/10,  best 0/10 Aggs: (anterior knee) pressure, w/b, movement Eases: rest, ice Nature: near constant with activity, sharp Severity: low/mod Irritability: low Stage: Chronic Stability: unchanged  P2 Pain location: R intermittent pain in popliteal space 48 hour pain intensity:  highest 6/10 (posterior pain) current 0/10,  best 0/10 Aggs: occurs at night while relaxing, not associated with movement Eases: rest, ice Nature:  sharp and stabbing Severity: moderate/high Irritability: low Stage: Subacute Stability: unchanged 24 hour pattern: worse at night  Vocation/requirements: assistant head of endoscopy, office job, but occasionally on feet all day Hobbies: NA Functional limitations/goals (if different from vocation and hobbies): get full ext ROM, reduce pain Home environment: lives at home with husband, 2x 8 stairs with rail Assistive device: none  Significant PMH: DM TII     [facility] PT Assessment - 06/13/21 0001       Assessment   Medical Diagnosis Referral diagnosis: Status post total right knee replacement (L24.401)    Referring Provider (PT) Jamie Pelt, PA-C    Onset Date/Surgical Date 12/07/20      Precautions    Precaution Comments Significant PMH: DM TII      Restrictions   Other Position/Activity Restrictions none      Balance Screen   Has the patient fallen in the past 6 months No      Prior Function   Level of Independence Independent      Observation/Other Assessments   Observations antalgic gait, reduced time in stance R    Focus on Therapeutic Outcomes (FOTO)  next visit      Sensation   Light Touch --   diminished lateral aspect of lower leg     Functional Tests   Functional tests Other      Other:   Other/ Comments progressive balance test reveals mod deficit  in R SLS with dynamic activities compared to L      PROM   Overall PROM Comments R DF limited compared to L    Right Knee Extension 5    Right Knee Flexion --   WNL   Left Knee Extension --   WNL   Left Knee Flexion --   WNL     Strength   Overall Strength Comments Bil LE strength WNL with exception of R DF which is 3+/5 in available range      Palpation   Palpation comment TTP R Anterolateral joint line with mild/mod edema                        Objective measurements completed on examination: See above findings.                PT Education - 06/13/21 1239     Education Details POC, diagnosis, prognosis, HEP.  Pt educated via explanation, demonstration, and handout (HEP).  Pt confirms understanding verbally.              PT Short Term Goals - 06/13/21 1244       PT SHORT TERM GOAL #1   Title Jamie Hall will be >75% HEP compliant to improve carryover between sessions and facilitate independent management of condition    Target Date 07/04/21               PT Long Term Goals - 06/13/21 1245       PT LONG TERM GOAL #1   Title Target date for all long term goals:  08/07/2021      PT LONG TERM GOAL #2   Title Jamie Hall will be able to stand for in R SLS stance on unstable surface with repeated 10x L hip flexion, to show a significant improvement in balance in order to  reduce fall risk  EVAL: 5x R SLS with L hip flexion before L toe touch      PT LONG TERM GOAL #3   Title Jamie Hall will improve right ankle DF to equivocal category on FMS ankle DF screen (pt stands in tandem stance and performs closed chain DF of rear foot to maximal DF without lifting heel, categories are measured based on vertical line from forward foot's medial malleolus  if patella does not reach line = fail, if patella falls within line = equivocal, if patella exceeds line = pass) to normalize terminal stance phase of gait and reduce stress on posterior ankle structures  EVAL: fail      PT LONG TERM GOAL #4   Title Jamie Hall will improve the following MMTs to >/= 4/5 to show improvement in strength:  R DF  EVAL: 3+/5 in available range      PT LONG TERM GOAL #5   Title Jamie Hall will achieve 2 degrees knee extension by D/C (see POC end date) to improve stability in stance phase of gait  EVAL: 5 degrees                    Plan - 06/13/21 1242     Clinical Impression Statement Jamie Hall is a 55 y.o. female who presents to clinic with signs and sxs consistent with R knee pain s/p R TKA 12/07/20.  Her recovery was complicated by R foot drop which is slowly improving.  At this point she has some mild/moderate anterolateral pain which is point tender.  She is also having some pain in the  popliteal fossa which is not reproduced on exam.  The etiology of posterior pain is not clear at this time.  Overall she is quite strong with the exception of R DF.  Pt presents with pain and impairments/deficits in: gait, balance, R ankle DF ROM, R ankle DF strength.  Activity limitations include: crossing legs.  Participation limitations include: not necessarily limited, but has significant pain at work and at night.  Pt will benefit from skilled therapy to address pain and the listed deficits in order to achieve functional goals, enable safety and independence in completion of daily tasks, and return to  PLOF.    Stability/Clinical Decision Making Evolving/Moderate complexity    Clinical Decision Making Moderate    Rehab Potential Good    PT Frequency 2x / week    PT Duration 8 weeks    PT Treatment/Interventions ADLs/Self Care Home Management;Aquatic Therapy;Iontophoresis 4mg /ml Dexamethasone;Electrical Stimulation;Gait training;Therapeutic activities;Therapeutic exercise;Neuromuscular re-education;Manual techniques;Dry needling;Vasopneumatic Device    PT Next Visit Plan TAKE FOTO  normalize gait; progress: balance, gait, knee ext ROM; e-stim for tib anterior as needed    PT Home Exercise Plan [id number]    Consulted and Agree with Plan of Care Patient             Patient will benefit from skilled therapeutic intervention in order to improve the following deficits and impairments:  Abnormal gait, Decreased balance, Difficulty walking, Pain, Decreased strength  Visit Diagnosis: Right knee pain, unspecified chronicity  Balance problem  Muscle weakness  Other abnormalities of gait and mobility     Problem List Patient Active Problem List   Diagnosis Date Noted   Impingement syndrome of right shoulder 05/13/2021   Status post total right knee replacement 12/06/2020   Arthritis of right knee 12/05/2020   Acute tear medial meniscus, right, subsequent encounter 08/16/2020   Hyperlipidemia associated with type 2 diabetes mellitus ([place]) 08/18/2019   Physical exam 02/03/2019   Diabetes mellitus without complication ([place]) 03/55/9741   Morbid obesity ([place]) 01/17/2018   HTN (hypertension) 01/17/2018   Cluster headaches 01/17/2018   Basal cell carcinoma of skin 02/18/2012    Jamie Hall, PT 06/13/2021, 12:53 PM  [facility] [address] [place], [place], [address] Phone: [phone]   Fax:  [phone]  Name: EVIANA SIBILIA MRN: [id number] Date of Birth: 1966-07-23

## 2021-06-13 NOTE — Patient Instructions (Signed)
Access Code: [email or web address] URL: [email or web address] Date: 06/13/2021 Prepared by: Shearon Balo  Exercises Soleus Stretch on Wall - 1 x daily - 7 x weekly - 1 sets - 3 reps - 29'' hold Gastroc Stretch on Wall - 1 x daily - 7 x weekly - 1 sets - 3 reps - 72'' hold

## 2021-06-18 ENCOUNTER — Other Ambulatory Visit: Payer: Self-pay

## 2021-06-18 ENCOUNTER — Ambulatory Visit: Payer: 59 | Admitting: Physical Therapy

## 2021-06-18 ENCOUNTER — Encounter: Payer: Self-pay | Admitting: Physical Therapy

## 2021-06-18 DIAGNOSIS — M6281 Muscle weakness (generalized): Secondary | ICD-10-CM

## 2021-06-18 DIAGNOSIS — R2689 Other abnormalities of gait and mobility: Secondary | ICD-10-CM

## 2021-06-18 DIAGNOSIS — M25561 Pain in right knee: Secondary | ICD-10-CM

## 2021-06-18 DIAGNOSIS — Z96651 Presence of right artificial knee joint: Secondary | ICD-10-CM | POA: Diagnosis not present

## 2021-06-18 NOTE — Therapy (Signed)
[place], [place], [address] Phone: [phone]   Fax:  [phone]  Physical Therapy Treatment  Patient Details  Name: Jamie Hall MRN: [id number] Date of Birth: 12-15-1965 Referring Provider (PT): Pete Pelt, [place]   Encounter Date: 06/18/2021   PT End of Session - 06/18/21 1315     Visit Number 2    Number of Visits 16    Date for PT Re-Evaluation 08/08/21    Authorization Type UMR- FOTO    PT Start Time 1320    PT Stop Time 1408    PT Time Calculation (min) 48 min             Past Medical History:  Diagnosis Date   DM type 2 (diabetes mellitus, type 2) ([place])    followed by pcp   GERD (gastroesophageal reflux disease)    Headache    History of kidney stones    Hypertension    OA (osteoarthritis)    PONV (postoperative nausea and vomiting)    Right knee meniscal tear     Past Surgical History:  Procedure Laterality Date   BREAST REDUCTION SURGERY  2006   CARPECTOMY Left 2007   proximal row, wrist   CYSTOSCOPY/URETEROSCOPY/HOLMIUM LASER/STENT PLACEMENT Left 06/07/2020   Procedure: CYSTOSCOPY/RETROGRADE/URETEROSCOPY/HOLMIUM LASER/STENT PLACEMENT;  Surgeon: Ceasar Mons, MD;  Location: [facility];  Service: Urology;  Laterality: Left;  ONLY NEEDS 45 MIN   GANGLION CYST EXCISION Right 1999   wrist   KIDNEY STONE SURGERY  06/07/2020   KNEE ARTHROSCOPY Right 2000   KNEE ARTHROSCOPY Right 08/16/2020   Procedure: RIGHT KNEE ARTHROSCOPY WITH PARTIAL MEDIAL MENISCECTOMY;  Surgeon: Mcarthur Rossetti, MD;  Location: [facility];  Service: Orthopedics;  Laterality: Right;   TONSILLECTOMY  age 53   TOTAL KNEE ARTHROPLASTY Right 12/06/2020   Procedure: RIGHT TOTAL KNEE ARTHROPLASTY;  Surgeon: Mcarthur Rossetti, MD;  Location: [facility];  Service: Orthopedics;  Laterality: Right;    There were no vitals filed for this visit.    Subjective Assessment - 06/18/21 1315     Subjective Pain with sitting, moving , walking, pain is at lateral knee always.    Currently in Pain? No/denies    Pain Score --   1-2/10 lateral knee with most activity            [facility] Adult PT Treatment/Exercise:  Therapeutic Exercise: - Rec bike L1 x 5 minutes -L2 seated baps  -long sitting yellow band eccentric DF - seated towel slides for eversion - prone quad stretch with Ir and ER -Supine hamstring stretch with Hip IR and ER   Manual Therapy: -   Neuromuscular re-ed: -   Therapeutic Activity: -   Self-care/Home Management: - Modalities  -ice pack x 8 minutes to right knee                  PT Short Term Goals - 06/13/21 1244       PT SHORT TERM GOAL #1   Title Alyannah will be >75% HEP compliant to improve carryover between sessions and facilitate independent management of condition    Target Date 07/04/21               PT Long Term Goals - 06/13/21 1245       PT LONG TERM GOAL #1   Title Target date for all long term goals:  08/07/2021      PT LONG TERM  GOAL #2   Title [place] will be able to stand for in R SLS stance on unstable surface with repeated 10x L hip flexion, to show a significant improvement in balance in order to reduce fall risk  EVAL: 5x R SLS with L hip flexion before L toe touch      PT LONG TERM GOAL #3   Title Jodelle will improve right ankle DF to equivocal category on FMS ankle DF screen (pt stands in tandem stance and performs closed chain DF of rear foot to maximal DF without lifting heel, categories are measured based on vertical line from forward foot's medial malleolus  if patella does not reach line = fail, if patella falls within line = equivocal, if patella exceeds line = pass) to normalize terminal stance phase of gait and reduce stress on posterior ankle structures  EVAL: fail      PT LONG TERM GOAL #4   Title Alanii will improve the following MMTs to >/=  4/5 to show improvement in strength:  R DF  EVAL: 3+/5 in available range      PT LONG TERM GOAL #5   Title [place]ald will achieve 2 degrees knee extension by D/C (see POC end date) to improve stability in stance phase of gait  EVAL: 5 degrees                   Plan - 06/18/21 1337     Clinical Impression Statement Pt reports Compliance with HEP strethces. She reports less posterior knee pain. Her pain is 0/10 on arrival but is normally a constant pain at lateral knee. Worked on knee ROM with hamtring and quad stretches targeting lateral musculature. Worked on ankle strength with eccentric control and Baps board. At end of session pt visible able to DF and evert with improved ROM.    PT Treatment/Interventions ADLs/Self Care Home Management;Aquatic Therapy;Iontophoresis 4mg /ml Dexamethasone;Electrical Stimulation;Gait training;Therapeutic activities;Therapeutic exercise;Neuromuscular re-education;Manual techniques;Dry needling;Vasopneumatic Device    PT Next Visit Plan  normalize gait; progress: balance, gait, knee ext ROM; e-stim for tib anterior as needed    PT Home Exercise Plan [id number]             Patient will benefit from skilled therapeutic intervention in order to improve the following deficits and impairments:  Abnormal gait, Decreased balance, Difficulty walking, Pain, Decreased strength  Visit Diagnosis: Right knee pain, unspecified chronicity  Balance problem  Muscle weakness  Other abnormalities of gait and mobility     Problem List Patient Active Problem List   Diagnosis Date Noted   Impingement syndrome of right shoulder 05/13/2021   Status post total right knee replacement 12/06/2020   Arthritis of right knee 12/05/2020   Acute tear medial meniscus, right, subsequent encounter 08/16/2020   Hyperlipidemia associated with type 2 diabetes mellitus ([place]) 08/18/2019   Physical exam 02/03/2019   Diabetes mellitus without complication ([place]) 95/62/1308    Morbid obesity ([place]) 01/17/2018   HTN (hypertension) 01/17/2018   Cluster headaches 01/17/2018   Basal cell carcinoma of skin 02/18/2012    Dorene Ar, PTA 06/18/2021, 2:08 PM  [facility] [address] [place], [place], [address] Phone: [phone]   Fax:  [phone]  Name: MACEL YEARSLEY MRN: [id number] Date of Birth: June 08, 1966

## 2021-06-25 ENCOUNTER — Ambulatory Visit: Payer: 59

## 2021-06-27 ENCOUNTER — Encounter: Payer: Self-pay | Admitting: Physical Therapy

## 2021-06-27 ENCOUNTER — Ambulatory Visit: Payer: 59 | Attending: Physician Assistant | Admitting: Physical Therapy

## 2021-06-27 ENCOUNTER — Other Ambulatory Visit: Payer: Self-pay

## 2021-06-27 DIAGNOSIS — M25561 Pain in right knee: Secondary | ICD-10-CM | POA: Diagnosis not present

## 2021-06-27 DIAGNOSIS — M6281 Muscle weakness (generalized): Secondary | ICD-10-CM | POA: Insufficient documentation

## 2021-06-27 DIAGNOSIS — R2689 Other abnormalities of gait and mobility: Secondary | ICD-10-CM | POA: Insufficient documentation

## 2021-06-27 NOTE — Therapy (Signed)
[place], [place], [address] Phone: [phone]   Fax:  [phone]  Physical Therapy Treatment  Patient Details  Name: MACRINA LEHNERT MRN: [id number] Date of Birth: Apr 13, 1966 Referring Provider (PT): Pete Pelt, [place]   Encounter Date: 06/27/2021   PT End of Session - 06/27/21 1342     Visit Number 3    Number of Visits 16    Date for PT Re-Evaluation 08/08/21    Authorization Type UMR- FOTO    PT Start Time 4235    PT Stop Time 1425    PT Time Calculation (min) 43 min             Past Medical History:  Diagnosis Date   DM type 2 (diabetes mellitus, type 2) ([place])    followed by pcp   GERD (gastroesophageal reflux disease)    Headache    History of kidney stones    Hypertension    OA (osteoarthritis)    PONV (postoperative nausea and vomiting)    Right knee meniscal tear     Past Surgical History:  Procedure Laterality Date   BREAST REDUCTION SURGERY  2006   CARPECTOMY Left 2007   proximal row, wrist   CYSTOSCOPY/URETEROSCOPY/HOLMIUM LASER/STENT PLACEMENT Left 06/07/2020   Procedure: CYSTOSCOPY/RETROGRADE/URETEROSCOPY/HOLMIUM LASER/STENT PLACEMENT;  Surgeon: Ceasar Mons, MD;  Location: [facility];  Service: Urology;  Laterality: Left;  ONLY NEEDS 45 MIN   GANGLION CYST EXCISION Right 1999   wrist   KIDNEY STONE SURGERY  06/07/2020   KNEE ARTHROSCOPY Right 2000   KNEE ARTHROSCOPY Right 08/16/2020   Procedure: RIGHT KNEE ARTHROSCOPY WITH PARTIAL MEDIAL MENISCECTOMY;  Surgeon: Mcarthur Rossetti, MD;  Location: [facility];  Service: Orthopedics;  Laterality: Right;   TONSILLECTOMY  age 4   TOTAL KNEE ARTHROPLASTY Right 12/06/2020   Procedure: RIGHT TOTAL KNEE ARTHROPLASTY;  Surgeon: Mcarthur Rossetti, MD;  Location: [facility];  Service: Orthopedics;  Laterality: Right;    There were no vitals filed for this visit.   Subjective  Assessment - 06/27/21 1345     Subjective Pt reports that she has been sick, but is know feeling better.  0/10 knee pain currently, with occasional high lateral knee pain.                [facility] Adult PT Treatment/Exercise - 06/27/21 0001       Blood Flow Restriction   Blood Flow Restriction Yes      Blood Flow Restriction-Positions    Blood Flow Restriction Position Sitting      BFR Sitting   Sitting Limb Occulsion Pressure (mmHg) 300   error at 320 mmHg   Sitting Exercise Pressure (mmHg) 160    Sitting Exercise Prescription 30,15,15,15, reps w/ 30-60 sec rest    Sitting Exercise Prescription Comment LAQ - 5#                    [facility] Adult PT Treatment/Exercise:   Therapeutic Exercise: - Rec bike L2 x 5 minutes - contract - relax - contract  - HS and RF stretch - heel raise on step - 2x15  - 4'' step up with contralateral march - 2x10 - hip hike on step 3x10 - LAQ - BFR    Neuromuscular re-ed, to improve balance and reduce fall risk: - SLS on foam 30'' bouts - tandem on foam rebounder throws - 2x15 - S/L rocker board D/F P/F - 30x  PT Short Term Goals - 06/13/21 1244       PT SHORT TERM GOAL #1   Title Gavriella will be >75% HEP compliant to improve carryover between sessions and facilitate independent management of condition    Target Date 07/04/21               PT Long Term Goals - 06/13/21 1245       PT LONG TERM GOAL #1   Title Target date for all long term goals:  08/07/2021      PT LONG TERM GOAL #2   Title Libbi will be able to stand for in R SLS stance on unstable surface with repeated 10x L hip flexion, to show a significant improvement in balance in order to reduce fall risk  EVAL: 5x R SLS with L hip flexion before L toe touch      PT LONG TERM GOAL #3   Title Penina will improve right ankle DF to equivocal category on FMS ankle DF screen (pt stands in tandem stance and performs closed chain DF of rear foot to maximal DF  without lifting heel, categories are measured based on vertical line from forward foot's medial malleolus  if patella does not reach line = fail, if patella falls within line = equivocal, if patella exceeds line = pass) to normalize terminal stance phase of gait and reduce stress on posterior ankle structures  EVAL: fail      PT LONG TERM GOAL #4   Title Lafreda will improve the following MMTs to >/= 4/5 to show improvement in strength:  R DF  EVAL: 3+/5 in available range      PT LONG TERM GOAL #5   Title Yaresly will achieve 2 degrees knee extension by D/C (see POC end date) to improve stability in stance phase of gait  EVAL: 5 degrees                   Plan - 06/27/21 1432     Clinical Impression Statement Overall, Aerika is progressing well with therapy.  Pt reports no increase in baseline pain following therapy.  Today we concentrated on knee strengthening, hip strengthening, and balance/proprioception.  Pt is challenge with rebounder throws on foam, we will continue to progress higher level balance moving forward.  May add in [place]ussian stim for tib anterior.  Will monitor effect of BFR, but I feel this may continue to be beneficial to avoid irritation of the knee with higher weighted knee ext for now.  Pt will continue to benefit from skilled physical therapy to address remaining deficits and achieve listed goals.  Continue per POC.    PT Treatment/Interventions ADLs/Self Care Home Management;Aquatic Therapy;Iontophoresis 4mg /ml Dexamethasone;Electrical Stimulation;Gait training;Therapeutic activities;Therapeutic exercise;Neuromuscular re-education;Manual techniques;Dry needling;Vasopneumatic Device    PT Next Visit Plan  normalize gait; progress: balance, gait, knee ext ROM; e-stim for tib anterior as needed    PT Home Exercise Plan [id number]             Patient will benefit from skilled therapeutic intervention in order to improve the following deficits and  impairments:  Abnormal gait, Decreased balance, Difficulty walking, Pain, Decreased strength  Visit Diagnosis: Right knee pain, unspecified chronicity  Balance problem  Muscle weakness  Other abnormalities of gait and mobility     Problem List Patient Active Problem List   Diagnosis Date Noted   Impingement syndrome of right shoulder 05/13/2021   Status post total right knee replacement 12/06/2020   Arthritis of  right knee 12/05/2020   Acute tear medial meniscus, right, subsequent encounter 08/16/2020   Hyperlipidemia associated with type 2 diabetes mellitus ([place]) 08/18/2019   Physical exam 02/03/2019   Diabetes mellitus without complication ([place]) 13/64/3837   Morbid obesity ([place]) 01/17/2018   HTN (hypertension) 01/17/2018   Cluster headaches 01/17/2018   Basal cell carcinoma of skin 02/18/2012    Mathis Dad, PT 06/27/2021, 2:34 PM  [facility] [address] [place], [place], [address] Phone: [phone]   Fax:  [phone]  Name: AMANDY CHUBBUCK MRN: [id number] Date of Birth: 17-Mar-1966

## 2021-06-28 ENCOUNTER — Ambulatory Visit: Payer: 59

## 2021-07-01 ENCOUNTER — Encounter: Payer: Self-pay | Admitting: Physical Therapy

## 2021-07-01 ENCOUNTER — Other Ambulatory Visit: Payer: Self-pay

## 2021-07-01 ENCOUNTER — Other Ambulatory Visit: Payer: Self-pay | Admitting: Family Medicine

## 2021-07-01 ENCOUNTER — Other Ambulatory Visit (HOSPITAL_COMMUNITY): Payer: Self-pay

## 2021-07-01 ENCOUNTER — Ambulatory Visit: Payer: 59 | Admitting: Physical Therapy

## 2021-07-01 DIAGNOSIS — R2689 Other abnormalities of gait and mobility: Secondary | ICD-10-CM | POA: Diagnosis not present

## 2021-07-01 DIAGNOSIS — M25561 Pain in right knee: Secondary | ICD-10-CM

## 2021-07-01 DIAGNOSIS — M6281 Muscle weakness (generalized): Secondary | ICD-10-CM | POA: Diagnosis not present

## 2021-07-01 MED ORDER — OMEPRAZOLE 20 MG PO CPDR
DELAYED_RELEASE_CAPSULE | Freq: Every day | ORAL | 1 refills | Status: DC
  Filled 2021-07-01: qty 90, 90d supply, fill #0
  Filled 2021-12-23: qty 90, 90d supply, fill #1

## 2021-07-01 MED ORDER — ATORVASTATIN CALCIUM 10 MG PO TABS
ORAL_TABLET | Freq: Every day | ORAL | 0 refills | Status: DC
  Filled 2021-07-01: qty 90, 90d supply, fill #0

## 2021-07-01 MED ORDER — LISINOPRIL 10 MG PO TABS
ORAL_TABLET | Freq: Every day | ORAL | 1 refills | Status: DC
  Filled 2021-07-01: qty 90, fill #0
  Filled 2021-08-01: qty 90, 90d supply, fill #0
  Filled 2021-09-24 – 2021-10-31 (×2): qty 90, 90d supply, fill #1

## 2021-07-01 NOTE — Therapy (Signed)
[place], [place], [address] Phone: [phone]   Fax:  [phone]  Physical Therapy Treatment  Patient Details  Name: Jamie Hall MRN: [id number] Date of Birth: January 06, 1966 Referring Provider (PT): Pete Pelt, [place]   Encounter Date: 07/01/2021   PT End of Session - 07/01/21 1617     Visit Number 4    Number of Visits 16    Date for PT Re-Evaluation 08/08/21    Authorization Type UMR- FOTO    PT Start Time 2993    PT Stop Time 1657    PT Time Calculation (min) 40 min             Past Medical History:  Diagnosis Date   DM type 2 (diabetes mellitus, type 2) ([place])    followed by pcp   GERD (gastroesophageal reflux disease)    Headache    History of kidney stones    Hypertension    OA (osteoarthritis)    PONV (postoperative nausea and vomiting)    Right knee meniscal tear     Past Surgical History:  Procedure Laterality Date   BREAST REDUCTION SURGERY  2006   CARPECTOMY Left 2007   proximal row, wrist   CYSTOSCOPY/URETEROSCOPY/HOLMIUM LASER/STENT PLACEMENT Left 06/07/2020   Procedure: CYSTOSCOPY/RETROGRADE/URETEROSCOPY/HOLMIUM LASER/STENT PLACEMENT;  Surgeon: Ceasar Mons, MD;  Location: [facility];  Service: Urology;  Laterality: Left;  ONLY NEEDS 45 MIN   GANGLION CYST EXCISION Right 1999   wrist   KIDNEY STONE SURGERY  06/07/2020   KNEE ARTHROSCOPY Right 2000   KNEE ARTHROSCOPY Right 08/16/2020   Procedure: RIGHT KNEE ARTHROSCOPY WITH PARTIAL MEDIAL MENISCECTOMY;  Surgeon: Mcarthur Rossetti, MD;  Location: [facility];  Service: Orthopedics;  Laterality: Right;   TONSILLECTOMY  age 55   TOTAL KNEE ARTHROPLASTY Right 12/06/2020   Procedure: RIGHT TOTAL KNEE ARTHROPLASTY;  Surgeon: Mcarthur Rossetti, MD;  Location: [facility];  Service: Orthopedics;  Laterality: Right;    There were no vitals filed for this visit.   Subjective  Assessment - 07/01/21 1623     Subjective Pt reports that she had some bruising on her proximal thigh following BFR which has now resolved.  She is very sore today after shopping for 4 hours on Sunday.  1-2/10 knee pain constantly today.               Trigger Point Dry Needling - 07/01/21 0001     Consent Given? Yes    Education Handout Provided No    Muscles Treated Lower Quadrant Anterior tibialis    Electrical Stimulation Performed with Dry Needling Yes    E-stim with Dry Needling Details for muscle contraction    Anterior tibialis Response --   palpable muscle activation with e-stim            Modalities:  Vasopneumatic (Game Ready)    Location:  right knee Time:  10 minutes Pressure:  medium Temperature:  40 degrees     [facility] Adult PT Treatment/Exercise:   Therapeutic Exercise: - Rec bike L2 x 5 minutes - contract - relax - contract  - HS and RF stretch (not today) - slant board stretch - 45'' x3 - heel raise on step with eccentric lower - 2x15  - 4'' step up with contralateral march - 2x10 - hip hike on step 3x10   Neuromuscular re-ed, to improve balance and reduce fall risk: - SLS on foam 45'' bouts -  SLS rebounder throws - 4x10     PT Short Term Goals - 06/13/21 1244       PT SHORT TERM GOAL #1   Title Krissia will be >75% HEP compliant to improve carryover between sessions and facilitate independent management of condition    Target Date 07/04/21               PT Long Term Goals - 06/13/21 1245       PT LONG TERM GOAL #1   Title Target date for all long term goals:  08/07/2021      PT LONG TERM GOAL #2   Title Khira will be able to stand for in R SLS stance on unstable surface with repeated 10x L hip flexion, to show a significant improvement in balance in order to reduce fall risk  EVAL: 5x R SLS with L hip flexion before L toe touch      PT LONG TERM GOAL #3   Title Darcy will improve right ankle DF to equivocal category on  FMS ankle DF screen (pt stands in tandem stance and performs closed chain DF of rear foot to maximal DF without lifting heel, categories are measured based on vertical line from forward foot's medial malleolus  if patella does not reach line = fail, if patella falls within line = equivocal, if patella exceeds line = pass) to normalize terminal stance phase of gait and reduce stress on posterior ankle structures  EVAL: fail      PT LONG TERM GOAL #4   Title Sheketa will improve the following MMTs to >/= 4/5 to show improvement in strength:  R DF  EVAL: 3+/5 in available range      PT LONG TERM GOAL #5   Title Marykay will achieve 2 degrees knee extension by D/C (see POC end date) to improve stability in stance phase of gait  EVAL: 5 degrees                   Plan - 07/01/21 1717     Clinical Impression Statement Overall, Donnetta is progressing fair with therapy.  Pt reports no increase in baseline pain following therapy.  Today we concentrated on knee strengthening, balance/proprioception, and activation of anterior tib to improve DF .  Pt tolerated TDN with estim well.  Mod contraction of tib anterior was palpable throughout e-stim.  Pt continues to have pain and swelling following activity over the weekend.  Daphanie will benefit from continued work on balance, quad strengthening, and anterior tibialis strengthening as well as interventions to normalize gait.  Pt will continue to benefit from skilled physical therapy to address remaining deficits and achieve listed goals.  Continue per POC.    PT Treatment/Interventions ADLs/Self Care Home Management;Aquatic Therapy;Iontophoresis 4mg /ml Dexamethasone;Electrical Stimulation;Gait training;Therapeutic activities;Therapeutic exercise;Neuromuscular re-education;Manual techniques;Dry needling;Vasopneumatic Device    PT Next Visit Plan  normalize gait; progress: balance, gait, knee ext ROM; e-stim for tib anterior as needed    PT Home  Exercise Plan [id number]             Patient will benefit from skilled therapeutic intervention in order to improve the following deficits and impairments:  Abnormal gait, Decreased balance, Difficulty walking, Pain, Decreased strength  Visit Diagnosis: Right knee pain, unspecified chronicity  Balance problem  Muscle weakness     Problem List Patient Active Problem List   Diagnosis Date Noted   Impingement syndrome of right shoulder 05/13/2021   Status post total right knee replacement 12/06/2020  Arthritis of right knee 12/05/2020   Acute tear medial meniscus, right, subsequent encounter 08/16/2020   Hyperlipidemia associated with type 2 diabetes mellitus ([place]) 08/18/2019   Physical exam 02/03/2019   Diabetes mellitus without complication ([place]) 86/48/4720   Morbid obesity ([place]) 01/17/2018   HTN (hypertension) 01/17/2018   Cluster headaches 01/17/2018   Basal cell carcinoma of skin 02/18/2012    Mathis Dad, PT 07/01/2021, 5:18 PM  [facility] [address] [place], [place], [address] Phone: [phone]   Fax:  [phone]  Name: JAYRA CHOYCE MRN: [id number] Date of Birth: 01-20-1966

## 2021-07-04 ENCOUNTER — Ambulatory Visit: Payer: 59 | Admitting: Physical Therapy

## 2021-07-04 ENCOUNTER — Other Ambulatory Visit: Payer: Self-pay

## 2021-07-04 ENCOUNTER — Encounter: Payer: Self-pay | Admitting: Physical Therapy

## 2021-07-04 DIAGNOSIS — M6281 Muscle weakness (generalized): Secondary | ICD-10-CM | POA: Diagnosis not present

## 2021-07-04 DIAGNOSIS — M25561 Pain in right knee: Secondary | ICD-10-CM | POA: Diagnosis not present

## 2021-07-04 DIAGNOSIS — R2689 Other abnormalities of gait and mobility: Secondary | ICD-10-CM | POA: Diagnosis not present

## 2021-07-04 NOTE — Therapy (Signed)
[place], [place], [address] Phone: [phone]   Fax:  [phone]  Physical Therapy Treatment  Patient Details  Name: Jamie Hall MRN: [id number] Date of Birth: [birth year] Referring Provider (PT): Pete Pelt, [place]   Encounter Date: 07/04/2021   PT End of Session - 07/04/21 1405     Visit Number 5    Number of Visits 16    Date for PT Re-Evaluation 08/08/21    Authorization Type UMR- FOTO    PT Start Time 1400    PT Stop Time 2229    PT Time Calculation (min) 45 min             Past Medical History:  Diagnosis Date   DM type 2 (diabetes mellitus, type 2) ([place])    followed by pcp   GERD (gastroesophageal reflux disease)    Headache    History of kidney stones    Hypertension    OA (osteoarthritis)    PONV (postoperative nausea and vomiting)    Right knee meniscal tear     Past Surgical History:  Procedure Laterality Date   BREAST REDUCTION SURGERY  2006   CARPECTOMY Left 2007   proximal row, wrist   CYSTOSCOPY/URETEROSCOPY/HOLMIUM LASER/STENT PLACEMENT Left 06/07/2020   Procedure: CYSTOSCOPY/RETROGRADE/URETEROSCOPY/HOLMIUM LASER/STENT PLACEMENT;  Surgeon: Ceasar Mons, MD;  Location: [facility];  Service: Urology;  Laterality: Left;  ONLY NEEDS 45 MIN   GANGLION CYST EXCISION Right 1999   wrist   KIDNEY STONE SURGERY  06/07/2020   KNEE ARTHROSCOPY Right 2000   KNEE ARTHROSCOPY Right 08/16/2020   Procedure: RIGHT KNEE ARTHROSCOPY WITH PARTIAL MEDIAL MENISCECTOMY;  Surgeon: Mcarthur Rossetti, MD;  Location: [facility];  Service: Orthopedics;  Laterality: Right;   TONSILLECTOMY  age 55   TOTAL KNEE ARTHROPLASTY Right 12/06/2020   Procedure: RIGHT TOTAL KNEE ARTHROPLASTY;  Surgeon: Mcarthur Rossetti, MD;  Location: [facility];  Service: Orthopedics;  Laterality: Right;    There were no vitals filed for this visit.   Subjective  Assessment - 07/04/21 1405     Subjective No pain now. 4 hours of shopping cause 3 days of pain and swelling. I am better today.    Currently in Pain? No/denies             Modalities: Ice pack right knee x 10 minutes     [facility] Adult PT Treatment/Exercise:   Therapeutic Exercise: - Rec bike L2 x 6 minutes - contract - relax - contract  - HS and RF stretch (not today) - slant board stretch - 45'' x3 - heel raise on step with eccentric lower - 2x15  - 4'' step up with contralateral march - 2x10 - hip hike on step 2x10- -4 inch lateral  step downs x 20  -seated calf stretch with strap    Neuromuscular re-ed, to improve balance and reduce fall risk: - SLS on foam 45'' bouts- foam oval - SLS rebounder throws - 4x10 foam oval  -tandem stance on airex - rebounder throws    Estim Attended:  [place] Stim- 10sec on / 20 sec off ; 50 bps, 50% duty cycle for 15 minutes -with long sitting right DF         PT Short Term Goals - 06/13/21 1244       PT SHORT TERM GOAL #1   Title Marcellene will be >75% HEP compliant to improve carryover between sessions and facilitate independent management  of condition    Target Date 07/04/21               PT Long Term Goals - 06/13/21 1245       PT LONG TERM GOAL #1   Title Target date for all long term goals:  08/07/2021      PT LONG TERM GOAL #2   Title Aubre will be able to stand for in R SLS stance on unstable surface with repeated 10x L hip flexion, to show a significant improvement in balance in order to reduce fall risk  EVAL: 5x R SLS with L hip flexion before L toe touch      PT LONG TERM GOAL #3   Title Maree will improve right ankle DF to equivocal category on FMS ankle DF screen (pt stands in tandem stance and performs closed chain DF of rear foot to maximal DF without lifting heel, categories are measured based on vertical line from forward foot's medial malleolus  if patella does not reach line = fail, if  patella falls within line = equivocal, if patella exceeds line = pass) to normalize terminal stance phase of gait and reduce stress on posterior ankle structures  EVAL: fail      PT LONG TERM GOAL #4   Title Celenia will improve the following MMTs to >/= 4/5 to show improvement in strength:  R DF  EVAL: 3+/5 in available range      PT LONG TERM GOAL #5   Title Kimmerly will achieve 2 degrees knee extension by D/C (see POC end date) to improve stability in stance phase of gait  EVAL: 5 degrees                   Plan - 07/04/21 1448     Clinical Impression Statement Pt responded well to [place] Stim to tibialis anterior achieving nearly full available ROM of DF in long sitting. Continued with RLE strength, flexibility, and balance. Ice placed to right knee at end of session.    PT Treatment/Interventions ADLs/Self Care Home Management;Aquatic Therapy;Iontophoresis 4mg /ml Dexamethasone;Electrical Stimulation;Gait training;Therapeutic activities;Therapeutic exercise;Neuromuscular re-education;Manual techniques;Dry needling;Vasopneumatic Device    PT Next Visit Plan  normalize gait; progress: balance, gait, knee ext ROM; e-stim for tib anterior as needed    PT Home Exercise Plan [id number]             Patient will benefit from skilled therapeutic intervention in order to improve the following deficits and impairments:  Abnormal gait, Decreased balance, Difficulty walking, Pain, Decreased strength  Visit Diagnosis: Right knee pain, unspecified chronicity  Balance problem  Muscle weakness     Problem List Patient Active Problem List   Diagnosis Date Noted   Impingement syndrome of right shoulder 05/13/2021   Status post total right knee replacement 12/06/2020   Arthritis of right knee 12/05/2020   Acute tear medial meniscus, right, subsequent encounter 08/16/2020   Hyperlipidemia associated with type 2 diabetes mellitus ([place]) 08/18/2019   Physical exam 02/03/2019    Diabetes mellitus without complication ([place]) 85/46/2703   Morbid obesity ([place]) 01/17/2018   HTN (hypertension) 01/17/2018   Cluster headaches 01/17/2018   Basal cell carcinoma of skin 02/18/2012    Dorene Ar, PTA 07/04/2021, 2:55 PM  [place] [facility] [address] [place], [place], [address] Phone: [phone]   Fax:  [phone]  Name: Jamie Hall MRN: [id number] Date of Birth: 1966-02-11

## 2021-07-08 ENCOUNTER — Encounter: Payer: Self-pay | Admitting: Physical Therapy

## 2021-07-08 ENCOUNTER — Ambulatory Visit: Payer: 59 | Admitting: Physical Therapy

## 2021-07-08 ENCOUNTER — Other Ambulatory Visit: Payer: Self-pay

## 2021-07-08 DIAGNOSIS — M25561 Pain in right knee: Secondary | ICD-10-CM

## 2021-07-08 DIAGNOSIS — R2689 Other abnormalities of gait and mobility: Secondary | ICD-10-CM | POA: Diagnosis not present

## 2021-07-08 DIAGNOSIS — M6281 Muscle weakness (generalized): Secondary | ICD-10-CM | POA: Diagnosis not present

## 2021-07-08 NOTE — Therapy (Signed)
Jamie Hall, [place], [address] Phone: [phone]   Fax:  [phone]  Physical Therapy Treatment  Patient Details  Name: Jamie Hall MRN: [id number] Date of Birth: 03-Feb-1966 Referring Provider (PT): Pete Pelt, [place]   Encounter Date: 07/08/2021   PT End of Session - 07/08/21 1608     Visit Number 6    Number of Visits 16    Date for PT Re-Evaluation 08/08/21    Authorization Type UMR- FOTO    PT Start Time 0258    PT Stop Time 1700    PT Time Calculation (min) 50 min             Past Medical History:  Diagnosis Date   DM type 2 (diabetes mellitus, type 2) ([place])    followed by pcp   GERD (gastroesophageal reflux disease)    Headache    History of kidney stones    Hypertension    OA (osteoarthritis)    PONV (postoperative nausea and vomiting)    Right knee meniscal tear     Past Surgical History:  Procedure Laterality Date   BREAST REDUCTION SURGERY  2006   CARPECTOMY Left 2007   proximal row, wrist   CYSTOSCOPY/URETEROSCOPY/HOLMIUM LASER/STENT PLACEMENT Left 06/07/2020   Procedure: CYSTOSCOPY/RETROGRADE/URETEROSCOPY/HOLMIUM LASER/STENT PLACEMENT;  Surgeon: Ceasar Mons, MD;  Location: [facility];  Service: Urology;  Laterality: Left;  ONLY NEEDS 45 MIN   GANGLION CYST EXCISION Right 1999   wrist   KIDNEY STONE SURGERY  06/07/2020   KNEE ARTHROSCOPY Right 2000   KNEE ARTHROSCOPY Right 08/16/2020   Procedure: RIGHT KNEE ARTHROSCOPY WITH PARTIAL MEDIAL MENISCECTOMY;  Surgeon: Mcarthur Rossetti, MD;  Location: [facility];  Service: Orthopedics;  Laterality: Right;   TONSILLECTOMY  age 55   TOTAL KNEE ARTHROPLASTY Right 12/06/2020   Procedure: RIGHT TOTAL KNEE ARTHROPLASTY;  Surgeon: Mcarthur Rossetti, MD;  Location: [facility];  Service: Orthopedics;  Laterality: Right;    There were no vitals filed for this visit.    Subjective Assessment - 07/08/21 1614     Subjective Pt reports that she had a busy weekend and her knee is a little sore and swollen.  She reports 1/10 R knee pain today.              [place] Adult PT Treatment/Exercise:   Therapeutic Exercise: - Rec bike L3 x 6 minutes - slant board stretch - 45'' x3 - R TKA - black TB - 2x20 - Steamboats - 20x ea - RTB - Retro TM walking - 1'x 3 - heel raise on step with eccentric lower - 2x15  - 6'' step up with contralateral march - 2x10 (not today) - hip hike on step 2x10 - (not today) -4 inch lateral  step downs 2 x 20  - knee ext machine 4x5 - 15 lbs - uni    Neuromuscular re-ed, to improve balance and reduce fall risk: - SLS on foam 45'' bouts- foam oval - SLS - rebounder throws 4x10  Ice - 10 min      PT Short Term Goals - 06/13/21 1244       PT SHORT TERM GOAL #1   Title Jamie Hall will be >75% HEP compliant to improve carryover between sessions and facilitate independent management of condition    Target Date 07/04/21               PT Long Term Goals - 06/13/21  Jamie Hall #1   Title Target date for all long term goals:  08/07/2021      PT LONG TERM GOAL #2   Title Jamie Hall will be able to stand for in R SLS stance on unstable surface with repeated 10x L hip flexion, to show a significant improvement in balance in order to reduce fall risk    EVAL: 5x R SLS with L hip flexion before L toe touch      PT LONG TERM GOAL #3   Title Jamie Hall will improve right ankle DF to equivocal category on FMS ankle DF screen (pt stands in tandem stance and performs closed chain DF of rear foot to maximal DF without lifting heel, categories are measured based on vertical line from forward foot's medial malleolus    if patella does not reach line = fail, if patella falls within line = equivocal, if patella exceeds line = pass) to normalize terminal stance phase of gait and reduce stress on posterior ankle structures     EVAL: fail      PT LONG TERM GOAL #4   Title Jamie Hall will improve the following MMTs to >/= 4/5 to show improvement in strength:  R DF    EVAL: 3+/5 in available range      PT LONG TERM GOAL #5   Title Jamie Hall will achieve 2 degrees knee extension by D/C (see POC end date) to improve stability in stance phase of gait    EVAL: 5 degrees                   Plan - 07/08/21 1711     Clinical Impression Statement Overall, Jamie Hall is progressing well with therapy.  Pt reports a mild increase in pain following therapy.  Today we concentrated on knee strengthening, hip strengthening, and balance/proprioception.  Pt shows improved balance with rebounder throws today.  We integrated more exercises emphasizing knee ext today including retro TM walking which were tolerated well.  Pt with R quad strength deficit with knee ext which should be addressed.  Pt will continue to benefit from skilled physical therapy to address remaining deficits and achieve listed goals.  Continue per POC.    PT Treatment/Interventions ADLs/Self Care Home Management;Aquatic Therapy;Iontophoresis 4mg /ml Dexamethasone;Electrical Stimulation;Gait training;Therapeutic activities;Therapeutic exercise;Neuromuscular re-education;Manual techniques;Dry needling;Vasopneumatic Device    PT Next Visit Plan    normalize gait; progress: balance, gait, knee ext ROM; e-stim for tib anterior as needed    PT Home Exercise Plan [id number]             Patient will benefit from skilled therapeutic intervention in order to improve the following deficits and impairments:  Abnormal gait, Decreased balance, Difficulty walking, Pain, Decreased strength  Visit Diagnosis: Right knee pain, unspecified chronicity  Balance problem  Muscle weakness     Problem List Patient Active Problem List   Diagnosis Date Noted   Impingement syndrome of right shoulder 05/13/2021   Status post total right knee replacement 12/06/2020   Arthritis of  right knee 12/05/2020   Acute tear medial meniscus, right, subsequent encounter 08/16/2020   Hyperlipidemia associated with type 2 diabetes mellitus ([place]) 08/18/2019   Physical exam 02/03/2019   Diabetes mellitus without complication ([place]) 32/99/2426   Morbid obesity ([place]) 01/17/2018   HTN (hypertension) 01/17/2018   Cluster headaches 01/17/2018   Basal cell carcinoma of skin 02/18/2012    Mathis Dad, PT 07/08/2021, 5:12 PM  [facility] [address] [place], [place], [address] Phone: [phone]   Fax:  [phone]  Name: Jamie Hall MRN: [id number] Date of Birth: April 22, 1966

## 2021-07-10 ENCOUNTER — Ambulatory Visit: Payer: 59 | Admitting: Physical Therapy

## 2021-07-10 ENCOUNTER — Other Ambulatory Visit: Payer: Self-pay

## 2021-07-10 ENCOUNTER — Encounter: Payer: Self-pay | Admitting: Physical Therapy

## 2021-07-10 DIAGNOSIS — R2689 Other abnormalities of gait and mobility: Secondary | ICD-10-CM | POA: Diagnosis not present

## 2021-07-10 DIAGNOSIS — M6281 Muscle weakness (generalized): Secondary | ICD-10-CM | POA: Diagnosis not present

## 2021-07-10 DIAGNOSIS — M25561 Pain in right knee: Secondary | ICD-10-CM

## 2021-07-10 NOTE — Therapy (Signed)
[place], [place], [address] Phone: [phone]   Fax:  [phone]  Physical Therapy Treatment  Patient Details  Name: Jamie Hall MRN: [id number] Date of Birth: 26-Feb-1966 Referring Provider (PT): Pete Pelt, [place]   Encounter Date: 07/10/2021   PT End of Session - 07/10/21 1530     Visit Number 7    Number of Visits 16    Date for PT Re-Evaluation 08/08/21    Authorization Type UMR- FOTO    PT Start Time 1530    PT Stop Time 1620   ice   PT Time Calculation (min) 50 min             Past Medical History:  Diagnosis Date   DM type 2 (diabetes mellitus, type 2) ([place])    followed by pcp   GERD (gastroesophageal reflux disease)    Headache    History of kidney stones    Hypertension    OA (osteoarthritis)    PONV (postoperative nausea and vomiting)    Right knee meniscal tear     Past Surgical History:  Procedure Laterality Date   BREAST REDUCTION SURGERY  2006   CARPECTOMY Left 2007   proximal row, wrist   CYSTOSCOPY/URETEROSCOPY/HOLMIUM LASER/STENT PLACEMENT Left 06/07/2020   Procedure: CYSTOSCOPY/RETROGRADE/URETEROSCOPY/HOLMIUM LASER/STENT PLACEMENT;  Surgeon: Ceasar Mons, MD;  Location: [facility];  Service: Urology;  Laterality: Left;  ONLY NEEDS 45 MIN   GANGLION CYST EXCISION Right 1999   wrist   KIDNEY STONE SURGERY  06/07/2020   KNEE ARTHROSCOPY Right 2000   KNEE ARTHROSCOPY Right 08/16/2020   Procedure: RIGHT KNEE ARTHROSCOPY WITH PARTIAL MEDIAL MENISCECTOMY;  Surgeon: Mcarthur Rossetti, MD;  Location: [facility];  Service: Orthopedics;  Laterality: Right;   TONSILLECTOMY  age 69   TOTAL KNEE ARTHROPLASTY Right 12/06/2020   Procedure: RIGHT TOTAL KNEE ARTHROPLASTY;  Surgeon: Mcarthur Rossetti, MD;  Location[facility];  Service: Orthopedics;  Laterality: Right;    There were no vitals filed for this visit.    Subjective Assessment - 07/10/21 1534     Subjective Pt reports that her R knee has been more sore over the last several days.  She reports 2-3/10 R knee pain today.              [place] Adult PT Treatment/Exercise:   Therapeutic Exercise: - Rec bike L3 x 6 minutes - slant board stretch - 45'' x3 - R TKA - green pull up band - 2x20 - Steamboats - 20x ea - RTB (NT) - Squat with UE support - 2x10 - Retro TM walking - 1'x 3 - heel raise on step with eccentric lower - 2x15  - 6'' step up with contralateral march - 2x10 (not today) - hip hike on step 2x10 - (not today) - 4 inch lateral  step downs 3x10 (increase next visit) - knee ext machine 4x5 - 15 lbs - uni     Neuromuscular re-ed, to improve balance and reduce fall risk: - SLS on foam 45'' bouts-  with L hip flexion   Ice - 10 min     PT Short Term Goals - 07/10/21 1603       PT SHORT TERM GOAL #1   Title Temara will be >75% HEP compliant to improve carryover between sessions and facilitate independent management of condition    Status Achieved    Target Date 07/04/21  PT Long Term Goals - 06/13/21 1245       PT LONG TERM GOAL #1   Title Target date for all long term goals:  08/07/2021      PT LONG TERM GOAL #2   Title Brielyn will be able to stand for in R SLS stance on unstable surface with repeated 10x L hip flexion, to show a significant improvement in balance in order to reduce fall risk    EVAL: 5x R SLS with L hip flexion before L toe touch      PT LONG TERM GOAL #3   Title Anwar will improve right ankle DF to equivocal category on FMS ankle DF screen (pt stands in tandem stance and performs closed chain DF of rear foot to maximal DF without lifting heel, categories are measured based on vertical line from forward foot's medial malleolus    if patella does not reach line = fail, if patella falls within line = equivocal, if patella exceeds line = pass) to normalize terminal stance phase of  gait and reduce stress on posterior ankle structures    EVAL: fail      PT LONG TERM GOAL #4   Title Yanique will improve the following MMTs to >/= 4/5 to show improvement in strength:  R DF    EVAL: 3+/5 in available range      PT LONG TERM GOAL #5   Title Hillarie will achieve 2 degrees knee extension by D/C (see POC end date) to improve stability in stance phase of gait    EVAL: 5 degrees                   Plan - 07/10/21 1623     Clinical Impression Statement Overall, Zahira is progressing well with therapy.  Pt reports no increase in baseline pain following therapy.  Today we concentrated on knee strengthening, hip strengthening, and balance/proprioception.  Pt continues to have lateral knee pain.  She is gaining more stability in SLS and is able to stand in SLS with contralateral repeated knee flexion today with no LOB.  R quad is still weak compared to L and we will continue to focus on this moving forward.  Pt will continue to benefit from skilled physical therapy to address remaining deficits and achieve listed goals.  Continue per POC.    PT Treatment/Interventions ADLs/Self Care Home Management;Aquatic Therapy;Iontophoresis 4mg /ml Dexamethasone;Electrical Stimulation;Gait training;Therapeutic activities;Therapeutic exercise;Neuromuscular re-education;Manual techniques;Dry needling;Vasopneumatic Device    PT Next Visit Plan    normalize gait; progress: balance, gait, knee ext ROM; e-stim for tib anterior as needed    PT Home Exercise Plan [id number]             Patient will benefit from skilled therapeutic intervention in order to improve the following deficits and impairments:  Abnormal gait, Decreased balance, Difficulty walking, Pain, Decreased strength  Visit Diagnosis: Right knee pain, unspecified chronicity  Balance problem  Other abnormalities of gait and mobility  Muscle weakness     Problem List Patient Active Problem List   Diagnosis Date Noted    Impingement syndrome of right shoulder 05/13/2021   Status post total right knee replacement 12/06/2020   Arthritis of right knee 12/05/2020   Acute tear medial meniscus, right, subsequent encounter 08/16/2020   Hyperlipidemia associated with type 2 diabetes mellitus ([place]) 08/18/2019   Physical exam 02/03/2019   Diabetes mellitus without complication ([place]) 54/65/0354   Morbid obesity ([place]) 01/17/2018   HTN (hypertension) 01/17/2018   Cluster  headaches 01/17/2018   Basal cell carcinoma of skin 02/18/2012    Mathis Dad, PT 07/10/2021, 4:25 PM  [facility] [address] [place], [place], [address] Phone: [phone]   Fax:  [phone]  Name: HULDA REDDIX MRN: [id number] Date of Birth: [birth year]

## 2021-07-15 ENCOUNTER — Encounter: Payer: Self-pay | Admitting: Physical Therapy

## 2021-07-15 ENCOUNTER — Other Ambulatory Visit: Payer: Self-pay

## 2021-07-15 ENCOUNTER — Ambulatory Visit: Payer: 59 | Admitting: Physical Therapy

## 2021-07-15 DIAGNOSIS — M6281 Muscle weakness (generalized): Secondary | ICD-10-CM

## 2021-07-15 DIAGNOSIS — M25561 Pain in right knee: Secondary | ICD-10-CM | POA: Diagnosis not present

## 2021-07-15 DIAGNOSIS — R2689 Other abnormalities of gait and mobility: Secondary | ICD-10-CM

## 2021-07-15 NOTE — Therapy (Signed)
[place], [place], [address] Phone: [phone]   Fax:  [phone]  Physical Therapy Treatment  Patient Details  Name: Jamie Hall MRN: [id number] Date of Birth: 05-25-1966 Referring Provider (PT): Pete Pelt, [place]   Encounter Date: 07/15/2021   PT End of Session - 07/15/21 1615     Visit Number 8    Number of Visits 16    Date for PT Re-Evaluation 08/08/21    Authorization Type UMR- FOTO    PT Start Time 7628    PT Stop Time 1700    PT Time Calculation (min) 45 min             Past Medical History:  Diagnosis Date   DM type 2 (diabetes mellitus, type 2) ([place])    followed by pcp   GERD (gastroesophageal reflux disease)    Headache    History of kidney stones    Hypertension    OA (osteoarthritis)    PONV (postoperative nausea and vomiting)    Right knee meniscal tear     Past Surgical History:  Procedure Laterality Date   BREAST REDUCTION SURGERY  2006   CARPECTOMY Left 2007   proximal row, wrist   CYSTOSCOPY/URETEROSCOPY/HOLMIUM LASER/STENT PLACEMENT Left 06/07/2020   Procedure: CYSTOSCOPY/RETROGRADE/URETEROSCOPY/HOLMIUM LASER/STENT PLACEMENT;  Surgeon: Ceasar Mons, MD;  Location: [facility];  Service: Urology;  Laterality: Left;  ONLY NEEDS 45 MIN   GANGLION CYST EXCISION Right 1999   wrist   KIDNEY STONE SURGERY  06/07/2020   KNEE ARTHROSCOPY Right 2000   KNEE ARTHROSCOPY Right 08/16/2020   Procedure: RIGHT KNEE ARTHROSCOPY WITH PARTIAL MEDIAL MENISCECTOMY;  Surgeon: Mcarthur Rossetti, MD;  Location: [place];  Service: Orthopedics;  Laterality: Right;   TONSILLECTOMY  age 55   TOTAL KNEE ARTHROPLASTY Right 12/06/2020   Procedure: RIGHT TOTAL KNEE ARTHROPLASTY;  Surgeon: Mcarthur Rossetti, MD;  Location: [facility];  Service: Orthopedics;  Laterality: Right;    There were no vitals filed for this visit.    Subjective Assessment - 07/15/21 1618     Subjective Pt reports that she had a busy weekend.  Her knee was vey painful Sunday.  She sees little change since starting therapy. 1-2/10 R knee pain today, the pain can reach 7-8/10 several times a week when moving from flexion to ext.             [place] Adult PT Treatment/Exercise:   Therapeutic Exercise: - Rec bike L3 x 6 minutes - slant board stretch - 45'' x3 - R TKA - green pull up band - 2x20 (NT) - Steamboats - 20x ea - RTB (NT) - Squat with UE support - 3x10 - Retro TM walking - 1'x 3 - heel raise on step with eccentric lower - 2x15 (not today) - hip hike on step 3x10  - 4 inch lateral  step downs 3x10  - knee ext machine 5x5 - 15 lbs - uni - knee flexion machine - uni - 3x10 - leg press - 3x10 @ 40#     Neuromuscular re-ed, to improve balance and reduce fall risk: - SLS on foam 45'' bouts-  with L hip flexion - SLS rebounder throws - 3x10  - S/L PF/DF - 2x20   Ice - 10 min    PT Short Term Goals - 07/10/21 1603       PT SHORT TERM GOAL #1   Title Jamie Hall will be >75%  HEP compliant to improve carryover between sessions and facilitate independent management of condition    Status Achieved    Target Date 07/04/21               PT Long Term Goals - 06/13/21 1245       PT LONG TERM GOAL #1   Title Target date for all long term goals:  08/07/2021      PT LONG TERM GOAL #2   Title Jamie Hall will be able to stand for in R SLS stance on unstable surface with repeated 10x L hip flexion, to show a significant improvement in balance in order to reduce fall risk    EVAL: 5x R SLS with L hip flexion before L toe touch      PT LONG TERM GOAL #3   Title Jamie Hall will improve right ankle DF to equivocal category on FMS ankle DF screen (pt stands in tandem stance and performs closed chain DF of rear foot to maximal DF without lifting heel, categories are measured based on vertical line from forward foot's medial malleolus    if  patella does not reach line = fail, if patella falls within line = equivocal, if patella exceeds line = pass) to normalize terminal stance phase of gait and reduce stress on posterior ankle structures    EVAL: fail      PT LONG TERM GOAL #4   Title Jamie Hall will improve the following MMTs to >/= 4/5 to show improvement in strength:  R DF    EVAL: 3+/5 in available range      PT LONG TERM GOAL #5   Title Jamie Hall will achieve 2 degrees knee extension by D/C (see POC end date) to improve stability in stance phase of gait    EVAL: 5 degrees                   Plan - 07/15/21 1700     Clinical Impression Statement Jamie Hall is progressing fair with therapy.  Pt reports a mild increase in pain following therapy.  Today we concentrated on quad strengthening and hip strengthening.  Pt continues to progress strength of R knee.  A clear strength deficit remains with knee ext of R leg vs L.  She continues to endorse pain over the anterolateral aspect of the knee with deep knee flexion.  Pt will continue to benefit from skilled physical therapy to address remaining deficits and achieve listed goals.  Continue per POC.    PT Treatment/Interventions ADLs/Self Care Home Management;Aquatic Therapy;Iontophoresis 4mg /ml Dexamethasone;Electrical Stimulation;Gait training;Therapeutic activities;Therapeutic exercise;Neuromuscular re-education;Manual techniques;Dry needling;Vasopneumatic Device    PT Next Visit Plan    normalize gait; progress: balance, gait, knee ext ROM; e-stim for tib anterior as needed    PT Home Exercise Plan Jamie Hall             Patient will benefit from skilled therapeutic intervention in order to improve the following deficits and impairments:  Abnormal gait, Decreased balance, Difficulty walking, Pain, Decreased strength  Visit Diagnosis: Right knee pain, unspecified chronicity  Balance problem  Other abnormalities of gait and mobility  Muscle weakness     Problem  List Patient Active Problem List   Diagnosis Date Noted   Impingement syndrome of right shoulder 05/13/2021   Status post total right knee replacement 12/06/2020   Arthritis of right knee 12/05/2020   Acute tear medial meniscus, right, subsequent encounter 08/16/2020   Hyperlipidemia associated with type 2 diabetes mellitus ([place]) 08/18/2019  Physical exam 02/03/2019   Diabetes mellitus without complication ([place]) 15/72/6203   Morbid obesity ([place]) 01/17/2018   HTN (hypertension) 01/17/2018   Cluster headaches 01/17/2018   Basal cell carcinoma of skin 02/18/2012    Mathis Dad, PT 07/15/2021, 5:01 PM  [facility] [address] [place], [place], [address] Phone: [phone]   Fax:  [phone]  Name: Jamie Hall MRN: [id number] Date of Birth: 09/04/1965

## 2021-07-17 ENCOUNTER — Encounter: Payer: Self-pay | Admitting: Physical Therapy

## 2021-07-17 ENCOUNTER — Ambulatory Visit: Payer: 59 | Admitting: Physical Therapy

## 2021-07-17 ENCOUNTER — Other Ambulatory Visit: Payer: Self-pay

## 2021-07-17 DIAGNOSIS — R2689 Other abnormalities of gait and mobility: Secondary | ICD-10-CM | POA: Diagnosis not present

## 2021-07-17 DIAGNOSIS — M6281 Muscle weakness (generalized): Secondary | ICD-10-CM

## 2021-07-17 DIAGNOSIS — M25561 Pain in right knee: Secondary | ICD-10-CM | POA: Diagnosis not present

## 2021-07-17 NOTE — Therapy (Addendum)
[place], [place], [address] Phone: [phone]   Fax:  [phone]  PHYSICAL THERAPY UNPLANNED DISCHARGE SUMMARY   Visits from Start of Care: 9  Current functional level related to goals / functional outcomes: See goals section  Remaining deficits: Current status unknown   Education / Equipment: Pt has not returned since visit listed below  Patient goals were not assessed. Patient is being discharged due to not returning since the last visit.  (the below note was addended to include the above D/C summary on 08/06/21)   Physical Therapy Treatment  Patient Details  Name: Jamie Hall MRN: [id number] Date of Birth: [birth year] Referring Provider (PT): Pete Pelt, [place]   Encounter Date: 07/17/2021   PT End of Session - 07/17/21 1614     Visit Number 9    Number of Visits 16    Date for PT Re-Evaluation 08/08/21    Authorization Type UMR- FOTO    PT Start Time 0165    PT Stop Time 1700    PT Time Calculation (min) 45 min             Past Medical History:  Diagnosis Date   DM type 2 (diabetes mellitus, type 2) ([place])    followed by pcp   GERD (gastroesophageal reflux disease)    Headache    History of kidney stones    Hypertension    OA (osteoarthritis)    PONV (postoperative nausea and vomiting)    Right knee meniscal tear     Past Surgical History:  Procedure Laterality Date   BREAST REDUCTION SURGERY  2006   CARPECTOMY Left 2007   proximal row, wrist   CYSTOSCOPY/URETEROSCOPY/HOLMIUM LASER/STENT PLACEMENT Left 06/07/2020   Procedure: CYSTOSCOPY/RETROGRADE/URETEROSCOPY/HOLMIUM LASER/STENT PLACEMENT;  Surgeon: Ceasar Mons, MD;  Location: [facility];  Service: Urology;  Laterality: Left;  ONLY NEEDS 45 MIN   GANGLION CYST EXCISION Right 1999   wrist   KIDNEY STONE SURGERY  06/07/2020   KNEE ARTHROSCOPY Right 2000   KNEE ARTHROSCOPY Right  08/16/2020   Procedure: RIGHT KNEE ARTHROSCOPY WITH PARTIAL MEDIAL MENISCECTOMY;  Surgeon: Mcarthur Rossetti, MD;  Location: [place];  Service: Orthopedics;  Laterality: Right;   TONSILLECTOMY  age 55   TOTAL KNEE ARTHROPLASTY Right 12/06/2020   Procedure: RIGHT TOTAL KNEE ARTHROPLASTY;  Surgeon: Mcarthur Rossetti, MD;  Location: [facility];  Service: Orthopedics;  Laterality: Right;    There were no vitals filed for this visit.   Subjective Assessment - 07/17/21 1618     Subjective Pt reports that her knee has been sore yesterday and today.  She has seen no significant improvement in her R knee pain. 1-2/10 R knee pain today, the pain can reach 7-8/10 several times a week when moving from flexion to ext.              Objective:  R DF FMS screen "equivocal"  R SLS on foam with repeated hip flexion (able to complete 15x)  R DF strength: 3+/5   [facility] Adult PT Treatment/Exercise:   Therapeutic Exercise: - Rec bike L3 x 5 minutes - slant board stretch - 45'' x3 - Squat with UE support - 3x10 - Retro TM walking - 1'x 3 - heel raise on step with eccentric lower - 3x10 - hip hike on step 3x15  - knee ext machine 3x10 - 15 lbs - uni   Neuromuscular re-ed, to improve balance and  reduce fall risk: - SLS on foam - with L hip flexion - S/L PF/DF - 2x20   Modalities:  Vasopneumatic (Game Ready)    Location:  right knee Time:  15 minutes Pressure:  low Temperature:  36 degrees     PT Short Term Goals - 07/10/21 1603       PT SHORT TERM GOAL #1   Title Jamie Hall will be >75% HEP compliant to improve carryover between sessions and facilitate independent management of condition    Status Achieved    Target Date 07/04/21               PT Long Term Goals - 07/17/21 1633       PT LONG TERM GOAL #1   Title Target date for all long term goals:  08/07/2021      PT LONG TERM GOAL #2   Title Jamie Hall will be able to stand for in R SLS stance on  unstable surface with repeated 10x L hip flexion, to show a significant improvement in balance in order to reduce fall risk    EVAL: 5x R SLS with L hip flexion before L toe touch    Status Achieved      PT LONG TERM GOAL #3   Title Jamie Hall will improve right ankle DF to equivocal category on FMS ankle DF screen (pt stands in tandem stance and performs closed chain DF of rear foot to maximal DF without lifting heel, categories are measured based on vertical line from forward foot's medial malleolus    if patella does not reach line = fail, if patella falls within line = equivocal, if patella exceeds line = pass) to normalize terminal stance phase of gait and reduce stress on posterior ankle structures    EVAL: fail    Status Achieved      PT LONG TERM GOAL #4   Title Jamie Hall will improve the following MMTs to >/= 4/5 to show improvement in strength:  R DF    EVAL: 3+/5 in available range    Baseline 12/22: 3+/5    Status On-going      PT LONG TERM GOAL #5   Title Jamie Hall will achieve 2 degrees knee extension by D/C (see POC end date) to improve stability in stance phase of gait    EVAL: 5 degrees    Status Deferred                   Plan - 07/17/21 1654     Clinical Impression Statement Jamie Hall is progressing fair with therapy.  Pt reports no increase in baseline pain following therapy.  Today we concentrated on quad strengthening and balance/proprioception.  Pt with improved DF ROM of R ankle today as well as improved SLS balance on unstable surface.  She has improved DF strength functionally, but is still quite weak.  She continues to show knee ext strength deficit compared to L; this is improving.  She will return to MD next week and decide on plan to continue..  Pt will continue to benefit from skilled physical therapy to address remaining deficits and achieve listed goals.  Continue per POC.    PT Treatment/Interventions ADLs/Self Care Home Management;Aquatic Therapy;Iontophoresis  4mg /ml Dexamethasone;Electrical Stimulation;Gait training;Therapeutic activities;Therapeutic exercise;Neuromuscular re-education;Manual techniques;Dry needling;Vasopneumatic Device    PT Next Visit Plan    normalize gait; progress: balance, gait, knee ext ROM; e-stim for tib anterior as needed    Jamie Hall  Patient will benefit from skilled therapeutic intervention in order to improve the following deficits and impairments:  Abnormal gait, Decreased balance, Difficulty walking, Pain, Decreased strength  Visit Diagnosis: Right knee pain, unspecified chronicity  Balance problem  Other abnormalities of gait and mobility  Muscle weakness     Problem List Patient Active Problem List   Diagnosis Date Noted   Impingement syndrome of right shoulder 05/13/2021   Status post total right knee replacement 12/06/2020   Arthritis of right knee 12/05/2020   Acute tear medial meniscus, right, subsequent encounter 08/16/2020   Hyperlipidemia associated with type 2 diabetes mellitus ([place]) 08/18/2019   Physical exam 02/03/2019   Diabetes mellitus without complication ([place]) 81/15/7262   Morbid obesity ([place]) 01/17/2018   HTN (hypertension) 01/17/2018   Cluster headaches 01/17/2018   Basal cell carcinoma of skin 02/18/2012    Mathis Dad, PT 07/17/2021, 4:57 PM  [facility] [address] [place], [place], [address] Phone: [phone]   Fax:  [phone]  Name: Jamie Hall MRN: [id number] Date of Birth: Jun 28, 1966

## 2021-07-22 ENCOUNTER — Encounter: Payer: Self-pay | Admitting: Orthopaedic Surgery

## 2021-07-22 ENCOUNTER — Other Ambulatory Visit: Payer: Self-pay

## 2021-07-22 ENCOUNTER — Telehealth: Payer: Self-pay | Admitting: Orthopaedic Surgery

## 2021-07-22 ENCOUNTER — Ambulatory Visit (INDEPENDENT_AMBULATORY_CARE_PROVIDER_SITE_OTHER): Payer: 59 | Admitting: Orthopaedic Surgery

## 2021-07-22 DIAGNOSIS — Z96651 Presence of right artificial knee joint: Secondary | ICD-10-CM

## 2021-07-22 NOTE — Progress Notes (Signed)
[name] is now 7 months status post a right total knee arthroplasty.  She still has a sharp pain on the lateral aspect of her knee and it is at the level of the knee joint and the IT band on the right lateral side.  This is been the most frustrating thing for her.  She does report improved strength and range of motion of her knee.  On exam her foot drop is almost completely resolved now on the right side and she is not needing to wear an AFO.  On exam her right knee shows just mild swelling to be expected.  She has excellent range of motion of the right knee and it feels ligamentously stable.  She does have sharp lateral pain over the IT band area of the left knee and numbness in that area as well.  She is requesting an MRI of the right knee and I think this is certainly reasonable given the amount of lateral pain she is having so we can look for any pathology as it relates to her knee replacement on the lateral side.  Also wanted to try hopefully diagnostic and therapeutic steroid injection over that side of her knee and she agrees with this and did tolerate it well.  We will see her back in follow-up after having an MRI of her right knee and to see how the injection is done for her clinically.

## 2021-07-22 NOTE — Telephone Encounter (Signed)
Pt wants to make sure her MRI referral is sent to [facility], please and thank you.

## 2021-07-22 NOTE — Telephone Encounter (Signed)
This information was given to [facility], Dr. Trevor Mace assistant. She is going to be sure that is placed in the order.

## 2021-07-25 ENCOUNTER — Ambulatory Visit (HOSPITAL_COMMUNITY)
Admission: RE | Admit: 2021-07-25 | Discharge: 2021-07-25 | Disposition: A | Discharge status: Home / Self Care | Payer: 59 | Source: Ambulatory Visit | Attending: Orthopaedic Surgery | Admitting: Orthopaedic Surgery

## 2021-07-25 ENCOUNTER — Other Ambulatory Visit: Payer: Self-pay

## 2021-07-25 DIAGNOSIS — M25561 Pain in right knee: Secondary | ICD-10-CM | POA: Diagnosis not present

## 2021-07-25 DIAGNOSIS — Z471 Aftercare following joint replacement surgery: Secondary | ICD-10-CM | POA: Diagnosis not present

## 2021-07-25 DIAGNOSIS — Z96651 Presence of right artificial knee joint: Secondary | ICD-10-CM | POA: Insufficient documentation

## 2021-07-25 DIAGNOSIS — R600 Localized edema: Secondary | ICD-10-CM | POA: Diagnosis not present

## 2021-07-25 DIAGNOSIS — M25461 Effusion, right knee: Secondary | ICD-10-CM | POA: Diagnosis not present

## 2021-07-31 ENCOUNTER — Ambulatory Visit (INDEPENDENT_AMBULATORY_CARE_PROVIDER_SITE_OTHER): Payer: 59 | Admitting: Family Medicine

## 2021-07-31 ENCOUNTER — Encounter: Payer: Self-pay | Admitting: Family Medicine

## 2021-07-31 DIAGNOSIS — E559 Vitamin D deficiency, unspecified: Secondary | ICD-10-CM

## 2021-07-31 DIAGNOSIS — E119 Type 2 diabetes mellitus without complications: Secondary | ICD-10-CM

## 2021-07-31 DIAGNOSIS — Z114 Encounter for screening for human immunodeficiency virus [HIV]: Secondary | ICD-10-CM | POA: Diagnosis not present

## 2021-07-31 DIAGNOSIS — Z1159 Encounter for screening for other viral diseases: Secondary | ICD-10-CM | POA: Diagnosis not present

## 2021-07-31 DIAGNOSIS — E785 Hyperlipidemia, unspecified: Secondary | ICD-10-CM | POA: Diagnosis not present

## 2021-07-31 DIAGNOSIS — Z0000 Encounter for general adult medical examination without abnormal findings: Secondary | ICD-10-CM

## 2021-07-31 DIAGNOSIS — E1169 Type 2 diabetes mellitus with other specified complication: Secondary | ICD-10-CM | POA: Diagnosis not present

## 2021-07-31 LAB — LIPID PANEL
Cholesterol: 136 mg/dL (ref 0–200)
HDL: 46.8 mg/dL (ref >39.00)
LDL Cholesterol: 76 mg/dL (ref 0–99)
NonHDL: 89.47
Total CHOL/HDL Ratio: 3
Triglycerides: 65 mg/dL (ref 0.0–149.0)
VLDL: 13 mg/dL (ref 0.0–40.0)

## 2021-07-31 LAB — CBC WITH DIFFERENTIAL/PLATELET
Basophils Absolute: 0.2 10*3/uL — ABNORMAL HIGH (ref 0.0–0.1)
Basophils Relative: 1.5 % (ref 0.0–3.0)
Eosinophils Absolute: 0.3 10*3/uL (ref 0.0–0.7)
Eosinophils Relative: 2.5 % (ref 0.0–5.0)
HCT: 39.4 % (ref 36.0–46.0)
Hemoglobin: 12.6 g/dL (ref 12.0–15.0)
Lymphocytes Relative: 20.5 % (ref 12.0–46.0)
Lymphs Abs: 2.4 10*3/uL (ref 0.7–4.0)
MCHC: 31.9 g/dL (ref 30.0–36.0)
MCV: 85.3 fl (ref 78.0–100.0)
Monocytes Absolute: 0.8 10*3/uL (ref 0.1–1.0)
Monocytes Relative: 6.6 % (ref 3.0–12.0)
Neutro Abs: 8.2 10*3/uL — ABNORMAL HIGH (ref 1.4–7.7)
Neutrophils Relative %: 68.9 % (ref 43.0–77.0)
Platelets: 352 10*3/uL (ref 150.0–400.0)
RBC: 4.63 Mil/uL (ref 3.87–5.11)
RDW: 15.3 % (ref 11.5–15.5)
WBC: 11.9 10*3/uL — ABNORMAL HIGH (ref 4.0–10.5)

## 2021-07-31 LAB — BASIC METABOLIC PANEL
BUN: 15 mg/dL (ref 6–23)
CO2: 31 mEq/L (ref 19–32)
Calcium: 9.4 mg/dL (ref 8.4–10.5)
Chloride: 101 mEq/L (ref 96–112)
Creatinine, Ser: 0.75 mg/dL (ref 0.40–1.20)
GFR: 90.8 mL/min (ref >60.00)
Glucose, Bld: 115 mg/dL — ABNORMAL HIGH (ref 70–99)
Potassium: 4.2 mEq/L (ref 3.5–5.1)
Sodium: 138 mEq/L (ref 135–145)

## 2021-07-31 LAB — HEPATIC FUNCTION PANEL
ALT: 18 U/L (ref 0–35)
AST: 11 U/L (ref 0–37)
Albumin: 4 g/dL (ref 3.5–5.2)
Alkaline Phosphatase: 87 U/L (ref 39–117)
Bilirubin, Direct: 0.1 mg/dL (ref 0.0–0.3)
Total Bilirubin: 0.4 mg/dL (ref 0.2–1.2)
Total Protein: 6.8 g/dL (ref 6.0–8.3)

## 2021-07-31 LAB — HEMOGLOBIN A1C: Hgb A1c MFr Bld: 7.6 % — ABNORMAL HIGH (ref 4.6–6.5)

## 2021-07-31 LAB — TSH: TSH: 1.34 u[IU]/mL (ref 0.35–5.50)

## 2021-07-31 LAB — VITAMIN D 25 HYDROXY (VIT D DEFICIENCY, FRACTURES): VITD: 21.57 ng/mL — ABNORMAL LOW (ref 30.00–100.00)

## 2021-07-31 NOTE — Patient Instructions (Addendum)
Follow up in 3-4 months to recheck diabetes We'll notify you of your lab results and make any changes if needed Have the eye doctor send me a copy of their report Continue to work on low carb diet and regular exercise- you can do it!! Call with any questions or concerns Stay Safe!  Stay Healthy! Happy New Year!!!

## 2021-07-31 NOTE — Assessment & Plan Note (Signed)
Ongoing issue for pt.  Admits that she has not been eating well and has had 3 cortisone injxns.  Eye exam scheduled.  Foot exam done today.  On ACE for renal protection.  Check labs.  Adjust meds prn.

## 2021-07-31 NOTE — Assessment & Plan Note (Signed)
Pt has gained back all the weight that she lost.  Admits to eating poorly over the holidays.  Encouraged her to get back on track w/ low carb diet and regular exercise.  Will follow.

## 2021-07-31 NOTE — Assessment & Plan Note (Signed)
Chronic problem.  Check labs.  Adjust meds prn  

## 2021-07-31 NOTE — Progress Notes (Signed)
° °  Subjective:    Patient ID: Jamie Hall, female    DOB: [birth year], 56 y.o.   MRN: [id number]  HPI CPE- UTD on pap, mammo, colonoscopy.  Eye exam is due this month.  Foot exam due today.  Patient Care Team    Relationship Specialty Notifications Start End  Midge Minium, MD PCP - General Family Medicine  01/17/18   Key, Nelia Shi, NP Nurse Practitioner Gynecology  02/03/19      Health Maintenance  Topic Date Due   Pneumococcal Vaccine 109-54 Years old (1 - PCV) Never done   HIV Screening  Never done   Hepatitis C Screening  Never done   Zoster Vaccines- Shingrix (1 of 2) Never done   COVID-19 Vaccine (4 - Booster for Pfizer series) 07/10/2020   TETANUS/TDAP  07/31/2022 (Originally 01/07/1987)   OPHTHALMOLOGY EXAM  08/07/2021   HEMOGLOBIN A1C  08/14/2021   FOOT EXAM  07/31/2022   MAMMOGRAM  08/20/2022   PAP SMEAR-Modifier  08/22/2023   COLONOSCOPY (Pts 45-80yrs Insurance coverage will need to be confirmed)  05/16/2026   INFLUENZA VACCINE  Completed   HPV VACCINES  Aged Out     Review of Systems Patient reports no vision/ hearing changes, adenopathy,fever, weight change,  persistant/recurrent hoarseness , swallowing issues, chest pain, palpitations, edema, persistant/recurrent cough, hemoptysis, dyspnea (rest/exertional/paroxysmal nocturnal), gastrointestinal bleeding (melena, rectal bleeding), abdominal pain, significant heartburn, bowel changes, GU symptoms (dysuria, hematuria, incontinence), Gyn symptoms (abnormal  bleeding, pain),  syncope, focal weakness, memory loss, numbness & tingling, skin/hair/nail changes, abnormal bruising or bleeding, anxiety, or depression.   This visit occurred during the SARS-CoV-2 public health emergency.  Safety protocols were in place, including screening questions prior to the visit, additional usage of staff PPE, and extensive cleaning of exam room while observing appropriate contact time as indicated for disinfecting solutions.       Objective:   Physical Exam General Appearance:    Alert, cooperative, no distress, appears stated age, obese  Head:    Normocephalic, without obvious abnormality, atraumatic  Eyes:    PERRL, conjunctiva/corneas clear, EOM's intact, fundi    benign, both eyes  Ears:    Normal TM's and external ear canals, both ears  Nose:   Deferred due to COVID  Throat:   Neck:   Supple, symmetrical, trachea midline, no adenopathy;    Thyroid: no enlargement/tenderness/nodules  Back:     Symmetric, no curvature, ROM normal, no CVA tenderness  Lungs:     Clear to auscultation bilaterally, respirations unlabored  Chest Wall:    No tenderness or deformity   Heart:    Regular rate and rhythm, S1 and S2 normal, no murmur, rub   or gallop  Breast Exam:    Deferred to GYN  Abdomen:     Soft, non-tender, bowel sounds active all four quadrants,    no masses, no organomegaly  Genitalia:    Deferred to GYN  Rectal:    Extremities:   Extremities normal, atraumatic, no cyanosis or edema  Pulses:   2+ and symmetric all extremities  Skin:   Skin color, texture, turgor normal, no rashes or lesions  Lymph nodes:   Cervical, supraclavicular, and axillary nodes normal  Neurologic:   CNII-XII intact, normal strength, sensation and reflexes    throughout          Assessment & Plan:

## 2021-08-01 ENCOUNTER — Other Ambulatory Visit: Payer: Self-pay | Admitting: Family Medicine

## 2021-08-01 ENCOUNTER — Other Ambulatory Visit (HOSPITAL_COMMUNITY): Payer: Self-pay

## 2021-08-01 ENCOUNTER — Telehealth: Payer: Self-pay

## 2021-08-01 DIAGNOSIS — E559 Vitamin D deficiency, unspecified: Secondary | ICD-10-CM

## 2021-08-01 LAB — HEPATITIS C ANTIBODY
Hepatitis C Ab: NONREACTIVE
SIGNAL TO CUT-OFF: 0.14 (ref <1.00)

## 2021-08-01 LAB — HIV ANTIBODY (ROUTINE TESTING W REFLEX): HIV 1&2 Ab, 4th Generation: NONREACTIVE

## 2021-08-01 MED ORDER — VITAMIN D (ERGOCALCIFEROL) 1.25 MG (50000 UNIT) PO CAPS
50000.0000 [IU] | ORAL_CAPSULE | ORAL | 0 refills | Status: DC
  Filled 2021-08-01: qty 12, 84d supply, fill #0

## 2021-08-01 MED ORDER — OZEMPIC (0.25 OR 0.5 MG/DOSE) 2 MG/1.5ML ~~LOC~~ SOPN
0.2500 mg | PEN_INJECTOR | SUBCUTANEOUS | 1 refills | Status: DC
  Filled 2021-08-01: qty 1.5, 56d supply, fill #0
  Filled 2021-09-20: qty 1.5, 56d supply, fill #1

## 2021-08-01 NOTE — Telephone Encounter (Signed)
-----   Message from Midge Minium, MD sent at 08/01/2021  7:36 AM EST ----- Labs look good w/ exception of low Vit D.  Based on this, we need to start prescription 50,000 units weekly x12 weeks in addition to daily OTC supplement of at least 2000 units.   Also, A1C has increased from 7.2 --> 7.6  This isn't as bad as you thought, but I still think you would benefit from starting an injectable like Ozempic.  I'll send this to the pharmacy as an add on to the current regimen (and it will help w/ weight loss)

## 2021-08-01 NOTE — Telephone Encounter (Signed)
Patient aware of labs and medications

## 2021-08-19 ENCOUNTER — Other Ambulatory Visit (HOSPITAL_COMMUNITY): Payer: Self-pay

## 2021-08-19 ENCOUNTER — Encounter: Payer: Self-pay | Admitting: Orthopaedic Surgery

## 2021-08-19 ENCOUNTER — Ambulatory Visit (INDEPENDENT_AMBULATORY_CARE_PROVIDER_SITE_OTHER): Payer: 59 | Admitting: Orthopaedic Surgery

## 2021-08-19 DIAGNOSIS — Z96651 Presence of right artificial knee joint: Secondary | ICD-10-CM

## 2021-08-19 MED ORDER — LIDOCAINE 5 % EX PTCH
1.0000 | MEDICATED_PATCH | CUTANEOUS | 0 refills | Status: DC
  Filled 2021-08-19: qty 30, 30d supply, fill #0

## 2021-08-19 MED ORDER — MELOXICAM 15 MG PO TABS
15.0000 mg | ORAL_TABLET | Freq: Every day | ORAL | 3 refills | Status: DC
  Filled 2021-08-19: qty 30, 30d supply, fill #0
  Filled 2021-09-09 – 2021-09-12 (×2): qty 30, 30d supply, fill #1
  Filled 2021-10-16: qty 30, 30d supply, fill #2
  Filled 2021-11-14: qty 30, 30d supply, fill #3

## 2021-08-19 NOTE — Progress Notes (Signed)
Jamie Hall is now 8 months status post a right total knee arthroplasty.  She is done excellent in terms of getting her motion back.  Her foot drop that was there postoperatively is continuing to improve significantly.  Her biggest complaint is lateral joint line tenderness.  This is been around the IT band and lateral joint line.  We did send her for an MRI to make sure that there was nothing worrisome feature was around the lateral aspect of her knee.  I did review the MRI and reviewed with radiologist as well and there is no worrisome features.  On exam today again her range of motion is full.  I tried aspirating fluid in the knee and this was negative.  There is no place in the knee in terms of the drawer sign.  It slightly opens up laterally with varus valgus stressing but this is only slight.  I do feel that we should give this more time for strengthening.  I will see her back in 4 months with a repeat AP and lateral of her right knee.  I will try Lidoderm patches and she does not take anti-inflammatories we will put her on meloxicam daily.  All question concerns were answered addressed.  I agree with her that I would like to see her feel better.  She states coming down some steps even in the hospital recently because significant pain after she got halfway down the steps.  Of note there has been no evidence of loosening.  She has press-fit implants.

## 2021-08-26 DIAGNOSIS — Z130 Encounter for screening for diseases of the blood and blood-forming organs and certain disorders involving the immune mechanism: Secondary | ICD-10-CM | POA: Diagnosis not present

## 2021-08-26 DIAGNOSIS — Z01419 Encounter for gynecological examination (general) (routine) without abnormal findings: Secondary | ICD-10-CM | POA: Diagnosis not present

## 2021-08-26 DIAGNOSIS — Z6841 Body Mass Index (BMI) 40.0 and over, adult: Secondary | ICD-10-CM | POA: Diagnosis not present

## 2021-08-26 DIAGNOSIS — Z30431 Encounter for routine checking of intrauterine contraceptive device: Secondary | ICD-10-CM | POA: Diagnosis not present

## 2021-08-26 DIAGNOSIS — Z1231 Encounter for screening mammogram for malignant neoplasm of breast: Secondary | ICD-10-CM | POA: Diagnosis not present

## 2021-08-26 DIAGNOSIS — Z1389 Encounter for screening for other disorder: Secondary | ICD-10-CM | POA: Diagnosis not present

## 2021-08-28 DIAGNOSIS — R8271 Bacteriuria: Secondary | ICD-10-CM | POA: Diagnosis not present

## 2021-08-28 DIAGNOSIS — N201 Calculus of ureter: Secondary | ICD-10-CM | POA: Diagnosis not present

## 2021-08-28 DIAGNOSIS — N281 Cyst of kidney, acquired: Secondary | ICD-10-CM | POA: Diagnosis not present

## 2021-09-09 ENCOUNTER — Other Ambulatory Visit (HOSPITAL_COMMUNITY): Payer: Self-pay

## 2021-09-09 ENCOUNTER — Other Ambulatory Visit: Payer: Self-pay | Admitting: Family Medicine

## 2021-09-09 MED ORDER — METFORMIN HCL 500 MG PO TABS
ORAL_TABLET | Freq: Two times a day (BID) | ORAL | 1 refills | Status: DC
  Filled 2021-09-09: qty 180, 90d supply, fill #0

## 2021-09-12 ENCOUNTER — Other Ambulatory Visit (HOSPITAL_COMMUNITY): Payer: Self-pay

## 2021-09-20 ENCOUNTER — Other Ambulatory Visit (HOSPITAL_COMMUNITY): Payer: Self-pay

## 2021-09-24 ENCOUNTER — Other Ambulatory Visit (HOSPITAL_COMMUNITY): Payer: Self-pay

## 2021-09-25 ENCOUNTER — Other Ambulatory Visit (HOSPITAL_COMMUNITY): Payer: Self-pay

## 2021-09-25 ENCOUNTER — Other Ambulatory Visit: Payer: Self-pay | Admitting: Family Medicine

## 2021-09-25 MED ORDER — ATORVASTATIN CALCIUM 10 MG PO TABS
ORAL_TABLET | Freq: Every day | ORAL | 0 refills | Status: DC
  Filled 2021-09-25: qty 90, 90d supply, fill #0

## 2021-09-29 ENCOUNTER — Other Ambulatory Visit: Payer: Self-pay

## 2021-09-29 ENCOUNTER — Telehealth: Payer: Self-pay | Admitting: Family Medicine

## 2021-09-29 MED ORDER — METFORMIN HCL 500 MG PO TABS: ORAL_TABLET | Freq: Two times a day (BID) | ORAL | 0 refills | Status: DC

## 2021-09-29 NOTE — Telephone Encounter (Signed)
Pt aware I have sent it in ?

## 2021-09-29 NOTE — Telephone Encounter (Signed)
Chief Complaint Prescription Refill or Medication Request (non ?symptomatic) ?Reason for Call Symptomatic / Request for Health Information ?Initial Comment Caller states she and family left to vacation ?yesterday and forgot her metformin '500mg'$  ?medication that she takes twice a day. ?Additional Comment Needs to have prescription called in, to the ?[facility] at [place], [place] ?[address]. ?Translation No ?Nurse Assessment ?Nurse: Brion Aliment, RN, Mickel Baas Date/Time Eilene Ghazi Time): 09/28/2021 8:32:01 AM ?Confirm and document reason for call. If ?symptomatic, describe symptoms. ?---Caller states she and family left to vacation ?yesterday and forgot her Metformin '500mg'$  medication ?that she takes twice a day. Has enough for the next 2 ?days, but not for next 8 days. Denied symptoms. ?Does the patient have any new or worsening ?symptoms? ---No ?Please document clinical information provided and ?list any resource used. ?---advised providers do not generally have the ability ?to write RX's out of state. Pt is on vacation in [place] ?Disp. Time (Eastern ?Time) Disposition Final User ?09/28/2021 8:39:42 AM Clinical Call Yes Brion Aliment, RN, Mickel Baas ?

## 2021-10-16 ENCOUNTER — Other Ambulatory Visit (HOSPITAL_COMMUNITY): Payer: Self-pay

## 2021-10-30 ENCOUNTER — Encounter: Payer: Self-pay | Admitting: Family Medicine

## 2021-10-30 ENCOUNTER — Ambulatory Visit: Payer: 59 | Admitting: Family Medicine

## 2021-10-30 VITALS — BP 118/72 | HR 71 | Temp 98.1°F | Resp 16 | Wt 223.8 lb

## 2021-10-30 DIAGNOSIS — E119 Type 2 diabetes mellitus without complications: Secondary | ICD-10-CM

## 2021-10-30 DIAGNOSIS — M79674 Pain in right toe(s): Secondary | ICD-10-CM | POA: Diagnosis not present

## 2021-10-30 LAB — BASIC METABOLIC PANEL
BUN: 17 mg/dL (ref 6–23)
CO2: 30 mEq/L (ref 19–32)
Calcium: 9.7 mg/dL (ref 8.4–10.5)
Chloride: 103 mEq/L (ref 96–112)
Creatinine, Ser: 0.74 mg/dL (ref 0.40–1.20)
GFR: 92.12 mL/min (ref >60.00)
Glucose, Bld: 95 mg/dL (ref 70–99)
Potassium: 4.1 mEq/L (ref 3.5–5.1)
Sodium: 141 mEq/L (ref 135–145)

## 2021-10-30 LAB — URIC ACID: Uric Acid, Serum: 5.3 mg/dL (ref 2.4–7.0)

## 2021-10-30 LAB — MICROALBUMIN / CREATININE URINE RATIO
Creatinine,U: 214 mg/dL
Microalb Creat Ratio: 0.7 mg/g (ref 0.0–30.0)
Microalb, Ur: 1.5 mg/dL (ref 0.0–1.9)

## 2021-10-30 LAB — HEMOGLOBIN A1C: Hgb A1c MFr Bld: 7 % — ABNORMAL HIGH (ref 4.6–6.5)

## 2021-10-30 NOTE — Patient Instructions (Addendum)
Follow up in 3-4 months to recheck diabetes, BP, cholesterol ?We'll notify you of your lab results and make any changes if needed ?INCREASE the Ozempic to 0.'5mg'$  weekly ?RESCHEDULE your eye exam ?Continue to work on healthy diet and regular exercise- you can do it! ?Call with any questions or concerns ?Stay Safe!  Stay Healthy! ?Happy Easter!!! ?

## 2021-10-30 NOTE — Assessment & Plan Note (Signed)
Pt is down 8 lbs since last visit.  She is pleased that she's losing but disappointed that it hasn't been more since starting Ozempic.  Again discussed the importance of low carb diet and regular exercise.  Will increase Ozempic to 0.'5mg'$  weekly.  Pt expressed understanding and is in agreement w/ plan.  ?

## 2021-10-30 NOTE — Progress Notes (Signed)
? ?  Subjective:  ? ? Patient ID: Jamie Hall, female    DOB: [birth year], 56 y.o.   MRN: [id number] ? ?HPI ?DM- chronic problem, currently on Metformin '500mg'$  BID and Ozempic 0.'25mg'$  weekly.  She is down 8 lbs since last visit.  UTD on foot exam.  On ACE for renal protection.  Due for eye exam- pt has to reschedule.  Denies nausea w/ Ozempic.  Only change since starting medication has been 'i'm not starving in the morning'.  No CP, SOB, HAs, visual changes, abd pain, N/V.  No numbness/tingling of hands and feet other than residual surgical changes.  1 symptomatic low while on vacation but no others. ? ?R MTP joint pain- started 2 weeks ago.  Is concerned she has gout as pain has been constant.  She did stub toe while walking but she feels that should be better by now. ? ? ?Review of Systems ?For ROS see HPI  ?   ?Objective:  ? Physical Exam ?Vitals reviewed.  ?Constitutional:   ?   General: She is not in acute distress. ?   Appearance: Normal appearance. She is well-developed. She is obese. She is not ill-appearing.  ?HENT:  ?   Head: Normocephalic and atraumatic.  ?Eyes:  ?   Conjunctiva/sclera: Conjunctivae normal.  ?   Pupils: Pupils are equal, round, and reactive to light.  ?Neck:  ?   Thyroid: No thyromegaly.  ?Cardiovascular:  ?   Rate and Rhythm: Normal rate and regular rhythm.  ?   Pulses: Normal pulses.  ?   Heart sounds: Normal heart sounds. No murmur heard. ?Pulmonary:  ?   Effort: Pulmonary effort is normal. No respiratory distress.  ?   Breath sounds: Normal breath sounds.  ?Abdominal:  ?   General: There is no distension.  ?   Palpations: Abdomen is soft.  ?   Tenderness: There is no abdominal tenderness.  ?Musculoskeletal:  ?   Cervical back: Normal range of motion and neck supple.  ?   Right lower leg: No edema.  ?   Left lower leg: No edema.  ?Lymphadenopathy:  ?   Cervical: No cervical adenopathy.  ?Skin: ?   General: Skin is warm and dry.  ?Neurological:  ?   Mental Status: She is alert and oriented  to person, place, and time.  ?Psychiatric:     ?   Mood and Affect: Mood normal.     ?   Behavior: Behavior normal.  ? ? ? ? ? ?   ?Assessment & Plan:  ?R MTP pain- new.  Pt is concerned for possible gout but admits she did stub toe a few weeks ago.  Pain is consistent.  No current redness or swelling.  Check UA level and start tx prn.  Pt expressed understanding and is in agreement w/ plan.  ? ?

## 2021-10-30 NOTE — Assessment & Plan Note (Signed)
Chronic problem.  Currently on Metformin '500mg'$  BID and Ozempic 0.'25mg'$  weekly.  She is tolerating both without difficulty.  Is disappointed that she has not lost more weight on Ozempic.  Will increase to 0.'5mg'$  weekly.  UTD on foot exam.  On ACE for renal protection.  Due for eye exam- pt had to reschedule.  Check labs.  No other anticipated med changes. ?

## 2021-10-31 ENCOUNTER — Other Ambulatory Visit (HOSPITAL_COMMUNITY): Payer: Self-pay

## 2021-11-14 ENCOUNTER — Other Ambulatory Visit (HOSPITAL_COMMUNITY): Payer: Self-pay

## 2021-11-17 ENCOUNTER — Other Ambulatory Visit: Payer: Self-pay | Admitting: Family Medicine

## 2021-11-18 ENCOUNTER — Other Ambulatory Visit (HOSPITAL_COMMUNITY): Payer: Self-pay

## 2021-11-18 MED ORDER — SEMAGLUTIDE(0.25 OR 0.5MG/DOS) 2 MG/3ML ~~LOC~~ SOPN
0.2500 mg | PEN_INJECTOR | SUBCUTANEOUS | 1 refills | Status: DC
  Filled 2021-11-18: qty 1.5, 56d supply, fill #0
  Filled 2021-11-18: qty 3, 56d supply, fill #0
  Filled 2021-12-18 – 2021-12-23 (×2): qty 3, 56d supply, fill #1

## 2021-12-08 DIAGNOSIS — H5213 Myopia, bilateral: Secondary | ICD-10-CM | POA: Diagnosis not present

## 2021-12-08 LAB — HM DIABETES EYE EXAM

## 2021-12-10 LAB — HM DIABETES EYE EXAM

## 2021-12-17 ENCOUNTER — Ambulatory Visit (INDEPENDENT_AMBULATORY_CARE_PROVIDER_SITE_OTHER): Payer: 59

## 2021-12-17 ENCOUNTER — Ambulatory Visit (INDEPENDENT_AMBULATORY_CARE_PROVIDER_SITE_OTHER): Payer: 59 | Admitting: Orthopaedic Surgery

## 2021-12-17 ENCOUNTER — Other Ambulatory Visit (HOSPITAL_COMMUNITY): Payer: Self-pay

## 2021-12-17 ENCOUNTER — Encounter: Payer: Self-pay | Admitting: Orthopaedic Surgery

## 2021-12-17 DIAGNOSIS — Z96651 Presence of right artificial knee joint: Secondary | ICD-10-CM

## 2021-12-17 DIAGNOSIS — G5731 Lesion of lateral popliteal nerve, right lower limb: Secondary | ICD-10-CM | POA: Diagnosis not present

## 2021-12-17 MED ORDER — MELOXICAM 15 MG PO TABS
15.0000 mg | ORAL_TABLET | Freq: Every day | ORAL | 3 refills | Status: DC
  Filled 2021-12-17: qty 30, 30d supply, fill #0
  Filled 2021-12-18 – 2022-01-19 (×2): qty 30, 30d supply, fill #1
  Filled 2022-02-11: qty 30, 30d supply, fill #2
  Filled 2022-03-11: qty 30, 30d supply, fill #3

## 2021-12-17 MED ORDER — PREGABALIN 50 MG PO CAPS
50.0000 mg | ORAL_CAPSULE | Freq: Three times a day (TID) | ORAL | 1 refills | Status: DC
  Filled 2021-12-17 (×2): qty 72, 24d supply, fill #0
  Filled 2021-12-17: qty 18, 6d supply, fill #0
  Filled 2021-12-18 (×2): qty 90, 30d supply, fill #0
  Filled 2022-01-19: qty 90, 30d supply, fill #1

## 2021-12-17 NOTE — Progress Notes (Signed)
Office Visit Note   Patient: Jamie Hall           Date of Birth: 1966/06/03           MRN: [id number] Visit Date: 12/17/2021              Requested by: Midge Minium, MD [address] [place],  [place] [address] PCP: Midge Minium, MD   Assessment & Plan: Visit Diagnoses:  1. History of total right knee replacement   2. Neuropathy of right peroneal nerve     Plan: Place her on Lyrica and vitamin B6.  Needs 600 mg twice daily.  She will continue her Mobic.  Continue work on range of motion knee quad strengthening.  Follow-up with Dr. Delilah Shan in 3 months to check her progress or lack of.  Reassurance was given that radiographs show good bony ingrowth with components.  Follow-Up Instructions: Return in about 3 months (around 03/19/2022).   Orders:  Orders Placed This Encounter  Procedures   XR Knee 1-2 Views Right   Meds ordered this encounter  Medications   pregabalin (LYRICA) 50 MG capsule    Sig: Take 1 capsule (50 mg total) by mouth 3 (three) times daily.    Dispense:  90 capsule    Refill:  1   meloxicam (MOBIC) 15 MG tablet    Sig: Take 1 tablet by mouth daily.    Dispense:  30 tablet    Refill:  3      Procedures: No procedures performed   Clinical Data: No additional findings.   Subjective: Chief Complaint  Patient presents with   Right Knee - Follow-up    HPI Jamie Hall returns today just over 1 year status post right total knee arthroplasty.  She states knee motion is overall doing well.  Main complaint is still anterior lateral and lateral knee pain 90% of her knee pains anterior lateral.  She states the pain is worse with stepping down and pushing down the leg.  She describes it as a numbness down the lateral lower leg.  She states with prolonged walking that the entire leg is numb for about 5 to 10 minutes from the knee down.  Her dropfoot has resolved.  Again EMG nerve conduction studies 2 months after the knee replacement showed right  common peroneal mononeuropathy with demyelination.  Review of Systems Denies any fevers or chills.  Objective: Vital Signs: There were no vitals taken for this visit.  Physical Exam General well-developed well-nourished female who ambulates without any assistive device. Ortho Exam Right knee full extension flexion to approximately 115 degrees.  No significant instability valgus varus stressing.  There is slight laxity with valgus stressing of the right knee.  Calf supple nontender.  Dorsiflexion plantarflexion right ankle intact.  5 out of 5 strength of the dorsiflexion plantarflexion against resistance.  Specialty Comments:  No specialty comments available.  Imaging: XR Knee 1-2 Views Right  Result Date: 12/17/2021 Right knee 2 views: Status post right total knee arthroplasty with well-seated well appearing new components.  No acute fractures or bony abnormalities.  Knee is well located.  No hardware failure.    PMFS History: Patient Active Problem List   Diagnosis Date Noted   Impingement syndrome of right shoulder 05/13/2021   Status post total right knee replacement 12/06/2020   Arthritis of right knee 12/05/2020   Acute tear medial meniscus, right, subsequent encounter 08/16/2020   Hyperlipidemia associated with type 2 diabetes  mellitus ([place]) 08/18/2019   Physical exam 02/03/2019   Diabetes mellitus without complication ([place]) 42/35/3614   Morbid obesity ([place]) 01/17/2018   HTN (hypertension) 01/17/2018   Cluster headaches 01/17/2018   Basal cell carcinoma of skin 02/18/2012   Past Medical History:  Diagnosis Date   DM type 2 (diabetes mellitus, type 2) ([place])    followed by pcp   GERD (gastroesophageal reflux disease)    Headache    History of kidney stones    Hypertension    OA (osteoarthritis)    PONV (postoperative nausea and vomiting)    Right knee meniscal tear     Family History  Problem Relation Age of Onset   Early death Mother    Stroke Mother     Healthy Father    Healthy Sister    Healthy Daughter    Cancer Maternal Grandfather    Drug abuse Maternal Grandfather    Hypertension Maternal Grandfather    Colon cancer Neg Hx    Esophageal cancer Neg Hx    Liver cancer Neg Hx    Pancreatic cancer Neg Hx    Rectal cancer Neg Hx    Stomach cancer Neg Hx     Past Surgical History:  Procedure Laterality Date   BREAST REDUCTION SURGERY  2006   CARPECTOMY Left 2007   proximal row, wrist   CYSTOSCOPY/URETEROSCOPY/HOLMIUM LASER/STENT PLACEMENT Left 06/07/2020   Procedure: CYSTOSCOPY/RETROGRADE/URETEROSCOPY/HOLMIUM LASER/STENT PLACEMENT;  Surgeon: Ceasar Mons, MD;  Location: [facility];  Service: Urology;  Laterality: Left;  ONLY NEEDS 45 MIN   GANGLION CYST EXCISION Right 1999   wrist   KIDNEY STONE SURGERY  06/07/2020   KNEE ARTHROSCOPY Right 2000   KNEE ARTHROSCOPY Right 08/16/2020   Procedure: RIGHT KNEE ARTHROSCOPY WITH PARTIAL MEDIAL MENISCECTOMY;  Surgeon: Mcarthur Rossetti, MD;  Location: [facility];  Service: Orthopedics;  Laterality: Right;   TONSILLECTOMY  age 34   TOTAL KNEE ARTHROPLASTY Right 12/06/2020   Procedure: RIGHT TOTAL KNEE ARTHROPLASTY;  Surgeon: Mcarthur Rossetti, MD;  Location: [facility];  Service: Orthopedics;  Laterality: Right;   Social History   Occupational History   Not on file  Tobacco Use   Smoking status: Never   Smokeless tobacco: Never  Vaping Use   Vaping Use: Never used  Substance and Sexual Activity   Alcohol use: Not Currently   Drug use: Not Currently   Sexual activity: Yes    Birth control/protection: I.U.D.

## 2021-12-18 ENCOUNTER — Other Ambulatory Visit (HOSPITAL_COMMUNITY): Payer: Self-pay

## 2021-12-23 ENCOUNTER — Other Ambulatory Visit (HOSPITAL_COMMUNITY): Payer: Self-pay

## 2021-12-23 ENCOUNTER — Other Ambulatory Visit: Payer: Self-pay | Admitting: Family Medicine

## 2021-12-23 MED ORDER — ATORVASTATIN CALCIUM 10 MG PO TABS
ORAL_TABLET | Freq: Every day | ORAL | 0 refills | Status: DC
  Filled 2021-12-23: qty 90, 90d supply, fill #0

## 2021-12-24 ENCOUNTER — Other Ambulatory Visit: Payer: Self-pay | Admitting: Family Medicine

## 2021-12-24 ENCOUNTER — Other Ambulatory Visit (HOSPITAL_COMMUNITY): Payer: Self-pay

## 2021-12-24 MED ORDER — METFORMIN HCL 500 MG PO TABS
ORAL_TABLET | Freq: Two times a day (BID) | ORAL | 1 refills | Status: DC
  Filled 2021-12-24: qty 180, 90d supply, fill #0
  Filled 2022-03-11: qty 180, 90d supply, fill #1

## 2021-12-24 MED ORDER — SEMAGLUTIDE(0.25 OR 0.5MG/DOS) 2 MG/3ML ~~LOC~~ SOPN
0.5000 mg | PEN_INJECTOR | SUBCUTANEOUS | 1 refills | Status: DC
  Filled 2021-12-24: qty 3, fill #0
  Filled 2022-01-01: qty 3, 28d supply, fill #0
  Filled 2022-02-03: qty 3, 28d supply, fill #1

## 2021-12-24 NOTE — Progress Notes (Signed)
Prescription sent to reflect increased dose

## 2022-01-01 ENCOUNTER — Other Ambulatory Visit (HOSPITAL_COMMUNITY): Payer: Self-pay

## 2022-01-19 ENCOUNTER — Other Ambulatory Visit: Payer: Self-pay | Admitting: Family Medicine

## 2022-01-19 ENCOUNTER — Other Ambulatory Visit (HOSPITAL_COMMUNITY): Payer: Self-pay

## 2022-01-19 MED ORDER — LISINOPRIL 10 MG PO TABS
ORAL_TABLET | Freq: Every day | ORAL | 1 refills | Status: DC
  Filled 2022-01-19: qty 90, 90d supply, fill #0
  Filled 2022-03-11: qty 90, 90d supply, fill #1

## 2022-02-03 ENCOUNTER — Other Ambulatory Visit (HOSPITAL_COMMUNITY): Payer: Self-pay

## 2022-02-05 ENCOUNTER — Ambulatory Visit: Payer: 59 | Admitting: Family Medicine

## 2022-02-06 ENCOUNTER — Ambulatory Visit: Payer: 59 | Admitting: Family Medicine

## 2022-02-11 ENCOUNTER — Other Ambulatory Visit: Payer: Self-pay

## 2022-02-11 ENCOUNTER — Other Ambulatory Visit (HOSPITAL_COMMUNITY): Payer: Self-pay

## 2022-02-12 ENCOUNTER — Other Ambulatory Visit (HOSPITAL_COMMUNITY): Payer: Self-pay

## 2022-02-18 ENCOUNTER — Other Ambulatory Visit (HOSPITAL_COMMUNITY): Payer: Self-pay

## 2022-02-20 ENCOUNTER — Ambulatory Visit: Payer: 59 | Admitting: Family Medicine

## 2022-02-20 ENCOUNTER — Encounter: Payer: Self-pay | Admitting: Family Medicine

## 2022-02-20 VITALS — BP 126/80 | HR 74 | Temp 98.8°F | Resp 16 | Ht 63.0 in | Wt 220.1 lb

## 2022-02-20 DIAGNOSIS — E1169 Type 2 diabetes mellitus with other specified complication: Secondary | ICD-10-CM | POA: Diagnosis not present

## 2022-02-20 DIAGNOSIS — E785 Hyperlipidemia, unspecified: Secondary | ICD-10-CM | POA: Diagnosis not present

## 2022-02-20 DIAGNOSIS — E119 Type 2 diabetes mellitus without complications: Secondary | ICD-10-CM

## 2022-02-20 DIAGNOSIS — I10 Essential (primary) hypertension: Secondary | ICD-10-CM

## 2022-02-20 LAB — CBC WITH DIFFERENTIAL/PLATELET
Basophils Absolute: 0.1 10*3/uL (ref 0.0–0.1)
Basophils Relative: 0.9 % (ref 0.0–3.0)
Eosinophils Absolute: 0.3 10*3/uL (ref 0.0–0.7)
Eosinophils Relative: 2.8 % (ref 0.0–5.0)
HCT: 40.4 % (ref 36.0–46.0)
Hemoglobin: 13.2 g/dL (ref 12.0–15.0)
Lymphocytes Relative: 21.7 % (ref 12.0–46.0)
Lymphs Abs: 2.2 10*3/uL (ref 0.7–4.0)
MCHC: 32.7 g/dL (ref 30.0–36.0)
MCV: 86.1 fl (ref 78.0–100.0)
Monocytes Absolute: 0.6 10*3/uL (ref 0.1–1.0)
Monocytes Relative: 5.5 % (ref 3.0–12.0)
Neutro Abs: 7.1 10*3/uL (ref 1.4–7.7)
Neutrophils Relative %: 69.1 % (ref 43.0–77.0)
Platelets: 339 10*3/uL (ref 150.0–400.0)
RBC: 4.69 Mil/uL (ref 3.87–5.11)
RDW: 14.9 % (ref 11.5–15.5)
WBC: 10.2 10*3/uL (ref 4.0–10.5)

## 2022-02-20 LAB — LIPID PANEL
Cholesterol: 129 mg/dL (ref 0–200)
HDL: 46.7 mg/dL (ref >39.00)
LDL Cholesterol: 69 mg/dL (ref 0–99)
NonHDL: 82.29
Total CHOL/HDL Ratio: 3
Triglycerides: 65 mg/dL (ref 0.0–149.0)
VLDL: 13 mg/dL (ref 0.0–40.0)

## 2022-02-20 LAB — BASIC METABOLIC PANEL
BUN: 16 mg/dL (ref 6–23)
CO2: 31 mEq/L (ref 19–32)
Calcium: 9.9 mg/dL (ref 8.4–10.5)
Chloride: 102 mEq/L (ref 96–112)
Creatinine, Ser: 0.85 mg/dL (ref 0.40–1.20)
GFR: 77.83 mL/min (ref >60.00)
Glucose, Bld: 100 mg/dL — ABNORMAL HIGH (ref 70–99)
Potassium: 4.3 mEq/L (ref 3.5–5.1)
Sodium: 140 mEq/L (ref 135–145)

## 2022-02-20 LAB — HEPATIC FUNCTION PANEL
ALT: 33 U/L (ref 0–35)
AST: 25 U/L (ref 0–37)
Albumin: 4.5 g/dL (ref 3.5–5.2)
Alkaline Phosphatase: 82 U/L (ref 39–117)
Bilirubin, Direct: 0.1 mg/dL (ref 0.0–0.3)
Total Bilirubin: 0.4 mg/dL (ref 0.2–1.2)
Total Protein: 7.6 g/dL (ref 6.0–8.3)

## 2022-02-20 LAB — TSH: TSH: 1.48 u[IU]/mL (ref 0.35–5.50)

## 2022-02-20 LAB — HEMOGLOBIN A1C: Hgb A1c MFr Bld: 6.8 % — ABNORMAL HIGH (ref 4.6–6.5)

## 2022-02-20 NOTE — Patient Instructions (Signed)
Follow up in 3-4 months to recheck diabetes We'll notify you of your lab results and make any changes if needed Continue to work on healthy diet and regular exercise- you can do it! Call with any questions or concerns Stay Safe!  Stay Healthy! Hang in there!!!

## 2022-02-20 NOTE — Progress Notes (Signed)
   Subjective:    Patient ID: Jamie Hall, female    DOB: [birth year], 56 y.o.   MRN: [id number]  HPI DM- chronic problem, on Metformin '500mg'$  BID, Ozempic 0.'5mg'$  weekly.  UTD on foot exam, eye exam, microalbumin.  Denies symptomatic lows.  No numbness/tingling hands/feet.  Hyperlipidemia- chronic problem, on Lipitor '10mg'$  daily.  Denies abd pain, N/V.  HTN- chronic problem, on Lisinopril '10mg'$  daily w/ good control.  Denies CP, SOB, HAs, visual changes, edema.   Review of Systems For ROS see HPI     Objective:   Physical Exam Vitals reviewed.  Constitutional:      General: She is not in acute distress.    Appearance: Normal appearance. She is well-developed. She is obese. She is not ill-appearing.  HENT:     Head: Normocephalic and atraumatic.  Eyes:     Conjunctiva/sclera: Conjunctivae normal.     Pupils: Pupils are equal, round, and reactive to light.  Neck:     Thyroid: No thyromegaly.  Cardiovascular:     Rate and Rhythm: Normal rate and regular rhythm.     Pulses: Normal pulses.     Heart sounds: Normal heart sounds. No murmur heard. Pulmonary:     Effort: Pulmonary effort is normal. No respiratory distress.     Breath sounds: Normal breath sounds.  Abdominal:     General: There is no distension.     Palpations: Abdomen is soft.     Tenderness: There is no abdominal tenderness.  Musculoskeletal:     Cervical back: Normal range of motion and neck supple.     Right lower leg: No edema.     Left lower leg: No edema.  Lymphadenopathy:     Cervical: No cervical adenopathy.  Skin:    General: Skin is warm and dry.  Neurological:     Mental Status: She is alert and oriented to person, place, and time.  Psychiatric:        Behavior: Behavior normal.          Assessment & Plan:

## 2022-02-22 NOTE — Assessment & Plan Note (Signed)
Chronic problem.  Currently well controlled on Lisinopril '10mg'$  daily.  Asymptomatic.  Check labs due to ACE but no anticipated med changes.  Will follow.

## 2022-02-22 NOTE — Assessment & Plan Note (Signed)
Chronic problem.  Currently tolerating Lipitor w/o difficulty.  Check labs.  Adjust meds prn

## 2022-02-22 NOTE — Assessment & Plan Note (Signed)
Chronic problem.  Currently on Metformin '500mg'$  BID and Ozempic 0.'5mg'$  weekly w/o difficulty.  UTD on foot exam, eye exam, microalbumin.  Currently asymptomatic.  Check labs.  Adjust meds prn

## 2022-02-23 ENCOUNTER — Encounter: Payer: Self-pay | Admitting: Family Medicine

## 2022-02-23 ENCOUNTER — Telehealth: Payer: Self-pay

## 2022-02-23 NOTE — Telephone Encounter (Signed)
-----   Message from Midge Minium, MD sent at 02/22/2022  5:12 PM EDT ----- Labs look great!  No changes at this time

## 2022-02-23 NOTE — Telephone Encounter (Signed)
Informed pt of lab results  

## 2022-03-11 ENCOUNTER — Other Ambulatory Visit: Payer: Self-pay

## 2022-03-11 ENCOUNTER — Other Ambulatory Visit (HOSPITAL_COMMUNITY): Payer: Self-pay

## 2022-03-11 ENCOUNTER — Telehealth: Payer: Self-pay | Admitting: Family Medicine

## 2022-03-11 ENCOUNTER — Other Ambulatory Visit: Payer: Self-pay | Admitting: Family Medicine

## 2022-03-11 MED ORDER — OZEMPIC (0.25 OR 0.5 MG/DOSE) 2 MG/3ML ~~LOC~~ SOPN
0.5000 mg | PEN_INJECTOR | SUBCUTANEOUS | 1 refills | Status: DC
  Filled 2022-03-11: qty 3, 28d supply, fill #0

## 2022-03-11 MED ORDER — OZEMPIC (0.25 OR 0.5 MG/DOSE) 2 MG/3ML ~~LOC~~ SOPN
0.5000 mg | PEN_INJECTOR | SUBCUTANEOUS | 1 refills | Status: DC
  Filled 2022-03-11: qty 6, 56d supply, fill #0

## 2022-03-11 MED ORDER — ATORVASTATIN CALCIUM 10 MG PO TABS
ORAL_TABLET | Freq: Every day | ORAL | 1 refills | Status: DC
  Filled 2022-03-11: qty 90, 90d supply, fill #0

## 2022-03-11 MED ORDER — METFORMIN HCL 500 MG PO TABS
ORAL_TABLET | Freq: Two times a day (BID) | ORAL | 1 refills | Status: DC
  Filled 2022-03-11: qty 180, 90d supply, fill #0

## 2022-03-11 MED ORDER — OMEPRAZOLE 20 MG PO CPDR
DELAYED_RELEASE_CAPSULE | Freq: Every day | ORAL | 1 refills | Status: DC
  Filled 2022-03-11: qty 90, 90d supply, fill #0

## 2022-03-11 MED ORDER — ATORVASTATIN CALCIUM 10 MG PO TABS
ORAL_TABLET | Freq: Every day | ORAL | 0 refills | Status: DC
  Filled 2022-03-11: qty 90, fill #0

## 2022-03-11 MED ORDER — OMEPRAZOLE 20 MG PO CPDR
DELAYED_RELEASE_CAPSULE | Freq: Every day | ORAL | 1 refills | Status: DC
  Filled 2022-03-11: qty 90, fill #0

## 2022-03-11 NOTE — Telephone Encounter (Signed)
Caller name: Jasie Meleski   On DPR? :yes/no: Yes  Call back number: [phone]  Provider they see: Birdie Riddle   Reason for call: Pt called stating that she need a 90 day supply on for medications Metformin 500 mg, Lisinopril 10 mg, Omeprazole 20 mg, Atorvastatin 10 mg and Semaglutide 0.25 or 0.5 mg. Pt states that she losing her insurance. Pt would like for her medications to be sent [facility].

## 2022-03-11 NOTE — Telephone Encounter (Signed)
Sent refills in to pharmacy for 90 days

## 2022-03-12 ENCOUNTER — Other Ambulatory Visit (HOSPITAL_COMMUNITY): Payer: Self-pay

## 2022-03-16 ENCOUNTER — Other Ambulatory Visit: Payer: Self-pay | Admitting: Physician Assistant

## 2022-03-16 ENCOUNTER — Other Ambulatory Visit (HOSPITAL_COMMUNITY): Payer: Self-pay

## 2022-03-16 MED ORDER — PREGABALIN 50 MG PO CAPS
50.0000 mg | ORAL_CAPSULE | Freq: Three times a day (TID) | ORAL | 1 refills | Status: DC
Start: 2022-03-16 — End: 2022-03-23
  Filled 2022-03-16: qty 90, 30d supply, fill #0

## 2022-03-17 ENCOUNTER — Other Ambulatory Visit (HOSPITAL_COMMUNITY): Payer: Self-pay

## 2022-03-23 ENCOUNTER — Encounter: Payer: Self-pay | Admitting: Orthopaedic Surgery

## 2022-03-23 ENCOUNTER — Ambulatory Visit (INDEPENDENT_AMBULATORY_CARE_PROVIDER_SITE_OTHER): Payer: 59 | Admitting: Orthopaedic Surgery

## 2022-03-23 ENCOUNTER — Other Ambulatory Visit (HOSPITAL_COMMUNITY): Payer: Self-pay

## 2022-03-23 DIAGNOSIS — Z96651 Presence of right artificial knee joint: Secondary | ICD-10-CM | POA: Diagnosis not present

## 2022-03-23 DIAGNOSIS — G5731 Lesion of lateral popliteal nerve, right lower limb: Secondary | ICD-10-CM | POA: Diagnosis not present

## 2022-03-23 MED ORDER — MELOXICAM 15 MG PO TABS
15.0000 mg | ORAL_TABLET | Freq: Every day | ORAL | 3 refills | Status: DC
Start: 2022-03-23 — End: 2022-04-30
  Filled 2022-03-23: qty 90, 90d supply, fill #0

## 2022-03-23 MED ORDER — PREGABALIN 50 MG PO CAPS
50.0000 mg | ORAL_CAPSULE | Freq: Three times a day (TID) | ORAL | 0 refills | Status: DC
Start: 2022-03-23 — End: 2022-04-30
  Filled 2022-03-23: qty 270, 90d supply, fill #0

## 2022-03-23 MED ORDER — NITROGLYCERIN 0.3 MG/HR TD PT24
0.3000 mg | MEDICATED_PATCH | Freq: Every day | TRANSDERMAL | 0 refills | Status: DC
Start: 2022-03-23 — End: 2022-09-28
  Filled 2022-03-23: qty 30, 30d supply, fill #0

## 2022-03-23 NOTE — Progress Notes (Signed)
Jamie Hall is now 15 months status post a press-fit right total knee arthroplasty.  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  She still has a lot of lateral pain around the knee and it really bothers her quite a bit.  She is on Lyrica 50 mg 3 times a day and meloxicam.  She walks without assistive device.  She did let me know that if she was just laid off from her job can.  On exam her knee she has no effusion.  It feels stable on my exam with excellent range of motion.  Her motor exam is intact.  She has a lateral lateral pain with that knee.  I would like to try nitroglycerin patch over the lateral aspect of the her knee daily and continue her Lyrica and meloxicam.  I will see her back in 3 months with an AP and lateral of her right operative knee.  At that point we may consider three-phase bone scan.

## 2022-03-24 ENCOUNTER — Other Ambulatory Visit (HOSPITAL_COMMUNITY): Payer: Self-pay

## 2022-03-31 ENCOUNTER — Other Ambulatory Visit (HOSPITAL_COMMUNITY): Payer: Self-pay

## 2022-04-30 ENCOUNTER — Other Ambulatory Visit (HOSPITAL_COMMUNITY): Payer: Self-pay

## 2022-04-30 ENCOUNTER — Other Ambulatory Visit: Payer: Self-pay | Admitting: Orthopaedic Surgery

## 2022-04-30 ENCOUNTER — Telehealth: Payer: Self-pay | Admitting: Orthopaedic Surgery

## 2022-04-30 MED ORDER — PREGABALIN 50 MG PO CAPS: 50.0000 mg | ORAL_CAPSULE | Freq: Three times a day (TID) | ORAL | 0 refills | Status: DC

## 2022-04-30 MED ORDER — MELOXICAM 15 MG PO TABS: 15.0000 mg | ORAL_TABLET | Freq: Every day | ORAL | 3 refills | Status: DC

## 2022-04-30 NOTE — Telephone Encounter (Signed)
Pt needs maloxan '15mg'$  pregamalbe '50mg'$  sent the pharmcary in [facility] [place] [phone] fax   [phone]([id number])

## 2022-04-30 NOTE — Telephone Encounter (Signed)
Patient called needing Rx refilled Meloxicam 15 mg. Patient asked if Rx can be sent to the pharmacy -[facility]. 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  The number to contact patient is [phone]

## 2022-05-08 ENCOUNTER — Other Ambulatory Visit: Payer: Self-pay | Admitting: Orthopaedic Surgery

## 2022-05-08 ENCOUNTER — Other Ambulatory Visit: Payer: Self-pay | Admitting: Family Medicine

## 2022-05-08 ENCOUNTER — Other Ambulatory Visit: Payer: Self-pay

## 2022-05-08 MED ORDER — ATORVASTATIN CALCIUM 10 MG PO TABS
ORAL_TABLET | Freq: Every day | ORAL | 1 refills | Status: DC
Start: 2022-05-08 — End: 2022-10-12

## 2022-05-08 MED ORDER — MELOXICAM 15 MG PO TABS: 15.0000 mg | ORAL_TABLET | Freq: Every day | ORAL | 3 refills | Status: DC

## 2022-05-08 MED ORDER — OZEMPIC (0.25 OR 0.5 MG/DOSE) 2 MG/3ML ~~LOC~~ SOPN: 0.5000 mg | PEN_INJECTOR | SUBCUTANEOUS | 1 refills | Status: DC

## 2022-05-08 MED ORDER — PREGABALIN 50 MG PO CAPS: 50.0000 mg | ORAL_CAPSULE | Freq: Three times a day (TID) | ORAL | 0 refills | Status: DC

## 2022-05-08 MED ORDER — OMEPRAZOLE 20 MG PO CPDR: DELAYED_RELEASE_CAPSULE | Freq: Every day | ORAL | 1 refills | Status: DC

## 2022-05-08 MED ORDER — METFORMIN HCL 500 MG PO TABS
ORAL_TABLET | Freq: Two times a day (BID) | ORAL | 0 refills | Status: DC
Start: 2022-05-08 — End: 2022-05-19

## 2022-05-08 MED ORDER — LISINOPRIL 10 MG PO TABS: ORAL_TABLET | Freq: Every day | ORAL | 1 refills | Status: DC

## 2022-05-08 NOTE — Telephone Encounter (Signed)
Patient called needing Rx refilled Meloxicam 15 mg. 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

## 2022-05-08 NOTE — Telephone Encounter (Signed)
Ok to fill 

## 2022-05-08 NOTE — Telephone Encounter (Signed)
I sent the meloxicam. I called pt. She stated she also needs the lyrica sent. Please send

## 2022-05-08 NOTE — Progress Notes (Signed)
Patient called in to r/s her appt, she is currently on a travel nurse assignment in [place] and also asked for refills of her meds which I have completed.

## 2022-05-19 ENCOUNTER — Other Ambulatory Visit: Payer: Self-pay | Admitting: Family Medicine

## 2022-05-22 ENCOUNTER — Other Ambulatory Visit: Payer: Self-pay

## 2022-05-22 ENCOUNTER — Other Ambulatory Visit: Payer: Self-pay | Admitting: Family Medicine

## 2022-05-22 MED ORDER — OZEMPIC (0.25 OR 0.5 MG/DOSE) 2 MG/3ML ~~LOC~~ SOPN: 0.5000 mg | PEN_INJECTOR | SUBCUTANEOUS | 1 refills | Status: DC

## 2022-05-23 DIAGNOSIS — Z111 Encounter for screening for respiratory tuberculosis: Secondary | ICD-10-CM | POA: Diagnosis not present

## 2022-05-25 ENCOUNTER — Other Ambulatory Visit: Payer: Self-pay | Admitting: Orthopaedic Surgery

## 2022-05-25 ENCOUNTER — Telehealth: Payer: Self-pay | Admitting: Orthopaedic Surgery

## 2022-05-25 ENCOUNTER — Ambulatory Visit: Payer: 59 | Admitting: Family Medicine

## 2022-05-25 MED ORDER — MELOXICAM 15 MG PO TABS: 15.0000 mg | ORAL_TABLET | Freq: Every day | ORAL | 3 refills | Status: DC

## 2022-05-25 NOTE — Telephone Encounter (Signed)
Pt [facility]  [address] [place],[address]. Pharmacy phone number [phone]. Pharmacy doesn't have fax. She needs a new one called because they will not fill the script if it  from another state.

## 2022-05-26 ENCOUNTER — Other Ambulatory Visit (HOSPITAL_COMMUNITY): Payer: Self-pay

## 2022-05-27 ENCOUNTER — Other Ambulatory Visit (HOSPITAL_COMMUNITY): Payer: Self-pay

## 2022-05-27 ENCOUNTER — Telehealth: Payer: Self-pay

## 2022-05-27 NOTE — Telephone Encounter (Signed)
Pharmacy Patient Advocate Encounter   Received notification from [facility] that prior authorization for Ozempic (0.25 or 0.5 MG/DOSE) '2MG'$ /3ML pen-injectors is required/requested.   PA submitted on 05/27/2022 to [place] via CoverMyMeds Key [id number]  Status is pending

## 2022-05-28 ENCOUNTER — Other Ambulatory Visit (HOSPITAL_COMMUNITY): Payer: Self-pay

## 2022-05-28 MED ORDER — TRULICITY 1.5 MG/0.5ML ~~LOC~~ SOAJ
1.5000 mg | SUBCUTANEOUS | 3 refills | Status: DC
  Filled 2022-05-28: qty 2, 28d supply, fill #0

## 2022-05-28 MED ORDER — TRULICITY 1.5 MG/0.5ML ~~LOC~~ SOAJ
1.5000 mg | SUBCUTANEOUS | 0 refills | Status: DC
  Filled 2022-05-30: qty 2, 28d supply, fill #0

## 2022-05-28 NOTE — Telephone Encounter (Signed)
Ok to send 90 day supply to [place] as pt requests

## 2022-05-28 NOTE — Telephone Encounter (Signed)
Pharmacy Patient Advocate Encounter  Received notification from [facility] [employer] that the request for prior authorization for Ozempic (0.25 or 0.5 MG/DOSE) '2MG'$ /3ML pen-injectors has been denied due to The member has not tried and failed the required number of formulary alternatives. Formulary alternative(s) are Victoza, Trulicity. Requirement: 2 in a class with 2 alternatives. Note: Formulary alternatives may require a prior authorization  Key: [id number]  Denial letter attached to patient's chart

## 2022-05-28 NOTE — Addendum Note (Signed)
Addended by: Patrcia Dolly on: 05/28/2022 02:03 PM   Modules accepted: Orders

## 2022-05-28 NOTE — Telephone Encounter (Signed)
I changed prescription to weekly Trulicity.  Please let pt know this was an insurance requirement

## 2022-05-28 NOTE — Telephone Encounter (Signed)
Called lm to call back so we could discuss the prescription change

## 2022-05-28 NOTE — Telephone Encounter (Signed)
Patient was informed and asked if [facility] and as a 90 day due to her being in new [place] the next 3 months

## 2022-05-28 NOTE — Addendum Note (Signed)
Addended by: Patrcia Dolly on: 05/28/2022 02:59 PM   Modules accepted: Orders

## 2022-05-29 ENCOUNTER — Encounter: Payer: Self-pay | Admitting: Family Medicine

## 2022-05-29 ENCOUNTER — Ambulatory Visit: Payer: 59 | Admitting: Family Medicine

## 2022-05-29 ENCOUNTER — Other Ambulatory Visit (HOSPITAL_COMMUNITY): Payer: Self-pay

## 2022-05-29 VITALS — BP 130/78 | HR 67 | Temp 97.6°F | Resp 18 | Ht 63.0 in | Wt 212.5 lb

## 2022-05-29 DIAGNOSIS — E119 Type 2 diabetes mellitus without complications: Secondary | ICD-10-CM

## 2022-05-29 LAB — BASIC METABOLIC PANEL
BUN: 17 mg/dL (ref 6–23)
CO2: 30 mEq/L (ref 19–32)
Calcium: 10 mg/dL (ref 8.4–10.5)
Chloride: 104 mEq/L (ref 96–112)
Creatinine, Ser: 0.72 mg/dL (ref 0.40–1.20)
GFR: 94.81 mL/min (ref >60.00)
Glucose, Bld: 97 mg/dL (ref 70–99)
Potassium: 4.1 mEq/L (ref 3.5–5.1)
Sodium: 141 mEq/L (ref 135–145)

## 2022-05-29 LAB — HEMOGLOBIN A1C: Hgb A1c MFr Bld: 6.6 % — ABNORMAL HIGH (ref 4.6–6.5)

## 2022-05-29 NOTE — Patient Instructions (Signed)
Schedule your complete physical in 3-4 months We'll notify you of your lab results and make any changes if needed Continue to work on healthy, low carb diet and regular physical exercise- you're doing great! Call with any questions or concerns Stay Safe!  Stay Healthy! Happy Fall!!!

## 2022-05-29 NOTE — Progress Notes (Signed)
   Subjective:    Patient ID: Jamie Hall, female    DOB: 03-May-1966, 56 y.o.   MRN: [id number]  HPI DM- chronic problem, on Trulicity 1.'5mg'$  weekly, Metformin '500mg'$  BID w/ hx of good control.  Last A1C 6.8%  UTD on eye exam, foot exam.  UTD on microalbumin.  On ACE for renal protection.  Down 8 lbs since last visit.  Pt reports feeling good.  No CP, SOB, Has, visual changes, abd pain, N/V.  Denies symptomatic lows.  No numbness/tingling of hands/feet   Review of Systems For ROS see HPI     Objective:   Physical Exam Vitals reviewed.  Constitutional:      General: She is not in acute distress.    Appearance: Normal appearance. She is well-developed. She is obese. She is not ill-appearing.  HENT:     Head: Normocephalic and atraumatic.  Eyes:     Conjunctiva/sclera: Conjunctivae normal.     Pupils: Pupils are equal, round, and reactive to light.  Neck:     Thyroid: No thyromegaly.  Cardiovascular:     Rate and Rhythm: Normal rate and regular rhythm.     Pulses: Normal pulses.     Heart sounds: Normal heart sounds. No murmur heard. Pulmonary:     Effort: Pulmonary effort is normal. No respiratory distress.     Breath sounds: Normal breath sounds.  Abdominal:     General: There is no distension.     Palpations: Abdomen is soft.     Tenderness: There is no abdominal tenderness.  Musculoskeletal:     Cervical back: Normal range of motion and neck supple.     Right lower leg: No edema.     Left lower leg: No edema.  Lymphadenopathy:     Cervical: No cervical adenopathy.  Skin:    General: Skin is warm and dry.  Neurological:     General: No focal deficit present.     Mental Status: She is alert and oriented to person, place, and time.  Psychiatric:        Behavior: Behavior normal.           Assessment & Plan:

## 2022-05-29 NOTE — Assessment & Plan Note (Signed)
Chronic problem.  Hx of good control on GLP1 and Metformin '500mg'$  BID.  Unfortunately insurance stopped paying for Ozempic and we had to switch to Trulicity.  Pt has not yet picked up the medication.  UTD on foot exam, eye exam, microalbumin.  Applauded her weight loss efforts and encouraged her to continue.  Check labs.  Adjust meds prn

## 2022-05-30 ENCOUNTER — Other Ambulatory Visit (HOSPITAL_COMMUNITY): Payer: Self-pay

## 2022-06-01 NOTE — Progress Notes (Signed)
Informed pt of lab results  

## 2022-06-08 ENCOUNTER — Ambulatory Visit: Payer: 59 | Admitting: Family Medicine

## 2022-06-12 ENCOUNTER — Telehealth: Payer: Self-pay | Admitting: Family Medicine

## 2022-06-12 NOTE — Telephone Encounter (Signed)
Patient called in stating that she has taken two doses of the Trulicity and she has been getting some really low numbers. She isn't sure if it's from her coming off of the ozempic or if it's truly the trulicity, about 30 minutes after she eats her reading is in the 60's and she feels like her stomach is quivering when it gets that low. She feels like she has to constantly eat to keep her sugar up. She is out of town currently but will be back in town next week and says she can come in if need be.

## 2022-06-15 ENCOUNTER — Encounter: Payer: Self-pay | Admitting: Orthopaedic Surgery

## 2022-06-15 ENCOUNTER — Other Ambulatory Visit (HOSPITAL_COMMUNITY): Payer: Self-pay

## 2022-06-15 ENCOUNTER — Other Ambulatory Visit: Payer: Self-pay | Admitting: Family Medicine

## 2022-06-15 ENCOUNTER — Ambulatory Visit (INDEPENDENT_AMBULATORY_CARE_PROVIDER_SITE_OTHER): Payer: 59

## 2022-06-15 ENCOUNTER — Ambulatory Visit: Payer: 59 | Admitting: Orthopaedic Surgery

## 2022-06-15 DIAGNOSIS — G5731 Lesion of lateral popliteal nerve, right lower limb: Secondary | ICD-10-CM

## 2022-06-15 DIAGNOSIS — Z96651 Presence of right artificial knee joint: Secondary | ICD-10-CM | POA: Diagnosis not present

## 2022-06-15 DIAGNOSIS — G5791 Unspecified mononeuropathy of right lower limb: Secondary | ICD-10-CM | POA: Diagnosis not present

## 2022-06-15 MED ORDER — TRULICITY 0.75 MG/0.5ML ~~LOC~~ SOAJ
0.7500 mg | SUBCUTANEOUS | 3 refills | Status: DC
  Filled 2022-06-15: qty 2, 28d supply, fill #0

## 2022-06-15 MED ORDER — PREGABALIN 75 MG PO CAPS: 75.0000 mg | ORAL_CAPSULE | Freq: Two times a day (BID) | ORAL | 6 refills | Status: DC

## 2022-06-15 NOTE — Telephone Encounter (Signed)
Left pt a VM stating that Dr Birdie Riddle sent in the correct Trulicity pen . Stop the higher dose

## 2022-06-15 NOTE — Telephone Encounter (Signed)
My mistake.  I forgot Trulicity was a pre-filled syringe and not a dial up dose, multi-use pen.  I will need to send the lower dose (0.'75mg'$  weekly) pen to her pharmacy.  I would not take any more of the higher dose since she feels badly

## 2022-06-15 NOTE — Telephone Encounter (Signed)
When did she take her dose of Trulicity?  If she is due for another weekly dose, I would decrease it to '1mg'$  weekly rather than 1.'5mg'$ .  If she doesn't have a dose due, she needs to be mindful of grazing throughout the day to make sure that her sugar doesn't drop.

## 2022-06-15 NOTE — Telephone Encounter (Signed)
How does patient adjust dose with prefilled syringe?

## 2022-06-15 NOTE — Progress Notes (Signed)
The patient is a 56 year old female who is a year and a half status post a right total knee arthroplasty.  Her postoperative course was complicated by foot drop.  She has had chronic neuropathy and lateral pain.  The only medication that is helped has been Lyrica.  We tried conservative treatment with anti-inflammatories as well as gabapentin.  We then had her on 75 mg of Lyrica which helped the most.  However insurance now does not want to approve that medication.  She has been off of Lyrica and now her symptoms have come back.  Her pain is persistent on the lateral aspect of her knee and is consistent with a chronic neuroma.  She has excellent range of motion of her right knee with no instability on exam.  She still has just some slight weakness and foot drop on the right side but overall is made significant improvements except for that persistent lateral pain that does have a positive Tinel's sign over the lateral side of the knee.  2 views of the right knee show well-seated press-fit total knee arthroplasty due to the bone ingrown with excellent alignment.  I will try her on Lyrica again 75 mg twice a day since that is helped for the most.  She has tried and failed all other medication options and this is helped her the most.  She will continue meloxicam as needed.  Follow-up for her knee at this point is as needed.  All question concerns were answered and addressed.

## 2022-06-15 NOTE — Progress Notes (Signed)
New prescription for lower dose sent to [facility]

## 2022-06-23 ENCOUNTER — Other Ambulatory Visit (HOSPITAL_COMMUNITY): Payer: Self-pay

## 2022-06-23 ENCOUNTER — Ambulatory Visit: Payer: 59 | Admitting: Orthopaedic Surgery

## 2022-06-24 ENCOUNTER — Other Ambulatory Visit (HOSPITAL_COMMUNITY): Payer: Self-pay

## 2022-07-25 ENCOUNTER — Other Ambulatory Visit (HOSPITAL_COMMUNITY): Payer: Self-pay

## 2022-08-07 ENCOUNTER — Other Ambulatory Visit: Payer: Self-pay | Admitting: Family Medicine

## 2022-08-07 NOTE — Telephone Encounter (Signed)
Left VM stating Rx was sent

## 2022-08-07 NOTE — Telephone Encounter (Signed)
Pharmacy is asking if we change medication to 90 days is this ok ?

## 2022-08-10 ENCOUNTER — Ambulatory Visit (INDEPENDENT_AMBULATORY_CARE_PROVIDER_SITE_OTHER): Payer: 59 | Admitting: Family Medicine

## 2022-08-10 ENCOUNTER — Encounter: Payer: Self-pay | Admitting: Family Medicine

## 2022-08-10 ENCOUNTER — Other Ambulatory Visit: Payer: Self-pay | Admitting: Family Medicine

## 2022-08-10 VITALS — BP 128/80 | HR 64 | Temp 98.8°F | Resp 17 | Ht 63.0 in | Wt 220.0 lb

## 2022-08-10 DIAGNOSIS — E559 Vitamin D deficiency, unspecified: Secondary | ICD-10-CM

## 2022-08-10 DIAGNOSIS — E119 Type 2 diabetes mellitus without complications: Secondary | ICD-10-CM | POA: Diagnosis not present

## 2022-08-10 DIAGNOSIS — Z23 Encounter for immunization: Secondary | ICD-10-CM | POA: Diagnosis not present

## 2022-08-10 LAB — LIPID PANEL
Cholesterol: 126 mg/dL (ref 0–200)
HDL: 47.9 mg/dL (ref >39.00)
LDL Cholesterol: 68 mg/dL (ref 0–99)
NonHDL: 78.48
Total CHOL/HDL Ratio: 3
Triglycerides: 51 mg/dL (ref 0.0–149.0)
VLDL: 10.2 mg/dL (ref 0.0–40.0)

## 2022-08-10 LAB — HEPATIC FUNCTION PANEL
ALT: 27 U/L (ref 0–35)
AST: 19 U/L (ref 0–37)
Albumin: 4.2 g/dL (ref 3.5–5.2)
Alkaline Phosphatase: 69 U/L (ref 39–117)
Bilirubin, Direct: 0.1 mg/dL (ref 0.0–0.3)
Total Bilirubin: 0.3 mg/dL (ref 0.2–1.2)
Total Protein: 6.8 g/dL (ref 6.0–8.3)

## 2022-08-10 LAB — BASIC METABOLIC PANEL
BUN: 16 mg/dL (ref 6–23)
CO2: 30 mEq/L (ref 19–32)
Calcium: 9.6 mg/dL (ref 8.4–10.5)
Chloride: 104 mEq/L (ref 96–112)
Creatinine, Ser: 0.71 mg/dL (ref 0.40–1.20)
GFR: 96.28 mL/min (ref >60.00)
Glucose, Bld: 82 mg/dL (ref 70–99)
Potassium: 3.8 mEq/L (ref 3.5–5.1)
Sodium: 142 mEq/L (ref 135–145)

## 2022-08-10 LAB — CBC WITH DIFFERENTIAL/PLATELET
Basophils Absolute: 0.1 10*3/uL (ref 0.0–0.1)
Basophils Relative: 1 % (ref 0.0–3.0)
Eosinophils Absolute: 0.3 10*3/uL (ref 0.0–0.7)
Eosinophils Relative: 2.7 % (ref 0.0–5.0)
HCT: 39.5 % (ref 36.0–46.0)
Hemoglobin: 12.7 g/dL (ref 12.0–15.0)
Lymphocytes Relative: 22.3 % (ref 12.0–46.0)
Lymphs Abs: 2.5 10*3/uL (ref 0.7–4.0)
MCHC: 32.1 g/dL (ref 30.0–36.0)
MCV: 86.6 fl (ref 78.0–100.0)
Monocytes Absolute: 0.7 10*3/uL (ref 0.1–1.0)
Monocytes Relative: 6.3 % (ref 3.0–12.0)
Neutro Abs: 7.7 10*3/uL (ref 1.4–7.7)
Neutrophils Relative %: 67.7 % (ref 43.0–77.0)
Platelets: 390 10*3/uL (ref 150.0–400.0)
RBC: 4.56 Mil/uL (ref 3.87–5.11)
RDW: 15 % (ref 11.5–15.5)
WBC: 11.4 10*3/uL — ABNORMAL HIGH (ref 4.0–10.5)

## 2022-08-10 LAB — VITAMIN D 25 HYDROXY (VIT D DEFICIENCY, FRACTURES): VITD: 34.49 ng/mL (ref 30.00–100.00)

## 2022-08-10 LAB — HEMOGLOBIN A1C: Hgb A1c MFr Bld: 6.7 % — ABNORMAL HIGH (ref 4.6–6.5)

## 2022-08-10 LAB — TSH: TSH: 1.48 u[IU]/mL (ref 0.35–5.50)

## 2022-08-10 MED ORDER — OZEMPIC (0.25 OR 0.5 MG/DOSE) 2 MG/3ML ~~LOC~~ SOPN: 0.5000 mg | PEN_INJECTOR | SUBCUTANEOUS | 3 refills | Status: DC

## 2022-08-10 NOTE — Assessment & Plan Note (Addendum)
deteriorate.  Pt hates taking the Trulicity weekly.  Dreads how it makes her feel from Monday-Friday.  Only has 2 days that she feels good before she needs to take her next dose.  Is having symptomatic lows which she's never had before.  Waking up in the middle of the night hungry- which she's never experienced.  Was previously on Ozempic and did well- did not have any of these issues.  We had to switch based on insurance requirements.  Pt would like to try and go back to Ozempic.  If insurance won't cover, she is agreeable to try Jardiance- which we discussed at length today.  Check A1C and determine how to proceed.  Pt expressed understanding and is in agreement w/ plan.

## 2022-08-10 NOTE — Assessment & Plan Note (Signed)
Check labs.  Replete prn. 

## 2022-08-10 NOTE — Progress Notes (Signed)
   Subjective:    Patient ID: Jamie Hall, female    DOB: 06-30-1966, 57 y.o.   MRN: [id number]  HPI Diabetes- 'i hate the Trulicity'.  Pt injects on Monday and is symptomatic Tuesday through Thursday w/ low sugars.  Also on Metformin '500mg'$  BID.  Did not have this issue on Ozempic- was able to tolerate that w/o difficulty.  No CP, SOB, abd pain, N/V.  No numbness/tingling of hands/feet.   Review of Systems For ROS see HPI     Objective:   Physical Exam Vitals reviewed.  Constitutional:      General: She is not in acute distress.    Appearance: Normal appearance. She is well-developed. She is not ill-appearing.  HENT:     Head: Normocephalic and atraumatic.  Eyes:     Conjunctiva/sclera: Conjunctivae normal.     Pupils: Pupils are equal, round, and reactive to light.  Neck:     Thyroid: No thyromegaly.  Cardiovascular:     Rate and Rhythm: Normal rate and regular rhythm.     Pulses: Normal pulses.     Heart sounds: Normal heart sounds. No murmur heard. Pulmonary:     Effort: Pulmonary effort is normal. No respiratory distress.     Breath sounds: Normal breath sounds.  Abdominal:     General: There is no distension.     Palpations: Abdomen is soft.     Tenderness: There is no abdominal tenderness.  Musculoskeletal:     Cervical back: Normal range of motion and neck supple.     Right lower leg: No edema.     Left lower leg: No edema.  Lymphadenopathy:     Cervical: No cervical adenopathy.  Skin:    General: Skin is warm and dry.  Neurological:     General: No focal deficit present.     Mental Status: She is alert and oriented to person, place, and time.  Psychiatric:        Mood and Affect: Mood normal.        Behavior: Behavior normal.        Thought Content: Thought content normal.          Assessment & Plan:

## 2022-08-10 NOTE — Patient Instructions (Addendum)
Schedule your complete physical for March or April We'll notify you of your lab results and make any changes if needed STOP the Trulicity We'll try and get the Ozempic approved but if not, we'll try the Jardiance Call with any questions or concerns Stay Safe!  Stay Healthy! Happy New Year!!!

## 2022-08-11 ENCOUNTER — Telehealth: Payer: Self-pay

## 2022-08-11 NOTE — Telephone Encounter (Signed)
Pt seen results Via my chart  

## 2022-08-11 NOTE — Telephone Encounter (Signed)
-----  Message from Midge Minium, MD sent at 08/11/2022  7:34 AM EST ----- Thankfully labs look great!!!  Now we just have to get you feeling that way!

## 2022-08-13 ENCOUNTER — Other Ambulatory Visit: Payer: Self-pay | Admitting: Family Medicine

## 2022-08-13 ENCOUNTER — Telehealth: Payer: Self-pay | Admitting: Family Medicine

## 2022-08-13 NOTE — Telephone Encounter (Signed)
Caller name: JUNICE FEI  On DPR?: Yes  Call back number: [phone] (mobile)  Provider they see: Midge Minium, MD  Reason for call: Patient is currently at the pharmacy. Patient wants to know the status on her PA on Ozempic 0.5 mg.   I do see a request from patient's pharmacy for Trulicity. Patient states that Trulicity makes her very sick.  Patient states that Dr.Tabori told her if her insurance doesn't approve her Ozempic, they would try Mounjaro.

## 2022-08-13 NOTE — Telephone Encounter (Signed)
Status of PA Ozempic?

## 2022-08-14 NOTE — Telephone Encounter (Signed)
Patient called again to get a update on her  Ozempic. Please contact patient about the status of her medication. Thanks

## 2022-08-19 NOTE — Telephone Encounter (Signed)
Patient called back again for a update on her Ozempic

## 2022-08-21 ENCOUNTER — Other Ambulatory Visit: Payer: Self-pay

## 2022-08-21 ENCOUNTER — Other Ambulatory Visit: Payer: Self-pay | Admitting: Family Medicine

## 2022-08-21 MED ORDER — OZEMPIC (0.25 OR 0.5 MG/DOSE) 2 MG/3ML ~~LOC~~ SOPN: 0.5000 mg | PEN_INJECTOR | SUBCUTANEOUS | 3 refills | Status: DC

## 2022-08-21 NOTE — Telephone Encounter (Signed)
Patient called back to check in again on her PA for ozempic. I reached out to the PA coordinator on teams. No response as of yet, but I did look to see what her recent meds were. I verified with Dr Birdie Riddle that the [place] had not been sent in and I believe that's the issue with the PA. Dr Birdie Riddle is sending in the [place] today! I attempted to call patient to let her know this info, I had to leave a vm.

## 2022-08-21 NOTE — Telephone Encounter (Signed)
Patient returned my call and I let her know that I have submitted a PA because I did not see one on covermymeds initially. Per the PA coordinator, Rachael, patient must try Victoza and Trulicity first. I am still waiting on the decision from insurance on the [place]. Patient stated that she would wait until Monday to see what happens and go from there.

## 2022-08-21 NOTE — Telephone Encounter (Signed)
Pt is not able to take the Trulicity it makes her sick to her stomach

## 2022-08-21 NOTE — Telephone Encounter (Signed)
Thanks! Claiborne Billings CMA messaged that she resubmitted PA. Awaiting response.

## 2022-08-21 NOTE — Telephone Encounter (Signed)
[employer] is currently down.  Reviewing previous Ozempic denial from [facility] (scanned into Media) - they require patient try, fail, or have contraindication to both formulary alternative- Victoza and Trulicity.  I do not see where patient has tried Victoza, does she have any clinical reason she is unable to trial?

## 2022-08-24 ENCOUNTER — Other Ambulatory Visit: Payer: Self-pay

## 2022-08-24 ENCOUNTER — Telehealth: Payer: Self-pay

## 2022-08-24 MED ORDER — EMPAGLIFLOZIN 10 MG PO TABS: 10.0000 mg | ORAL_TABLET | Freq: Every day | ORAL | 1 refills | Status: DC

## 2022-08-24 MED ORDER — VICTOZA 18 MG/3ML ~~LOC~~ SOPN: 1.2000 mg | PEN_INJECTOR | Freq: Every day | SUBCUTANEOUS | 3 refills | Status: DC

## 2022-08-24 NOTE — Telephone Encounter (Signed)
Called patient to let her know that her ozempic was denied. She wanted to know if Dr Birdie Riddle could go ahead and send in the [place]. She is flying out today to go back to [place], [facility] [address]

## 2022-08-24 NOTE — Telephone Encounter (Signed)
Since the Jardiance was too expensive, we sent in the daily Victoza in hopes of getting future restricted GLP1 medications approved.

## 2022-08-24 NOTE — Telephone Encounter (Signed)
Per Claiborne Billings this has been discussed

## 2022-08-26 ENCOUNTER — Encounter: Payer: Self-pay | Admitting: Family Medicine

## 2022-08-28 NOTE — Telephone Encounter (Signed)
I have made multiple phone calls to patient's insurance and stayed in contact with the patient as well. Per her insurance company the Jamie Hall is [phone] dollars due to her deductible and the deductible has to be met before they will cover her meds for a $0 copay. If she tries the victoza and has a contraindication then we can do a PA for ozempic. I called patient and informed her of all of this, she understood

## 2022-09-15 ENCOUNTER — Encounter: Payer: 59 | Admitting: Family Medicine

## 2022-09-26 ENCOUNTER — Other Ambulatory Visit: Payer: Self-pay | Admitting: Family Medicine

## 2022-09-28 ENCOUNTER — Ambulatory Visit (INDEPENDENT_AMBULATORY_CARE_PROVIDER_SITE_OTHER): Payer: 59 | Admitting: Family Medicine

## 2022-09-28 ENCOUNTER — Encounter: Payer: Self-pay | Admitting: Family Medicine

## 2022-09-28 VITALS — BP 126/68 | HR 74 | Resp 18 | Ht 63.0 in | Wt 224.0 lb

## 2022-09-28 DIAGNOSIS — E559 Vitamin D deficiency, unspecified: Secondary | ICD-10-CM

## 2022-09-28 DIAGNOSIS — Z0000 Encounter for general adult medical examination without abnormal findings: Secondary | ICD-10-CM

## 2022-09-28 DIAGNOSIS — E119 Type 2 diabetes mellitus without complications: Secondary | ICD-10-CM | POA: Diagnosis not present

## 2022-09-28 LAB — BASIC METABOLIC PANEL
BUN: 16 mg/dL (ref 6–23)
CO2: 29 mEq/L (ref 19–32)
Calcium: 9.5 mg/dL (ref 8.4–10.5)
Chloride: 105 mEq/L (ref 96–112)
Creatinine, Ser: 0.79 mg/dL (ref 0.40–1.20)
GFR: 84.62 mL/min (ref >60.00)
Glucose, Bld: 104 mg/dL — ABNORMAL HIGH (ref 70–99)
Potassium: 4.2 mEq/L (ref 3.5–5.1)
Sodium: 141 mEq/L (ref 135–145)

## 2022-09-28 LAB — HEMOGLOBIN A1C: Hgb A1c MFr Bld: 6.8 % — ABNORMAL HIGH (ref 4.6–6.5)

## 2022-09-28 MED ORDER — DEXCOM G6 SENSOR MISC: 3 refills | Status: DC

## 2022-09-28 NOTE — Assessment & Plan Note (Signed)
BMI is 39.68 and coupled w/ her diabetes, hyperlipidemia, and HTN this qualifies as morbidly obese.  Encouraged low carb diet and regular exercise.  Will continue to follow.

## 2022-09-28 NOTE — Assessment & Plan Note (Signed)
Reviewed recent Vit D lab.  No need to repeat

## 2022-09-28 NOTE — Progress Notes (Signed)
   Subjective:    Patient ID: Jamie Hall, female    DOB: 1965-07-29, 57 y.o.   MRN: [id number]  HPI CPE- UTD on eye exam, foot exam, microalbumin.  UTD on pap, colonoscopy.  Due for mammo- scheduled  Patient Care Team    Relationship Specialty Notifications Start End  Midge Minium, MD PCP - General Family Medicine  01/17/18   Key, Nelia Shi, NP Nurse Practitioner Gynecology  02/03/19      Health Maintenance  Topic Date Due   MAMMOGRAM  08/20/2022   Diabetic kidney evaluation - Urine ACR  10/31/2022   OPHTHALMOLOGY EXAM  12/11/2022   HEMOGLOBIN A1C  02/08/2023   Diabetic kidney evaluation - eGFR measurement  08/11/2023   FOOT EXAM  08/11/2023   PAP SMEAR-Modifier  08/22/2023   COLONOSCOPY (Pts 45-40yr Insurance coverage will need to be confirmed)  05/16/2026   INFLUENZA VACCINE  Completed   Hepatitis C Screening  Completed   HIV Screening  Completed   HPV VACCINES  Aged Out   DTaP/Tdap/Td  Discontinued   COVID-19 Vaccine  Discontinued   Zoster Vaccines- Shingrix  Discontinued     Review of Systems Patient reports no vision/ hearing changes, adenopathy,fever, weight change,  persistant/recurrent hoarseness , swallowing issues, chest pain, palpitations, edema, persistant/recurrent cough, hemoptysis, dyspnea (rest/exertional/paroxysmal nocturnal), gastrointestinal bleeding (melena, rectal bleeding), abdominal pain, significant heartburn, bowel changes, GU symptoms (dysuria, hematuria, incontinence), Gyn symptoms (abnormal  bleeding, pain),  syncope, focal weakness, memory loss, numbness & tingling, skin/hair/nail changes, abnormal bruising or bleeding, anxiety, or depression.   Pt tried and did not tolerate Trulicity.  Has been taking Jardiance instead.  Weight is increasing.  Tolerated Ozempic w/o dificulty    Objective:   Physical Exam General Appearance:    Alert, cooperative, no distress, appears stated age  Head:    Normocephalic, without obvious abnormality,  atraumatic  Eyes:    PERRL, conjunctiva/corneas clear, EOM's intact both eyes  Ears:    Normal TM's and external ear canals, both ears  Nose:   Nares normal, septum midline, mucosa normal, no drainage    or sinus tenderness  Throat:   Lips, mucosa, and tongue normal; teeth and gums normal  Neck:   Supple, symmetrical, trachea midline, no adenopathy;    Thyroid: no enlargement/tenderness/nodules  Back:     Symmetric, no curvature, ROM normal, no CVA tenderness  Lungs:     Clear to auscultation bilaterally, respirations unlabored  Chest Wall:    No tenderness or deformity   Heart:    Regular rate and rhythm, S1 and S2 normal, no murmur, rub   or gallop  Breast Exam:    Deferred to GYN  Abdomen:     Soft, non-tender, bowel sounds active all four quadrants,    no masses, no organomegaly  Genitalia:    Deferred to GYN  Rectal:    Extremities:   Extremities normal, atraumatic, no cyanosis or edema  Pulses:   2+ and symmetric all extremities  Skin:   Skin color, texture, turgor normal, no rashes or lesions  Lymph nodes:   Cervical, supraclavicular, and axillary nodes normal  Neurologic:   CNII-XII intact, normal strength, sensation and reflexes    throughout          Assessment & Plan:

## 2022-09-28 NOTE — Assessment & Plan Note (Signed)
Pt interested in CGM.  Order sent to pharmacy.  Check labs.  Adjust meds prn

## 2022-09-28 NOTE — Patient Instructions (Signed)
Follow up in 3-4 months to recheck diabetes We'll notify you of your lab results and make any changes if needed Continue to work on healthy diet and regular exercise- you can do it!!! Call with any questions or concerns Stay Safe!  Stay Healthy!!

## 2022-09-29 ENCOUNTER — Telehealth: Payer: Self-pay

## 2022-09-29 IMAGING — CT CT RENAL STONE PROTOCOL
2 of 4 series · 16 of 46 positions shown, 18 images · non-contrast
Comparison: CT abdomen pelvis 10/23/2015

CLINICAL DATA: Pt presents with c/o left lower quadrant abdominal
pain. Pt reports she has a hx of kidney stones. Pt has some blood in
her urine but reports that this is normal for her. Pt is diaphoretic
during triage, pain [DATE].

EXAM:
CT ABDOMEN AND PELVIS WITHOUT CONTRAST
TECHNIQUE: Multidetector CT imaging of the abdomen and pelvis was performed
following the standard protocol without IV contrast.

[Series 2: axial st · axial · 0.84mm/px · z∈[+948,+1378]mm · 13 of 98 slices shown, 15 images]
[im 6/98  soft-tissue]
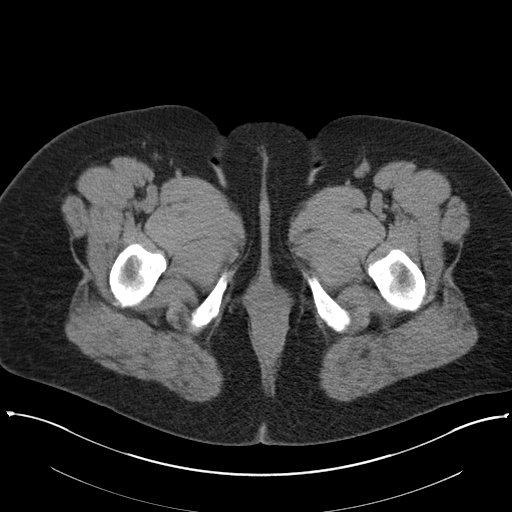
[im 6/98  bone]
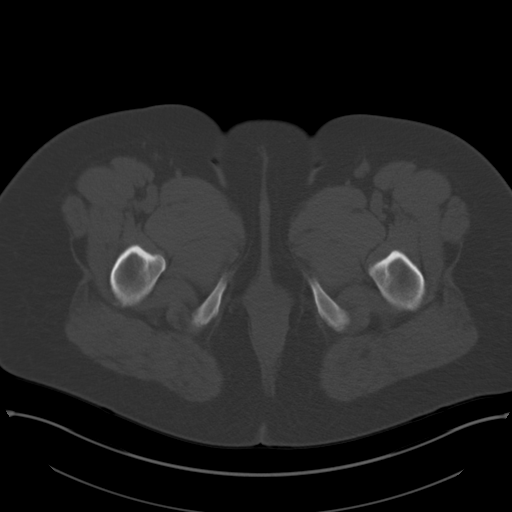
[im 16/98  soft-tissue]
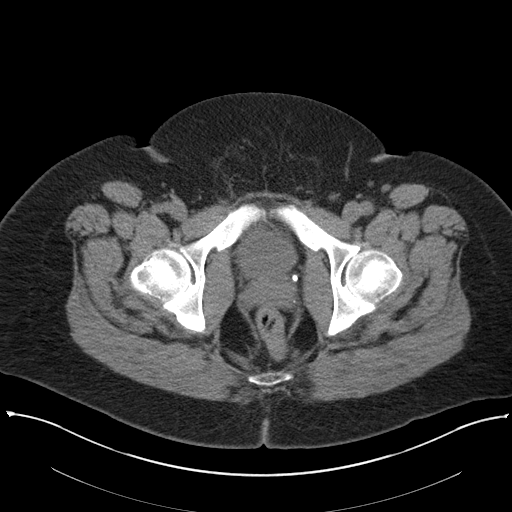
[im 21/98  soft-tissue]
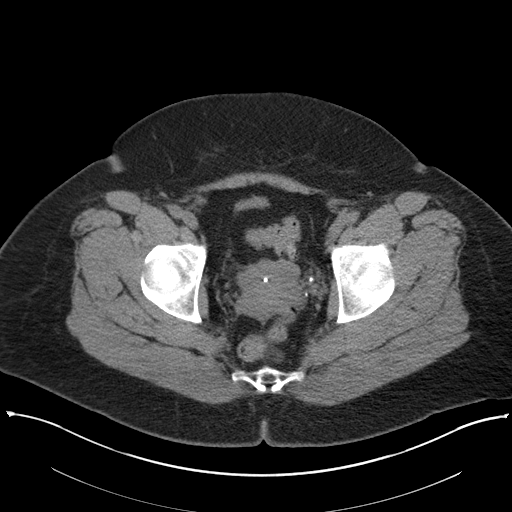
[im 26/98  soft-tissue]
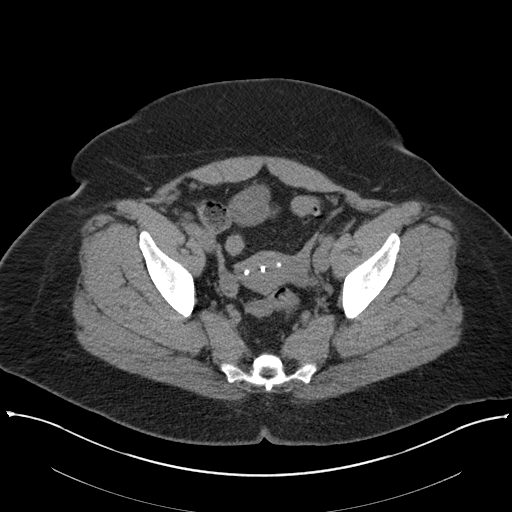
[im 36/98  soft-tissue]
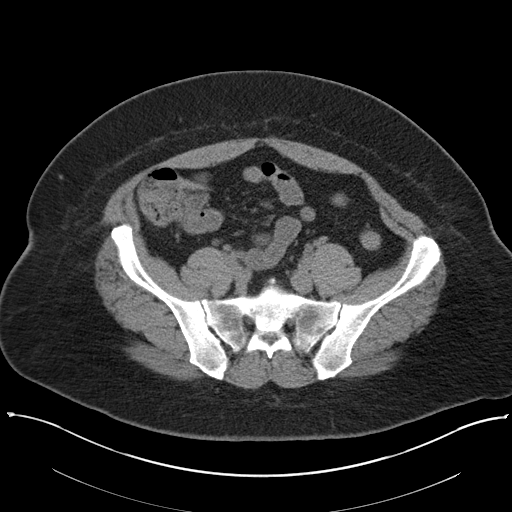
[im 41/98  soft-tissue]
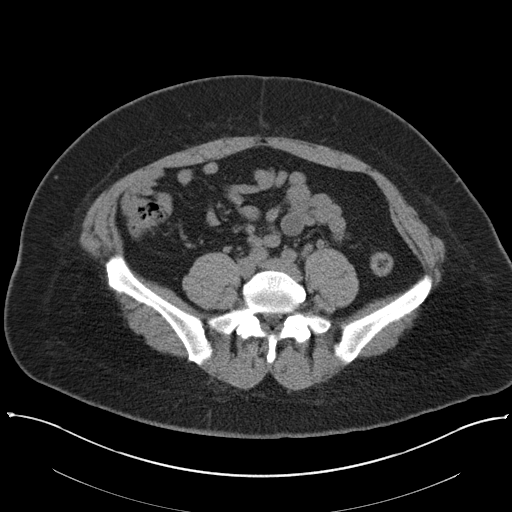
[im 52/98  soft-tissue]
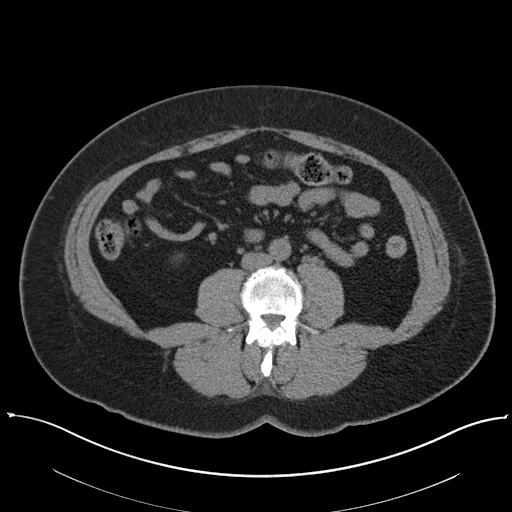
[im 57/98  soft-tissue]
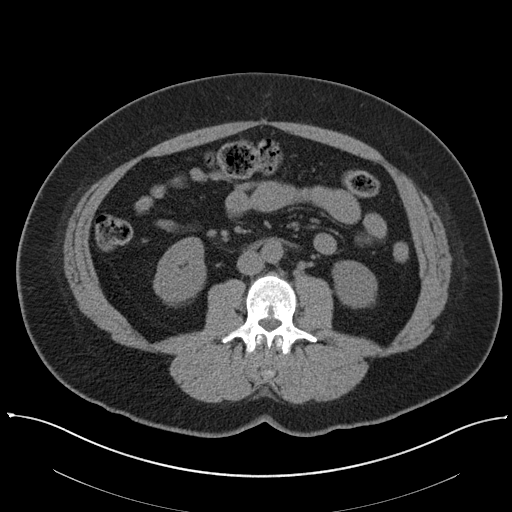
[im 62/98  soft-tissue]
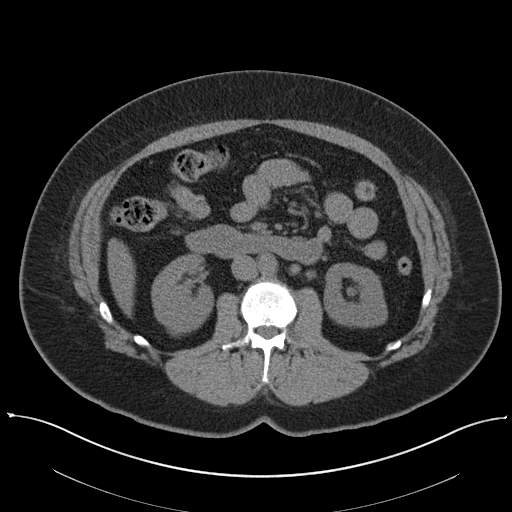
[im 62/98  bone]
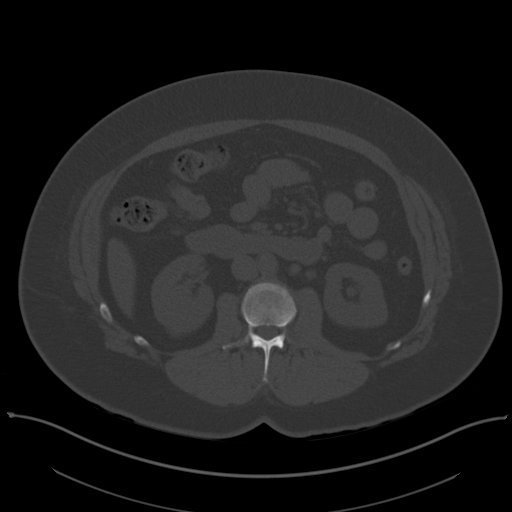
[im 72/98  soft-tissue]
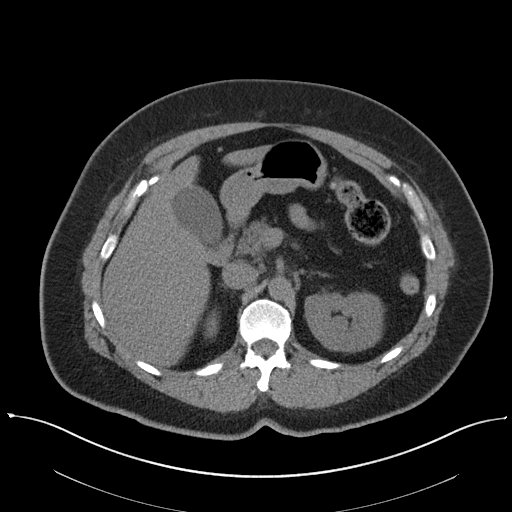
[im 77/98  soft-tissue]
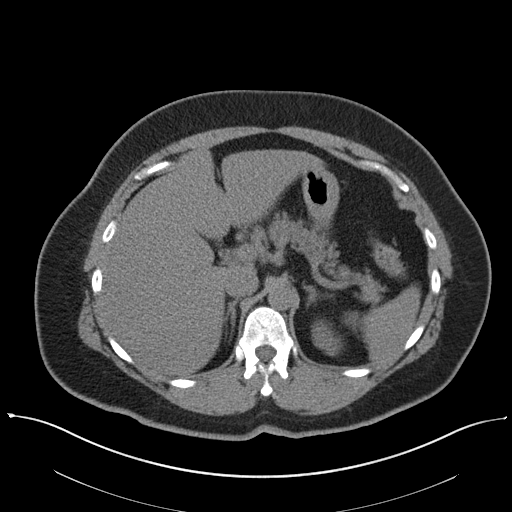
[im 82/98  soft-tissue]
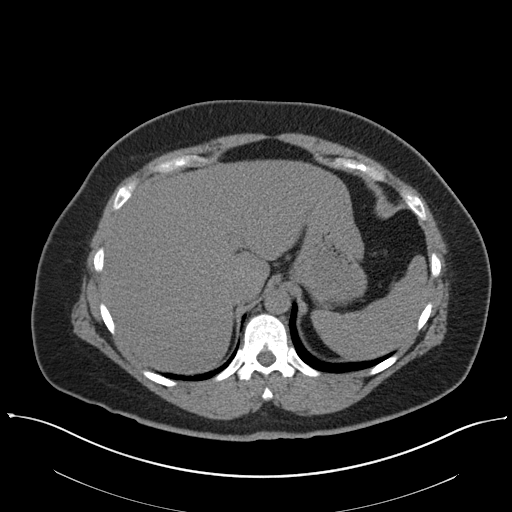
[im 92/98  soft-tissue]
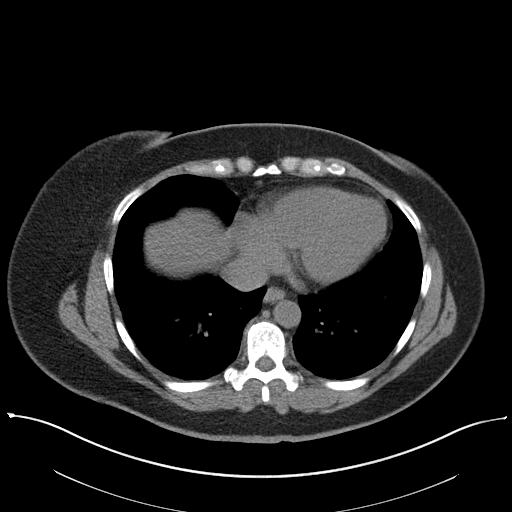

[Series 5: coronal · coronal · 0.82mm/px · 3 of 151 slices shown]
[im 51/151  soft-tissue]
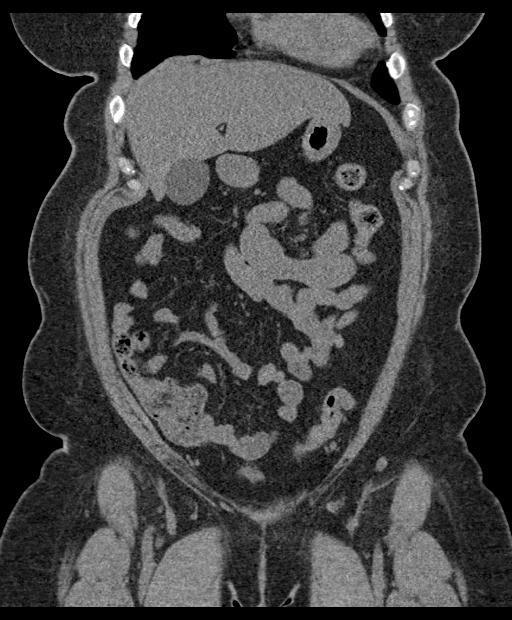
[im 67/151  soft-tissue]
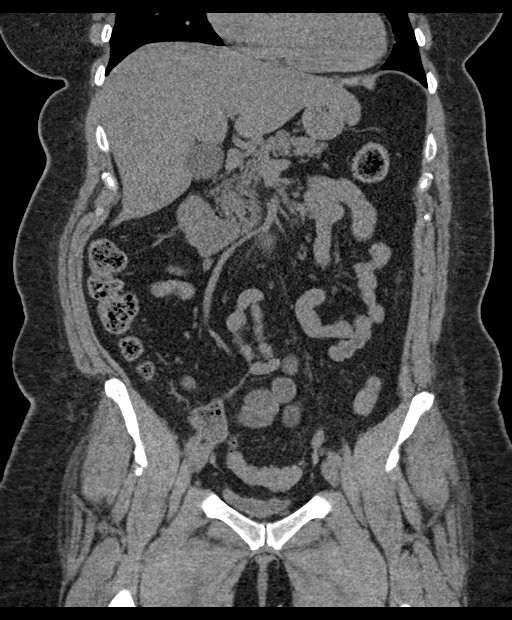
[im 84/151  soft-tissue]
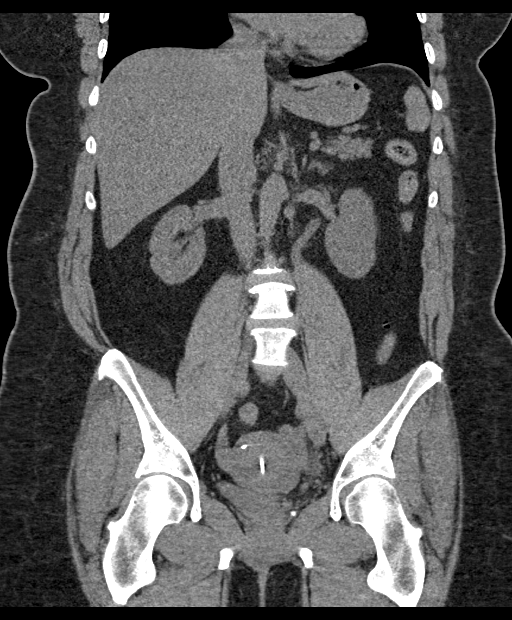

[16 of 46 positions shown; findings below may reference images not displayed]

FINDINGS: Lower chest: No acute abnormality.

Evaluation of the abdominal viscera somewhat limited by the lack of
IV contrast.

Hepatobiliary: No focal liver abnormality is seen. No gallstones,
gallbladder wall thickening, or biliary dilatation.

Pancreas: Unremarkable.

Spleen: Normal in size.

Adrenals/Urinary Tract: Normal right adrenal gland. Stable 1.1 cm
nodule in the left adrenal gland, most likely an adenoma. Left renal
cysts. Mild left hydronephrosis and hydroureter secondary to a 7 mm
calculus in the distal left ureter. No right hydronephrosis or renal
calculi. Unremarkable urinary bladder.

Stomach/Bowel: Stomach is within normal limits. No evidence of bowel
wall thickening, distention, or inflammatory changes. Colonic
diverticula.

Vascular/Lymphatic: No significant vascular findings are present. No
enlarged abdominal or pelvic lymph nodes.

Reproductive: IUD is present within the uterus.  No adnexal masses.

Other: No abdominal wall hernia or abnormality. No abdominopelvic
ascites.

Musculoskeletal: No acute or significant osseous findings.
IMPRESSION: Mild left hydronephrosis and hydroureter secondary to a 7 mm
calculus in the distal left ureter.

## 2022-09-29 NOTE — Telephone Encounter (Signed)
-----   Message from Midge Minium, MD sent at 09/29/2022  7:24 AM EST ----- Labs look good!  No changes at this time

## 2022-09-29 NOTE — Telephone Encounter (Signed)
Informed pt of lab results  

## 2022-09-30 ENCOUNTER — Telehealth: Payer: Self-pay | Admitting: Family Medicine

## 2022-09-30 MED ORDER — DEXCOM G6 RECEIVER DEVI: 0 refills | Status: DC

## 2022-09-30 MED ORDER — DEXCOM G6 TRANSMITTER MISC: 0 refills | Status: DC

## 2022-09-30 NOTE — Telephone Encounter (Signed)
We sent in the Rx for the freestyle sensor no meter can we redo the RX  per pharmacy

## 2022-09-30 NOTE — Addendum Note (Signed)
Addended by: Midge Minium on: 09/30/2022 03:24 PM   Modules accepted: Orders

## 2022-09-30 NOTE — Telephone Encounter (Signed)
Caller name: DAWANA CORALES  On DPR?: Yes  Call back number: [phone] (home)  Provider they see: Midge Minium, MD  Reason for call: We ordered the Glucose monitor  without the transmitter. We need that -advise

## 2022-09-30 NOTE — Telephone Encounter (Signed)
Sent in prescriptions for receiver and transmitter

## 2022-10-01 ENCOUNTER — Encounter: Payer: Self-pay | Admitting: Radiology

## 2022-10-11 ENCOUNTER — Other Ambulatory Visit: Payer: Self-pay | Admitting: Family Medicine

## 2022-10-13 ENCOUNTER — Other Ambulatory Visit (HOSPITAL_COMMUNITY): Payer: Self-pay | Admitting: Chiropractic Medicine

## 2022-10-13 DIAGNOSIS — S335XXA Sprain of ligaments of lumbar spine, initial encounter: Secondary | ICD-10-CM

## 2022-10-16 ENCOUNTER — Encounter (HOSPITAL_COMMUNITY): Payer: Self-pay

## 2022-10-16 ENCOUNTER — Ambulatory Visit (HOSPITAL_COMMUNITY): Payer: 59

## 2022-10-26 ENCOUNTER — Telehealth: Payer: Self-pay | Admitting: Family Medicine

## 2022-10-26 ENCOUNTER — Other Ambulatory Visit: Payer: Self-pay

## 2022-10-26 MED ORDER — EMPAGLIFLOZIN 10 MG PO TABS: 10.0000 mg | ORAL_TABLET | Freq: Every day | ORAL | 1 refills | Status: DC

## 2022-10-26 MED ORDER — DEXCOM G6 SENSOR MISC: 3 refills | Status: DC

## 2022-10-26 NOTE — Telephone Encounter (Signed)
Encourage patient to contact the pharmacy for refills or they can request refills through [facility]  (Please schedule appointment if patient has not been seen in over a year)    WHAT PHARMACY WOULD THEY LIKE THIS SENT TO:  [id number] [place], [place] - [address] AT [place] Blood Gluc Sensor (DEXCOM G6 SENSOR) MISC and  empagliflozin (JARDIANCE) 10 MG TABS tablet  NOTES/COMMENTS FROM PATIENT:      [place] office please notify patient: It takes 48-72 hours to process rx refill requests Ask patient to call pharmacy to ensure rx is ready before heading there.

## 2022-10-26 NOTE — Telephone Encounter (Signed)
Refills has been sent in to requested pharmacy

## 2022-11-12 ENCOUNTER — Other Ambulatory Visit: Payer: Self-pay

## 2022-11-12 MED ORDER — DEXCOM G6 SENSOR MISC: 3 refills | Status: DC

## 2022-11-12 MED ORDER — DEXCOM G6 TRANSMITTER MISC: 0 refills | Status: DC

## 2022-11-14 ENCOUNTER — Other Ambulatory Visit: Payer: Self-pay | Admitting: Family Medicine

## 2022-11-18 ENCOUNTER — Encounter: Payer: 59 | Admitting: Family Medicine

## 2022-11-20 DIAGNOSIS — M48062 Spinal stenosis, lumbar region with neurogenic claudication: Secondary | ICD-10-CM | POA: Diagnosis not present

## 2022-12-08 DIAGNOSIS — Z780 Asymptomatic menopausal state: Secondary | ICD-10-CM | POA: Diagnosis not present

## 2022-12-08 DIAGNOSIS — Z30431 Encounter for routine checking of intrauterine contraceptive device: Secondary | ICD-10-CM | POA: Diagnosis not present

## 2022-12-08 DIAGNOSIS — Z1231 Encounter for screening mammogram for malignant neoplasm of breast: Secondary | ICD-10-CM | POA: Diagnosis not present

## 2022-12-08 DIAGNOSIS — Z01419 Encounter for gynecological examination (general) (routine) without abnormal findings: Secondary | ICD-10-CM | POA: Diagnosis not present

## 2022-12-08 DIAGNOSIS — Z130 Encounter for screening for diseases of the blood and blood-forming organs and certain disorders involving the immune mechanism: Secondary | ICD-10-CM | POA: Diagnosis not present

## 2022-12-08 DIAGNOSIS — Z1389 Encounter for screening for other disorder: Secondary | ICD-10-CM | POA: Diagnosis not present

## 2022-12-08 LAB — HM MAMMOGRAPHY

## 2022-12-24 ENCOUNTER — Telehealth: Payer: Self-pay | Admitting: Family Medicine

## 2022-12-24 DIAGNOSIS — Z01818 Encounter for other preprocedural examination: Secondary | ICD-10-CM | POA: Diagnosis not present

## 2022-12-24 DIAGNOSIS — M545 Low back pain, unspecified: Secondary | ICD-10-CM | POA: Diagnosis not present

## 2022-12-24 MED ORDER — GABAPENTIN 300 MG PO CAPS: 300.0000 mg | ORAL_CAPSULE | Freq: Three times a day (TID) | ORAL | 3 refills | Status: DC

## 2022-12-24 NOTE — Telephone Encounter (Signed)
Left pt a vm stating Rx has been sent

## 2022-12-24 NOTE — Telephone Encounter (Signed)
Pt did not like her neurologist and does not want to go back . She is asking if you could take over refilling her Gabapentin

## 2022-12-24 NOTE — Telephone Encounter (Signed)
I am happy to refill the gabapentin.  It has fallen off her med list so I just need to know how much she is taking and how often (nightly vs BID vs TID)

## 2022-12-24 NOTE — Telephone Encounter (Signed)
Patient called and would like to know if Dr. Beverely Low can continue to prescribe Gabapentin, as it was prescribed by the neurology but states she will not go back to that provider as she did not like him.  Please call

## 2022-12-24 NOTE — Telephone Encounter (Signed)
Prescription sent

## 2022-12-24 NOTE — Telephone Encounter (Signed)
Gabapentin 300 mg TID is what she was given by Neurologist  [facility] [address]

## 2022-12-25 ENCOUNTER — Telehealth: Payer: Self-pay | Admitting: Family Medicine

## 2022-12-25 ENCOUNTER — Other Ambulatory Visit: Payer: Self-pay | Admitting: Family Medicine

## 2022-12-25 MED ORDER — DEXCOM G6 SENSOR MISC: 3 refills | Status: DC

## 2022-12-25 NOTE — Telephone Encounter (Signed)
Patient went to pharmacy and they told her there is no RX for dexcom. Please call.

## 2022-12-25 NOTE — Telephone Encounter (Signed)
[facility] [address]

## 2022-12-25 NOTE — Telephone Encounter (Signed)
Called and confirmed [facility] [address], sent to the pharmacy and informed patient while I was on the phone with her

## 2022-12-29 ENCOUNTER — Ambulatory Visit: Payer: 59 | Admitting: Family Medicine

## 2022-12-31 ENCOUNTER — Other Ambulatory Visit: Payer: Self-pay | Admitting: Neurosurgery

## 2023-01-09 ENCOUNTER — Other Ambulatory Visit: Payer: Self-pay | Admitting: Orthopaedic Surgery

## 2023-01-15 ENCOUNTER — Encounter: Payer: Self-pay | Admitting: Family Medicine

## 2023-01-15 ENCOUNTER — Ambulatory Visit: Payer: 59 | Admitting: Family Medicine

## 2023-01-15 VITALS — BP 122/80 | HR 71 | Temp 97.8°F | Resp 18 | Ht 63.0 in | Wt 222.5 lb

## 2023-01-15 DIAGNOSIS — E119 Type 2 diabetes mellitus without complications: Secondary | ICD-10-CM

## 2023-01-15 DIAGNOSIS — Z7984 Long term (current) use of oral hypoglycemic drugs: Secondary | ICD-10-CM | POA: Diagnosis not present

## 2023-01-15 LAB — HEMOGLOBIN A1C: Hgb A1c MFr Bld: 6.9 % — ABNORMAL HIGH (ref 4.6–6.5)

## 2023-01-15 LAB — BASIC METABOLIC PANEL
BUN: 16 mg/dL (ref 6–23)
CO2: 31 mEq/L (ref 19–32)
Calcium: 9.8 mg/dL (ref 8.4–10.5)
Chloride: 103 mEq/L (ref 96–112)
Creatinine, Ser: 0.77 mg/dL (ref 0.40–1.20)
GFR: 87.08 mL/min (ref >60.00)
Glucose, Bld: 111 mg/dL — ABNORMAL HIGH (ref 70–99)
Potassium: 4.1 mEq/L (ref 3.5–5.1)
Sodium: 141 mEq/L (ref 135–145)

## 2023-01-15 LAB — MICROALBUMIN / CREATININE URINE RATIO
Creatinine,U: 191.2 mg/dL
Microalb Creat Ratio: 0.5 mg/g (ref 0.0–30.0)
Microalb, Ur: 1 mg/dL (ref 0.0–1.9)

## 2023-01-15 NOTE — Assessment & Plan Note (Signed)
Chronic problem for pt.  Currently on Jardiance 10mg  daily and Metformin 500mg  BID.  UTD on eye exam, foot exam.  Microalbumin ordered.  Currently asymptomatic.  Check labs.  Adjust meds prn

## 2023-01-15 NOTE — Patient Instructions (Signed)
Follow up in 3-4 months to recheck diabetes, blood pressure, and cholesterol We'll notify you of your lab results and make any changes if needed Continue to work on low carb diet and physical activity as you are able Call with any questions or concerns [employer]!!!

## 2023-01-15 NOTE — Progress Notes (Signed)
   Subjective:    Patient ID: Jamie Hall, female    DOB: 01-Dec-1965, 57 y.o.   MRN: [id number]  HPI DM- chronic problem, on Jardiance 10mg  daily, Metformin 500mg  BID.  UTD on eye exam- will get records.  UTD on foot exam.  Due for microalbumin.  No CP, SOB, HA's, visual changes, abd pain, N/V.  Rare symptomatic lows.  + leg numbness due to back issues.   Review of Systems For ROS see HPI     Objective:   Physical Exam Vitals reviewed.  Constitutional:      General: She is not in acute distress.    Appearance: Normal appearance. She is well-developed. She is not ill-appearing.  HENT:     Head: Normocephalic and atraumatic.  Eyes:     Conjunctiva/sclera: Conjunctivae normal.     Pupils: Pupils are equal, round, and reactive to light.  Neck:     Thyroid: No thyromegaly.  Cardiovascular:     Rate and Rhythm: Normal rate and regular rhythm.     Pulses: Normal pulses.     Heart sounds: Normal heart sounds. No murmur heard. Pulmonary:     Effort: Pulmonary effort is normal. No respiratory distress.     Breath sounds: Normal breath sounds.  Abdominal:     General: There is no distension.     Palpations: Abdomen is soft.     Tenderness: There is no abdominal tenderness.  Musculoskeletal:     Cervical back: Normal range of motion and neck supple.     Right lower leg: No edema.     Left lower leg: No edema.  Lymphadenopathy:     Cervical: No cervical adenopathy.  Skin:    General: Skin is warm and dry.  Neurological:     General: No focal deficit present.     Mental Status: She is alert and oriented to person, place, and time.  Psychiatric:        Mood and Affect: Mood normal.        Behavior: Behavior normal.        Thought Content: Thought content normal.           Assessment & Plan:

## 2023-01-18 ENCOUNTER — Telehealth: Payer: Self-pay

## 2023-01-18 NOTE — Telephone Encounter (Signed)
-----   Message from Sheliah Hatch, MD sent at 01/16/2023  9:31 AM EDT ----- A1C is stable.  Continue to work on low carb/low sugar diet.  You can do it!!

## 2023-01-18 NOTE — Telephone Encounter (Signed)
Pt seen results via my chart  

## 2023-01-25 DIAGNOSIS — M2550 Pain in unspecified joint: Secondary | ICD-10-CM | POA: Insufficient documentation

## 2023-01-25 DIAGNOSIS — N200 Calculus of kidney: Secondary | ICD-10-CM | POA: Insufficient documentation

## 2023-01-25 DIAGNOSIS — R200 Anesthesia of skin: Secondary | ICD-10-CM | POA: Insufficient documentation

## 2023-02-02 ENCOUNTER — Encounter: Payer: Self-pay | Admitting: Family Medicine

## 2023-02-02 ENCOUNTER — Encounter: Payer: Self-pay | Admitting: Gynecology

## 2023-02-23 DIAGNOSIS — Z6837 Body mass index (BMI) 37.0-37.9, adult: Secondary | ICD-10-CM | POA: Diagnosis not present

## 2023-02-23 DIAGNOSIS — M4316 Spondylolisthesis, lumbar region: Secondary | ICD-10-CM | POA: Diagnosis not present

## 2023-03-22 ENCOUNTER — Other Ambulatory Visit: Payer: Self-pay | Admitting: Family Medicine

## 2023-03-25 NOTE — Progress Notes (Signed)
Surgical Instructions   Your procedure is scheduled on Thursday April 08, 2023. Report to [facility] Main Entrance "A" at 5:30 A.M., then check in with the Admitting office. Any questions or running late day of surgery: call [phone]  Questions prior to your surgery date: call [phone], Monday-Friday, 8am-4pm. If you experience any cold or flu symptoms such as cough, fever, chills, shortness of breath, etc. between now and your scheduled surgery, please notify us at the above number.     Remember:  Do not eat or dink after midnight the night before your surgery  Take these medicines the morning of surgery with A SIP OF WATER  pregabalin (LYRICA)   May take these medicines IF NEEDED: lansoprazole (PREVACID)    One week prior to surgery, STOP taking any Aspirin (unless otherwise instructed by your surgeon) Aleve, Naproxen, Ibuprofen, Motrin, Advil, Goody's, BC's, all herbal medications, fish oil, and non-prescription vitamins.  This includes yourmeloxicam (MOBIC).     WHAT DO I DO ABOUT MY DIABETES MEDICATION?   Do not take oral diabetes medicines (pills) the morning of surgery.  Hold your empagliflozin (JARDIANCE) 72 hours prior to surgery, with the last dose being 04/04/2023.         DO NOT TAKE YOUR metFORMIN (GLUCOPHAGE) THE MORNING OF SURGERY.     The day of surgery, do not take other diabetes injectables, including Byetta (exenatide), Bydureon (exenatide ER), Victoza (liraglutide), or Trulicity (dulaglutide).  If your CBG is greater than 220 mg/dL, you may take  of your sliding scale (correction) dose of insulin.   HOW TO MANAGE YOUR DIABETES BEFORE AND AFTER SURGERY  Why is it important to control my blood sugar before and after surgery? Improving blood sugar levels before and after surgery helps healing and can limit problems. A way of improving blood sugar control is eating a healthy diet by:  Eating less sugar and carbohydrates  Increasing  activity/exercise  Talking with your doctor about reaching your blood sugar goals High blood sugars (greater than 180 mg/dL) can raise your risk of infections and slow your recovery, so you will need to focus on controlling your diabetes during the weeks before surgery. Make sure that the doctor who takes care of your diabetes knows about your planned surgery including the date and location.  How do I manage my blood sugar before surgery? Check your blood sugar at least 4 times a day, starting 2 days before surgery, to make sure that the level is not too high or low.  Check your blood sugar the morning of your surgery when you wake up and every 2 hours until you get to the Short Stay unit.  If your blood sugar is less than 70 mg/dL, you will need to treat for low blood sugar: Do not take insulin. Treat a low blood sugar (less than 70 mg/dL) with  cup of clear juice (cranberry or apple), 4 glucose tablets, OR glucose gel. Recheck blood sugar in 15 minutes after treatment (to make sure it is greater than 70 mg/dL). If your blood sugar is not greater than 70 mg/dL on recheck, call [phone] for further instructions. Report your blood sugar to the short stay nurse when you get to Short Stay.  If you are admitted to the hospital after surgery: Your blood sugar will be checked by the staff and you will probably be given insulin after surgery (instead of oral diabetes medicines) to make sure you have good blood sugar levels. The goal for blood sugar  control after surgery is 80-180 mg/dL. \                     Do NOT Smoke (Tobacco/Vaping) for 24 hours prior to your procedure.  If you use a CPAP at night, you may bring your mask/headgear for your overnight stay.   You will be asked to remove any contacts, glasses, piercing's, hearing aid's, dentures/partials prior to surgery. Please bring cases for these items if needed.    Patients discharged the day of surgery will not be allowed to drive  home, and someone needs to stay with them for 24 hours.  SURGICAL WAITING ROOM VISITATION Patients may have no more than 2 support people in the waiting area - these visitors may rotate.   Pre-op nurse will coordinate an appropriate time for 1 ADULT support person, who may not rotate, to accompany patient in pre-op.  Children under the age of 34 must have an adult with them who is not the patient and must remain in the main waiting area with an adult.  If the patient needs to stay at the hospital during part of their recovery, the visitor guidelines for inpatient rooms apply.  Please refer to the [facility] website for the visitor guidelines for any additional information.   If you received a COVID test during your pre-op visit  it is requested that you wear a mask when out in public, stay away from anyone that may not be feeling well and notify your surgeon if you develop symptoms. If you have been in contact with anyone that has tested positive in the last 10 days please notify you surgeon.      Pre-operative 5 CHG Bathing Instructions   You can play a key role in reducing the risk of infection after surgery. Your skin needs to be as free of germs as possible. You can reduce the number of germs on your skin by washing with CHG (chlorhexidine gluconate) soap before surgery. CHG is an antiseptic soap that kills germs and continues to kill germs even after washing.   DO NOT use if you have an allergy to chlorhexidine/CHG or antibacterial soaps. If your skin becomes reddened or irritated, stop using the CHG and notify one of our RNs at [phone].   Please shower with the CHG soap starting 4 days before surgery using the following schedule:     Please keep in mind the following:  DO NOT shave, including legs and underarms, starting the day of your first shower.   You may shave your face at any point before/day of surgery.  Place clean sheets on your bed the day you start using CHG  soap. Use a clean washcloth (not used since being washed) for each shower. DO NOT sleep with pets once you start using the CHG.   CHG Shower Instructions:  If you choose to wash your hair and private area, wash first with your normal shampoo/soap.  After you use shampoo/soap, rinse your hair and body thoroughly to remove shampoo/soap residue.  Turn the water OFF and apply about 3 tablespoons (45 ml) of CHG soap to a CLEAN washcloth.  Apply CHG soap ONLY FROM YOUR NECK DOWN TO YOUR TOES (washing for 3-5 minutes)  DO NOT use CHG soap on face, private areas, open wounds, or sores.  Pay special attention to the area where your surgery is being performed.  If you are having back surgery, having someone wash your back for you may be helpful.  Wait 2 minutes after CHG soap is applied, then you may rinse off the CHG soap.  Pat dry with a clean towel  Put on clean clothes/pajamas   If you choose to wear lotion, please use ONLY the CHG-compatible lotions on the back of this paper.   Additional instructions for the day of surgery: DO NOT APPLY any lotions, deodorants or perfumes.   Do not bring valuables to the hospital. [facility] is not responsible for any belongings/valuables. Do not wear nail polish, gel polish, artificial nails, or any other type of covering on natural nails (fingers and toes) Do not wear jewelry or makeup Put on clean/comfortable clothes.  Please brush your teeth.  Ask your nurse before applying any prescription medications to the skin.     CHG Compatible Lotions   Aveeno Moisturizing lotion  Cetaphil Moisturizing Cream  Cetaphil Moisturizing Lotion  Clairol Herbal Essence Moisturizing Lotion, Dry Skin  Clairol Herbal Essence Moisturizing Lotion, Extra Dry Skin  Clairol Herbal Essence Moisturizing Lotion, Normal Skin  Curel Age Defying Therapeutic Moisturizing Lotion with Alpha Hydroxy  Curel Extreme Care Body Lotion  Curel Soothing Hands Moisturizing Hand Lotion   Curel Therapeutic Moisturizing Cream, Fragrance-Free  Curel Therapeutic Moisturizing Lotion, Fragrance-Free  Curel Therapeutic Moisturizing Lotion, Original Formula  Eucerin Daily Replenishing Lotion  Eucerin Dry Skin Therapy Plus Alpha Hydroxy Crme  Eucerin Dry Skin Therapy Plus Alpha Hydroxy Lotion  Eucerin Original Crme  Eucerin Original Lotion  Eucerin Plus Crme Eucerin Plus Lotion  Eucerin TriLipid Replenishing Lotion  Keri Anti-Bacterial Hand Lotion  Keri Deep Conditioning Original Lotion Dry Skin Formula Softly Scented  Keri Deep Conditioning Original Lotion, Fragrance Free Sensitive Skin Formula  Keri Lotion Fast Absorbing Fragrance Free Sensitive Skin Formula  Keri Lotion Fast Absorbing Softly Scented Dry Skin Formula  Keri Original Lotion  Keri Skin Renewal Lotion Keri Silky Smooth Lotion  Keri Silky Smooth Sensitive Skin Lotion  Nivea Body Creamy Conditioning Oil  Nivea Body Extra Enriched Lotion  Nivea Body Original Lotion  Nivea Body Sheer Moisturizing Lotion Nivea Crme  Nivea Skin Firming Lotion  NutraDerm 30 Skin Lotion  NutraDerm Skin Lotion  NutraDerm Therapeutic Skin Cream  NutraDerm Therapeutic Skin Lotion  ProShield Protective Hand Cream  Provon moisturizing lotion  Please read over the following fact sheets that you were given.

## 2023-03-26 ENCOUNTER — Encounter (HOSPITAL_COMMUNITY)
Admission: RE | Admit: 2023-03-26 | Discharge: 2023-03-26 | Disposition: A | Discharge status: Home / Self Care | Payer: 59 | Source: Ambulatory Visit | Attending: Neurosurgery | Admitting: Neurosurgery

## 2023-03-26 ENCOUNTER — Encounter (HOSPITAL_COMMUNITY): Payer: Self-pay

## 2023-03-26 ENCOUNTER — Other Ambulatory Visit: Payer: Self-pay

## 2023-03-26 VITALS — BP 129/84 | HR 79 | Temp 98.0°F | Resp 17 | Ht 63.0 in | Wt 206.8 lb

## 2023-03-26 DIAGNOSIS — E119 Type 2 diabetes mellitus without complications: Secondary | ICD-10-CM | POA: Diagnosis not present

## 2023-03-26 DIAGNOSIS — Z01818 Encounter for other preprocedural examination: Secondary | ICD-10-CM

## 2023-03-26 DIAGNOSIS — Z01812 Encounter for preprocedural laboratory examination: Secondary | ICD-10-CM | POA: Diagnosis not present

## 2023-03-26 DIAGNOSIS — Z01810 Encounter for preprocedural cardiovascular examination: Secondary | ICD-10-CM | POA: Diagnosis not present

## 2023-03-26 LAB — CBC
HCT: 43.9 % (ref 36.0–46.0)
Hemoglobin: 14 g/dL (ref 12.0–15.0)
MCH: 27.8 pg (ref 26.0–34.0)
MCHC: 31.9 g/dL (ref 30.0–36.0)
MCV: 87.1 fL (ref 80.0–100.0)
Platelets: 322 10*3/uL (ref 150–400)
RBC: 5.04 MIL/uL (ref 3.87–5.11)
RDW: 15.5 % (ref 11.5–15.5)
WBC: 9.9 10*3/uL (ref 4.0–10.5)
nRBC: 0 % (ref 0.0–0.2)

## 2023-03-26 LAB — HEMOGLOBIN A1C
Hgb A1c MFr Bld: 6.4 % — ABNORMAL HIGH (ref 4.8–5.6)
Mean Plasma Glucose: 136.98 mg/dL

## 2023-03-26 LAB — BASIC METABOLIC PANEL
Anion gap: 8 (ref 5–15)
BUN: 21 mg/dL — ABNORMAL HIGH (ref 6–20)
CO2: 28 mmol/L (ref 22–32)
Calcium: 9.5 mg/dL (ref 8.9–10.3)
Chloride: 103 mmol/L (ref 98–111)
Creatinine, Ser: 0.84 mg/dL (ref 0.44–1.00)
GFR, Estimated: >60 mL/min (ref >60)
Glucose, Bld: 92 mg/dL (ref 70–99)
Potassium: 4.2 mmol/L (ref 3.5–5.1)
Sodium: 139 mmol/L (ref 135–145)

## 2023-03-26 LAB — SURGICAL PCR SCREEN
MRSA, PCR: NEGATIVE
Staphylococcus aureus: NEGATIVE

## 2023-03-26 LAB — GLUCOSE, CAPILLARY: Glucose-Capillary: 92 mg/dL (ref 70–99)

## 2023-03-26 LAB — TYPE AND SCREEN
ABO/RH(D): A POS
Antibody Screen: NEGATIVE

## 2023-03-26 NOTE — Progress Notes (Signed)
PCP - Dr. Neena Rhymes Cardiologist -  Denies  PPM/ICD - denies Device Orders - n/a Rep Notified - n/a  Chest x-ray - denies EKG - 03-26-23 Stress Test - denies ECHO - denies Cardiac Cath - denies  Sleep Study - denies  Fasting Blood Sugar -  Blood sugar at PAT appointment 92.  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 Patient agrees to go over the instructions while at home for a better understanding. Patient also instructed to self quarantine after being tested for COVID-19. The opportunity to ask questions was provided.

## 2023-03-30 NOTE — Anesthesia Preprocedure Evaluation (Addendum)
Anesthesia Evaluation  Patient identified by MRN, date of birth, ID band Patient awake    Reviewed: Allergy & Precautions, H&P , NPO status , Patient's Chart, lab work & pertinent test results  History of Anesthesia Complications (+) PONV and history of anesthetic complications  Airway Mallampati: II  TM Distance: >3 FB Neck ROM: Full    Dental no notable dental hx.    Pulmonary neg pulmonary ROS   Pulmonary exam normal breath sounds clear to auscultation       Cardiovascular hypertension, Normal cardiovascular exam Rhythm:Regular Rate:Normal     Neuro/Psych  Headaches  negative psych ROS   GI/Hepatic Neg liver ROS,GERD  ,,  Endo/Other  negative endocrine ROSdiabetes    Renal/GU Renal disease  negative genitourinary   Musculoskeletal  (+) Arthritis ,    Abdominal   Peds negative pediatric ROS (+)  Hematology   Anesthesia Other Findings   Reproductive/Obstetrics negative OB ROS                              Anesthesia Physical Anesthesia Plan  ASA: 3  Anesthesia Plan: General   Post-op Pain Management:    Induction: Intravenous  PONV Risk Score and Plan: TIVA, Ondansetron and Dexamethasone  Airway Management Planned: Oral ETT  Additional Equipment:   Intra-op Plan:   Post-operative Plan: Extubation in OR  Informed Consent: I have reviewed the patients History and Physical, chart, labs and discussed the procedure including the risks, benefits and alternatives for the proposed anesthesia with the patient or authorized representative who has indicated his/her understanding and acceptance.     Dental advisory given  Plan Discussed with: CRNA  Anesthesia Plan Comments: (PAT note written 03/30/2023 by Shonna Chock, PA-C.  History includes never smoker, postoperative N/V, HTN, DM2, GERD, osteoarthritis (right TKA 12/06/20), nephrolithiasis. BMI is consistent with obesity.      BP 129/84. EKG showed NSR. DM2 appears well controlled with A1c 6.4% (uses Dexcom G6 sensor). At PAT, she was advised to hold Jardiance 72 hours prior to surgery and metformin on the morning of surgery. Anesthesia team to evaluate on the day of surgery. )        Anesthesia Quick Evaluation

## 2023-03-30 NOTE — Progress Notes (Signed)
Anesthesia Chart Review:  Case: [id number] Date/Time: 04/08/23 0715   Procedure: PLIF,IP,POSTERIOR INSTRUMENTATION L34 - 3C   Anesthesia type: General   Pre-op diagnosis: SPONDYLOLISTHESIS, LUMBAR REGION   [facility]: [facility]OM [facility] / [facility]   Surgeons: Tressie Stalker, MD       DISCUSSION: Patient is a 57 year old female scheduled for the above procedure.  History includes never smoker, postoperative N/V, HTN, DM2, GERD, osteoarthritis (right TKA 12/06/20), nephrolithiasis. BMI is consistent with obesity.    BP 129/84. EKG showed NSR. DM2 appears well controlled with A1c 6.4% (uses Dexcom G6 sensor). At PAT, she was advised to hold Jardiance 72 hours prior to surgery and metformin on the morning of surgery. Anesthesia team to evaluate on the day of surgery.    VS: BP 129/84   Pulse 79   Temp 36.7 C   Resp 17   Ht 5\' 3"  (1.6 m)   Wt 93.8 kg   SpO2 98%   BMI 36.63 kg/m   PROVIDERS: Sheliah Hatch, MD is PCP   LABS: Labs reviewed: Acceptable for surgery. (all labs ordered are listed, but only abnormal results are displayed)  Labs Reviewed  HEMOGLOBIN A1C - Abnormal; Notable for the following components:      Result Value   Hgb A1c MFr Bld 6.4 (*)    All other components within normal limits  BASIC METABOLIC PANEL - Abnormal; Notable for the following components:   BUN 21 (*)    All other components within normal limits  SURGICAL PCR SCREEN  GLUCOSE, CAPILLARY  CBC  TYPE AND SCREEN    IMAGES: MRI L-spine 10/20/22 images can be viewed in [facility]/PACS.  EKG: 03/26/23: Normal sinus rhythm Normal ECG When compared with ECG of 07-Jun-2020 10:48, No significant change since last tracing Confirmed by [place], Peter [phone]) on 03/26/2023 12:30:15 PM  CV: N/A  Past Medical History:  Diagnosis Date   DM type 2 (diabetes mellitus, type 2) (HCC)    followed by pcp   GERD (gastroesophageal reflux disease)    Headache    History of kidney stones    Hypertension    OA  (osteoarthritis)    PONV (postoperative nausea and vomiting)    Right knee meniscal tear     Past Surgical History:  Procedure Laterality Date   BREAST REDUCTION SURGERY  2006   CARPECTOMY Left 2007   proximal row, wrist   CYSTOSCOPY/URETEROSCOPY/HOLMIUM LASER/STENT PLACEMENT Left 06/07/2020   Procedure: CYSTOSCOPY/RETROGRADE/URETEROSCOPY/HOLMIUM LASER/STENT PLACEMENT;  Surgeon: Rene Paci, MD;  Location: [facility];  Service: Urology;  Laterality: Left;  ONLY NEEDS 45 MIN   GANGLION CYST EXCISION Right 1999   wrist   KIDNEY STONE SURGERY  06/07/2020   KNEE ARTHROSCOPY Right 2000   KNEE ARTHROSCOPY Right 08/16/2020   Procedure: RIGHT KNEE ARTHROSCOPY WITH PARTIAL MEDIAL MENISCECTOMY;  Surgeon: Kathryne Hitch, MD;  Location: [facility];  Service: Orthopedics;  Laterality: Right;   TONSILLECTOMY  age 35   TOTAL KNEE ARTHROPLASTY Right 12/06/2020   Procedure: RIGHT TOTAL KNEE ARTHROPLASTY;  Surgeon: Kathryne Hitch, MD;  Location: [facility];  Service: Orthopedics;  Laterality: Right;    MEDICATIONS:  atorvastatin (LIPITOR) 10 MG tablet   Continuous Blood Gluc Receiver (DEXCOM G6 RECEIVER) DEVI   Continuous Glucose Sensor (DEXCOM G6 SENSOR) MISC   Continuous Glucose Transmitter (DEXCOM G6 TRANSMITTER) MISC   empagliflozin (JARDIANCE) 10 MG TABS tablet   ibuprofen (ADVIL) 200 MG tablet   lansoprazole (PREVACID) 30 MG  capsule   levonorgestrel (MIRENA) 20 MCG/24HR IUD   lisinopril (ZESTRIL) 10 MG tablet   meloxicam (MOBIC) 15 MG tablet   metFORMIN (GLUCOPHAGE) 500 MG tablet   Multiple Vitamin (MULTIVITAMIN WITH MINERALS) TABS tablet   pregabalin (LYRICA) 75 MG capsule    0.9 %  sodium chloride infusion    Shonna Chock, PA-C Surgical Short Stay/Anesthesiology [facility] Phone [phone] [facility] Phone [phone] 03/30/2023 5:31 PM

## 2023-04-04 ENCOUNTER — Other Ambulatory Visit: Payer: Self-pay | Admitting: Family Medicine

## 2023-04-07 NOTE — Progress Notes (Signed)
Patient was called to be informed that the surgery time for tomorrwo was changed to 11:11 o'clock. Patient was instructed to be at the hospital at 09:10 o'clock. Patient verbalized understanding.

## 2023-04-08 ENCOUNTER — Other Ambulatory Visit: Payer: Self-pay

## 2023-04-08 ENCOUNTER — Encounter (HOSPITAL_COMMUNITY)
Admission: RE | Disposition: A | Discharge status: Home / Self Care | Payer: Self-pay | Source: Home / Self Care | Attending: Neurosurgery

## 2023-04-08 ENCOUNTER — Ambulatory Visit (HOSPITAL_COMMUNITY): Payer: 59 | Admitting: Vascular Surgery

## 2023-04-08 ENCOUNTER — Ambulatory Visit (HOSPITAL_COMMUNITY): Payer: 59

## 2023-04-08 ENCOUNTER — Ambulatory Visit (HOSPITAL_COMMUNITY)
Admission: RE | Admit: 2023-04-08 | Discharge: 2023-04-09 | Disposition: A | Discharge status: Home / Self Care | Payer: 59 | Attending: Neurosurgery | Admitting: Neurosurgery

## 2023-04-08 ENCOUNTER — Encounter (HOSPITAL_COMMUNITY): Payer: Self-pay | Admitting: Neurosurgery

## 2023-04-08 ENCOUNTER — Ambulatory Visit (HOSPITAL_BASED_OUTPATIENT_CLINIC_OR_DEPARTMENT_OTHER): Payer: Self-pay | Admitting: Anesthesiology

## 2023-04-08 DIAGNOSIS — I10 Essential (primary) hypertension: Secondary | ICD-10-CM

## 2023-04-08 DIAGNOSIS — Z981 Arthrodesis status: Secondary | ICD-10-CM | POA: Diagnosis not present

## 2023-04-08 DIAGNOSIS — M4306 Spondylolysis, lumbar region: Secondary | ICD-10-CM | POA: Diagnosis not present

## 2023-04-08 DIAGNOSIS — Z79899 Other long term (current) drug therapy: Secondary | ICD-10-CM | POA: Insufficient documentation

## 2023-04-08 DIAGNOSIS — M5136 Other intervertebral disc degeneration, lumbar region: Secondary | ICD-10-CM | POA: Diagnosis not present

## 2023-04-08 DIAGNOSIS — M1990 Unspecified osteoarthritis, unspecified site: Secondary | ICD-10-CM | POA: Insufficient documentation

## 2023-04-08 DIAGNOSIS — M48062 Spinal stenosis, lumbar region with neurogenic claudication: Secondary | ICD-10-CM

## 2023-04-08 DIAGNOSIS — M4316 Spondylolisthesis, lumbar region: Secondary | ICD-10-CM

## 2023-04-08 DIAGNOSIS — E119 Type 2 diabetes mellitus without complications: Secondary | ICD-10-CM | POA: Diagnosis not present

## 2023-04-08 DIAGNOSIS — K219 Gastro-esophageal reflux disease without esophagitis: Secondary | ICD-10-CM | POA: Insufficient documentation

## 2023-04-08 DIAGNOSIS — M5116 Intervertebral disc disorders with radiculopathy, lumbar region: Secondary | ICD-10-CM | POA: Diagnosis not present

## 2023-04-08 DIAGNOSIS — Z791 Long term (current) use of non-steroidal anti-inflammatories (NSAID): Secondary | ICD-10-CM | POA: Diagnosis not present

## 2023-04-08 DIAGNOSIS — Z7984 Long term (current) use of oral hypoglycemic drugs: Secondary | ICD-10-CM | POA: Diagnosis not present

## 2023-04-08 DIAGNOSIS — R519 Headache, unspecified: Secondary | ICD-10-CM | POA: Diagnosis not present

## 2023-04-08 LAB — POCT PREGNANCY, URINE: Preg Test, Ur: NEGATIVE

## 2023-04-08 LAB — GLUCOSE, CAPILLARY
Glucose-Capillary: 101 mg/dL — ABNORMAL HIGH (ref 70–99)
Glucose-Capillary: 132 mg/dL — ABNORMAL HIGH (ref 70–99)
Glucose-Capillary: 137 mg/dL — ABNORMAL HIGH (ref 70–99)

## 2023-04-08 SURGERY — POSTERIOR LUMBAR FUSION 1 LEVEL
Anesthesia: General

## 2023-04-08 MED ORDER — PROPOFOL 10 MG/ML IV BOLUS
INTRAVENOUS | Status: DC | PRN
  Administered 2023-04-08: 200 mg via INTRAVENOUS

## 2023-04-08 MED ORDER — PHENYLEPHRINE 80 MCG/ML (10ML) SYRINGE FOR IV PUSH (FOR BLOOD PRESSURE SUPPORT)
PREFILLED_SYRINGE | INTRAVENOUS | Status: DC | PRN
  Administered 2023-04-08: 80 ug via INTRAVENOUS

## 2023-04-08 MED ORDER — VANCOMYCIN HCL IN DEXTROSE 1-5 GM/200ML-% IV SOLN
1000.0000 mg | INTRAVENOUS | Status: AC
  Administered 2023-04-08: 1000 mg via INTRAVENOUS
  Filled 2023-04-08: qty 200

## 2023-04-08 MED ORDER — FENTANYL CITRATE (PF) 100 MCG/2ML IJ SOLN
INTRAMUSCULAR | Status: DC | PRN
  Administered 2023-04-08 (×2): 100 ug via INTRAVENOUS
  Administered 2023-04-08: 50 ug via INTRAVENOUS

## 2023-04-08 MED ORDER — DEXMEDETOMIDINE HCL IN NACL 80 MCG/20ML IV SOLN
INTRAVENOUS | Status: DC | PRN
Start: 2023-04-08 — End: 2023-04-08
  Administered 2023-04-08 (×2): 4 ug via INTRAVENOUS

## 2023-04-08 MED ORDER — DIPHENHYDRAMINE HCL 50 MG/ML IJ SOLN
INTRAMUSCULAR | Status: AC
  Administered 2023-04-08: 25 mg via INTRAVENOUS
  Filled 2023-04-08: qty 1

## 2023-04-08 MED ORDER — METFORMIN HCL 500 MG PO TABS
500.0000 mg | ORAL_TABLET | Freq: Two times a day (BID) | ORAL | Status: DC
  Administered 2023-04-08 – 2023-04-09 (×2): 500 mg via ORAL
  Filled 2023-04-08 (×2): qty 1

## 2023-04-08 MED ORDER — DIPHENHYDRAMINE HCL 50 MG/ML IJ SOLN: 25.0000 mg | INTRAMUSCULAR | Status: AC

## 2023-04-08 MED ORDER — DIPHENHYDRAMINE HCL 25 MG PO CAPS
25.0000 mg | ORAL_CAPSULE | Freq: Four times a day (QID) | ORAL | Status: DC | PRN
  Filled 2023-04-08: qty 1

## 2023-04-08 MED ORDER — SURGIRINSE WOUND IRRIGATION SYSTEM - OPTIME
TOPICAL | Status: DC | PRN
  Administered 2023-04-08: 450 mL

## 2023-04-08 MED ORDER — DIPHENHYDRAMINE HCL 25 MG PO CAPS
25.0000 mg | ORAL_CAPSULE | Freq: Four times a day (QID) | ORAL | Status: DC | PRN
  Administered 2023-04-08 – 2023-04-09 (×2): 50 mg via ORAL
  Administered 2023-04-09: 25 mg via ORAL
  Filled 2023-04-08: qty 1
  Filled 2023-04-08 (×2): qty 2

## 2023-04-08 MED ORDER — ONDANSETRON HCL 4 MG/2ML IJ SOLN
4.0000 mg | Freq: Four times a day (QID) | INTRAMUSCULAR | Status: DC | PRN
  Administered 2023-04-08: 4 mg via INTRAVENOUS
  Filled 2023-04-08: qty 2

## 2023-04-08 MED ORDER — ONDANSETRON HCL 4 MG PO TABS: 4.0000 mg | ORAL_TABLET | Freq: Four times a day (QID) | ORAL | Status: DC | PRN

## 2023-04-08 MED ORDER — ACETAMINOPHEN 325 MG PO TABS: 650.0000 mg | ORAL_TABLET | ORAL | Status: DC | PRN

## 2023-04-08 MED ORDER — ROCURONIUM BROMIDE 10 MG/ML (PF) SYRINGE
PREFILLED_SYRINGE | INTRAVENOUS | Status: DC | PRN
  Administered 2023-04-08: 30 mg via INTRAVENOUS
  Administered 2023-04-08: 50 mg via INTRAVENOUS
  Administered 2023-04-08 (×2): 10 mg via INTRAVENOUS

## 2023-04-08 MED ORDER — MIDAZOLAM HCL 5 MG/5ML IJ SOLN
INTRAMUSCULAR | Status: DC | PRN
  Administered 2023-04-08: 2 mg via INTRAVENOUS

## 2023-04-08 MED ORDER — SODIUM CHLORIDE 0.9 % IV SOLN
250.0000 mL | INTRAVENOUS | Status: DC
  Administered 2023-04-08: 250 mL via INTRAVENOUS

## 2023-04-08 MED ORDER — 0.9 % SODIUM CHLORIDE (POUR BTL) OPTIME
TOPICAL | Status: DC | PRN
  Administered 2023-04-08: 1000 mL

## 2023-04-08 MED ORDER — THROMBIN 5000 UNITS EX SOLR
CUTANEOUS | Status: AC
  Filled 2023-04-08: qty 5000

## 2023-04-08 MED ORDER — BISACODYL 10 MG RE SUPP: 10.0000 mg | Freq: Every day | RECTAL | Status: DC | PRN

## 2023-04-08 MED ORDER — KETAMINE HCL 10 MG/ML IJ SOLN
INTRAMUSCULAR | Status: DC | PRN
Start: 2023-04-08 — End: 2023-04-08
  Administered 2023-04-08: 40 mg via INTRAVENOUS
  Administered 2023-04-08: 10 mg via INTRAVENOUS

## 2023-04-08 MED ORDER — ACETAMINOPHEN 650 MG RE SUPP: 650.0000 mg | RECTAL | Status: DC | PRN

## 2023-04-08 MED ORDER — THROMBIN 5000 UNITS EX SOLR
OROMUCOSAL | Status: DC | PRN
  Administered 2023-04-08 (×2): 5 mL via TOPICAL

## 2023-04-08 MED ORDER — FENTANYL CITRATE (PF) 100 MCG/2ML IJ SOLN
25.0000 ug | INTRAMUSCULAR | Status: DC | PRN
  Administered 2023-04-08: 50 ug via INTRAVENOUS

## 2023-04-08 MED ORDER — CHLORHEXIDINE GLUCONATE CLOTH 2 % EX PADS: 6.0000 | MEDICATED_PAD | Freq: Once | CUTANEOUS | Status: DC

## 2023-04-08 MED ORDER — SCOPOLAMINE 1 MG/3DAYS TD PT72
1.0000 | MEDICATED_PATCH | TRANSDERMAL | Status: DC
  Administered 2023-04-08: 1.5 mg via TRANSDERMAL
  Filled 2023-04-08: qty 1

## 2023-04-08 MED ORDER — DOCUSATE SODIUM 100 MG PO CAPS
100.0000 mg | ORAL_CAPSULE | Freq: Two times a day (BID) | ORAL | Status: DC
  Administered 2023-04-08: 100 mg via ORAL
  Filled 2023-04-08: qty 1

## 2023-04-08 MED ORDER — MORPHINE SULFATE (PF) 4 MG/ML IV SOLN: 4.0000 mg | INTRAVENOUS | Status: DC | PRN

## 2023-04-08 MED ORDER — DROPERIDOL 2.5 MG/ML IJ SOLN: 0.6250 mg | Freq: Once | INTRAMUSCULAR | Status: DC | PRN

## 2023-04-08 MED ORDER — PHENOL 1.4 % MT LIQD: 1.0000 | OROMUCOSAL | Status: DC | PRN

## 2023-04-08 MED ORDER — ZOLPIDEM TARTRATE 5 MG PO TABS: 5.0000 mg | ORAL_TABLET | Freq: Every evening | ORAL | Status: DC | PRN

## 2023-04-08 MED ORDER — SODIUM CHLORIDE 0.9% FLUSH
3.0000 mL | Freq: Two times a day (BID) | INTRAVENOUS | Status: DC
  Administered 2023-04-08: 3 mL via INTRAVENOUS

## 2023-04-08 MED ORDER — INSULIN ASPART 100 UNIT/ML IJ SOLN: 0.0000 [IU] | INTRAMUSCULAR | Status: DC

## 2023-04-08 MED ORDER — BACITRACIN ZINC 500 UNIT/GM EX OINT
TOPICAL_OINTMENT | CUTANEOUS | Status: AC
  Filled 2023-04-08: qty 28.35

## 2023-04-08 MED ORDER — INSULIN ASPART 100 UNIT/ML IJ SOLN: 0.0000 [IU] | INTRAMUSCULAR | Status: DC | PRN

## 2023-04-08 MED ORDER — BUPIVACAINE-EPINEPHRINE (PF) 0.5% -1:200000 IJ SOLN
INTRAMUSCULAR | Status: AC
  Filled 2023-04-08: qty 30

## 2023-04-08 MED ORDER — SUGAMMADEX SODIUM 200 MG/2ML IV SOLN
INTRAVENOUS | Status: DC | PRN
Start: 2023-04-08 — End: 2023-04-08
  Administered 2023-04-08: 400 mg via INTRAVENOUS

## 2023-04-08 MED ORDER — TRAMADOL HCL 50 MG PO TABS: 50.0000 mg | ORAL_TABLET | Freq: Four times a day (QID) | ORAL | Status: DC | PRN

## 2023-04-08 MED ORDER — MENTHOL 3 MG MT LOZG: 1.0000 | LOZENGE | OROMUCOSAL | Status: DC | PRN

## 2023-04-08 MED ORDER — FENTANYL CITRATE (PF) 250 MCG/5ML IJ SOLN
INTRAMUSCULAR | Status: AC
  Filled 2023-04-08: qty 5

## 2023-04-08 MED ORDER — BACITRACIN ZINC 500 UNIT/GM EX OINT
TOPICAL_OINTMENT | CUTANEOUS | Status: DC | PRN
  Administered 2023-04-08: 1 via TOPICAL

## 2023-04-08 MED ORDER — ACETAMINOPHEN 500 MG PO TABS
1000.0000 mg | ORAL_TABLET | Freq: Four times a day (QID) | ORAL | Status: DC
  Administered 2023-04-08: 500 mg via ORAL
  Administered 2023-04-08: 1000 mg via ORAL
  Administered 2023-04-09: 500 mg via ORAL
  Filled 2023-04-08 (×3): qty 2

## 2023-04-08 MED ORDER — INSULIN ASPART 100 UNIT/ML IJ SOLN: 0.0000 [IU] | Freq: Three times a day (TID) | INTRAMUSCULAR | Status: DC

## 2023-04-08 MED ORDER — HYDROCODONE-ACETAMINOPHEN 10-325 MG PO TABS
1.0000 | ORAL_TABLET | ORAL | Status: DC | PRN
  Administered 2023-04-08 – 2023-04-09 (×5): 1 via ORAL
  Filled 2023-04-08 (×5): qty 1

## 2023-04-08 MED ORDER — LIDOCAINE 2% (20 MG/ML) 5 ML SYRINGE
INTRAMUSCULAR | Status: DC | PRN
  Administered 2023-04-08: 60 mg via INTRAVENOUS

## 2023-04-08 MED ORDER — BUPIVACAINE-EPINEPHRINE (PF) 0.5% -1:200000 IJ SOLN
INTRAMUSCULAR | Status: DC | PRN
  Administered 2023-04-08: 10 mL via PERINEURAL

## 2023-04-08 MED ORDER — SODIUM CHLORIDE 0.9% FLUSH: 3.0000 mL | INTRAVENOUS | Status: DC | PRN

## 2023-04-08 MED ORDER — PROPOFOL 10 MG/ML IV BOLUS
INTRAVENOUS | Status: AC
  Filled 2023-04-08: qty 20

## 2023-04-08 MED ORDER — CYCLOBENZAPRINE HCL 10 MG PO TABS
10.0000 mg | ORAL_TABLET | Freq: Three times a day (TID) | ORAL | Status: DC | PRN
  Administered 2023-04-08 – 2023-04-09 (×3): 10 mg via ORAL
  Filled 2023-04-08 (×3): qty 1

## 2023-04-08 MED ORDER — BUPIVACAINE LIPOSOME 1.3 % IJ SUSP
INTRAMUSCULAR | Status: DC | PRN
  Administered 2023-04-08: 20 mL

## 2023-04-08 MED ORDER — DEXAMETHASONE SODIUM PHOSPHATE 10 MG/ML IJ SOLN
INTRAMUSCULAR | Status: AC
  Filled 2023-04-08: qty 1

## 2023-04-08 MED ORDER — VANCOMYCIN HCL IN DEXTROSE 1-5 GM/200ML-% IV SOLN
1000.0000 mg | Freq: Once | INTRAVENOUS | Status: AC
  Administered 2023-04-08: 1000 mg via INTRAVENOUS
  Filled 2023-04-08: qty 200

## 2023-04-08 MED ORDER — KETAMINE HCL 50 MG/5ML IJ SOSY
PREFILLED_SYRINGE | INTRAMUSCULAR | Status: AC
  Filled 2023-04-08: qty 5

## 2023-04-08 MED ORDER — LIDOCAINE 2% (20 MG/ML) 5 ML SYRINGE
INTRAMUSCULAR | Status: AC
  Filled 2023-04-08: qty 5

## 2023-04-08 MED ORDER — CHLORHEXIDINE GLUCONATE 0.12 % MT SOLN
15.0000 mL | Freq: Once | OROMUCOSAL | Status: AC
  Administered 2023-04-08: 15 mL via OROMUCOSAL
  Filled 2023-04-08: qty 15

## 2023-04-08 MED ORDER — ORAL CARE MOUTH RINSE: 15.0000 mL | Freq: Once | OROMUCOSAL | Status: AC

## 2023-04-08 MED ORDER — BUPIVACAINE LIPOSOME 1.3 % IJ SUSP
INTRAMUSCULAR | Status: AC
  Filled 2023-04-08: qty 20

## 2023-04-08 MED ORDER — MIDAZOLAM HCL 2 MG/2ML IJ SOLN
INTRAMUSCULAR | Status: AC
  Filled 2023-04-08: qty 2

## 2023-04-08 MED ORDER — ONDANSETRON HCL 4 MG/2ML IJ SOLN
INTRAMUSCULAR | Status: AC
  Filled 2023-04-08: qty 2

## 2023-04-08 MED ORDER — ONDANSETRON HCL 4 MG/2ML IJ SOLN
INTRAMUSCULAR | Status: DC | PRN
  Administered 2023-04-08: 4 mg via INTRAVENOUS

## 2023-04-08 MED ORDER — ALBUMIN HUMAN 5 % IV SOLN
INTRAVENOUS | Status: DC | PRN
Start: 2023-04-08 — End: 2023-04-08

## 2023-04-08 MED ORDER — FENTANYL CITRATE (PF) 100 MCG/2ML IJ SOLN
INTRAMUSCULAR | Status: AC
  Filled 2023-04-08: qty 2

## 2023-04-08 MED ORDER — ROCURONIUM BROMIDE 10 MG/ML (PF) SYRINGE
PREFILLED_SYRINGE | INTRAVENOUS | Status: AC
  Filled 2023-04-08: qty 10

## 2023-04-08 MED ORDER — DEXAMETHASONE SODIUM PHOSPHATE 4 MG/ML IJ SOLN
INTRAMUSCULAR | Status: DC | PRN
  Administered 2023-04-08: 10 mg via INTRAVENOUS

## 2023-04-08 MED ORDER — LACTATED RINGERS IV SOLN: INTRAVENOUS | Status: DC

## 2023-04-08 SURGICAL SUPPLY — 68 items
APL SKNCLS STERI-STRIP NONHPOA (GAUZE/BANDAGES/DRESSINGS) ×1
Altera Impaction Cap PENDING APPROVAL ×1
BAG COUNTER SPONGE SURGICOUNT (BAG) ×1 IMPLANT
BAG SPNG CNTER NS LX DISP (BAG)
BASKET BONE COLLECTION (BASKET) ×1 IMPLANT
BENZOIN TINCTURE PRP APPL 2/3 (GAUZE/BANDAGES/DRESSINGS) ×1 IMPLANT
BLADE CLIPPER SURG (BLADE) IMPLANT
BUR MATCHSTICK NEURO 3.0 LAGG (BURR) ×1 IMPLANT
BUR PRECISION FLUTE 6.0 (BURR) ×1 IMPLANT
CAGE ALTERA 10X31X9-13 15D (Cage) IMPLANT
CANISTER SUCT 3000ML PPV (MISCELLANEOUS) ×1 IMPLANT
CAP ALTERA IMPACT GL DISP (ORTHOPEDIC DISPOSABLE SUPPLIES) IMPLANT
CAP LOCK DLX THRD (Cap) IMPLANT
CNTNR URN SCR LID CUP LEK RST (MISCELLANEOUS) ×1 IMPLANT
CONT SPEC 4OZ STRL OR WHT (MISCELLANEOUS) ×1
COVER BACK TABLE 60X90IN (DRAPES) ×1 IMPLANT
DRAPE C-ARM 42X72 X-RAY (DRAPES) ×2 IMPLANT
DRAPE HALF SHEET 40X57 (DRAPES) ×1 IMPLANT
DRAPE LAPAROTOMY 100X72X124 (DRAPES) ×1 IMPLANT
DRAPE SURG 17X23 STRL (DRAPES) ×4 IMPLANT
DRSG OPSITE POSTOP 4X6 (GAUZE/BANDAGES/DRESSINGS) ×1 IMPLANT
ELECT BLADE 4.0 EZ CLEAN MEGAD (MISCELLANEOUS) ×1
ELECT REM PT RETURN 9FT ADLT (ELECTROSURGICAL) ×1
ELECTRODE BLDE 4.0 EZ CLN MEGD (MISCELLANEOUS) ×1 IMPLANT
ELECTRODE REM PT RTRN 9FT ADLT (ELECTROSURGICAL) ×1 IMPLANT
EVACUATOR 1/8 PVC DRAIN (DRAIN) IMPLANT
GAUZE 4X4 16PLY ~~LOC~~+RFID DBL (SPONGE) ×1 IMPLANT
GLOVE BIO SURGEON STRL SZ 6 (GLOVE) ×1 IMPLANT
GLOVE BIO SURGEON STRL SZ8 (GLOVE) ×2 IMPLANT
GLOVE BIO SURGEON STRL SZ8.5 (GLOVE) ×2 IMPLANT
GLOVE BIOGEL PI IND STRL 6.5 (GLOVE) ×1 IMPLANT
GLOVE EXAM NITRILE XL STR (GLOVE) IMPLANT
GOWN STRL REUS W/ TWL LRG LVL3 (GOWN DISPOSABLE) ×1 IMPLANT
GOWN STRL REUS W/ TWL XL LVL3 (GOWN DISPOSABLE) ×2 IMPLANT
GOWN STRL REUS W/TWL 2XL LVL3 (GOWN DISPOSABLE) IMPLANT
GOWN STRL REUS W/TWL LRG LVL3 (GOWN DISPOSABLE) ×1
GOWN STRL REUS W/TWL XL LVL3 (GOWN DISPOSABLE) ×2
HEMOSTAT POWDER KIT SURGIFOAM (HEMOSTASIS) ×1 IMPLANT
KIT BASIN OR (CUSTOM PROCEDURE TRAY) ×1 IMPLANT
KIT GRAFTMAG DEL NEURO DISP (NEUROSURGERY SUPPLIES) IMPLANT
KIT POSITION SURG JACKSON T1 (MISCELLANEOUS) ×1 IMPLANT
KIT TURNOVER KIT B (KITS) ×1 IMPLANT
NDL HYPO 21X1.5 SAFETY (NEEDLE) IMPLANT
NDL HYPO 22X1.5 SAFETY MO (MISCELLANEOUS) ×1 IMPLANT
NEEDLE HYPO 21X1.5 SAFETY (NEEDLE) ×1 IMPLANT
NEEDLE HYPO 22X1.5 SAFETY MO (MISCELLANEOUS) ×1 IMPLANT
NS IRRIG 1000ML POUR BTL (IV SOLUTION) ×1 IMPLANT
PACK LAMINECTOMY NEURO (CUSTOM PROCEDURE TRAY) ×1 IMPLANT
PAD ARMBOARD 7.5X6 YLW CONV (MISCELLANEOUS) ×3 IMPLANT
PATTIES SURGICAL .5 X1 (DISPOSABLE) IMPLANT
PUTTY DBM 10CC CALC GRAN (Putty) IMPLANT
ROD CREO DLX CVD 6.35X40 (Rod) IMPLANT
SCREW PA DLX CREO 7.5X50 (Screw) IMPLANT
SOLUTION IRRIG SURGIPHOR (IV SOLUTION) ×1 IMPLANT
SPIKE FLUID TRANSFER (MISCELLANEOUS) ×1 IMPLANT
SPONGE NEURO XRAY DETECT 1X3 (DISPOSABLE) IMPLANT
SPONGE SURGIFOAM ABS GEL 100 (HEMOSTASIS) IMPLANT
SPONGE T-LAP 4X18 ~~LOC~~+RFID (SPONGE) IMPLANT
STRIP CLOSURE SKIN 1/2X4 (GAUZE/BANDAGES/DRESSINGS) ×1 IMPLANT
SUT VIC AB 1 CT1 18XBRD ANBCTR (SUTURE) ×2 IMPLANT
SUT VIC AB 1 CT1 8-18 (SUTURE) ×1
SUT VIC AB 2-0 CP2 18 (SUTURE) ×2 IMPLANT
SYR 20ML LL LF (SYRINGE) IMPLANT
TAP SURG GLBU 6.5X40 (ORTHOPEDIC DISPOSABLE SUPPLIES) IMPLANT
TOWEL GREEN STERILE (TOWEL DISPOSABLE) ×1 IMPLANT
TOWEL GREEN STERILE FF (TOWEL DISPOSABLE) ×1 IMPLANT
TRAY FOLEY MTR SLVR 16FR STAT (SET/KITS/TRAYS/PACK) ×1 IMPLANT
WATER STERILE IRR 1000ML POUR (IV SOLUTION) ×1 IMPLANT

## 2023-04-08 NOTE — Anesthesia Postprocedure Evaluation (Signed)
Anesthesia Post Note  Patient: Jamie Hall  Procedure(s) Performed: Posterior Lumbar Interbody Fusion,Interbody Prothesis,Posterior Instrumentation Lumbar three-four     Patient location during evaluation: PACU Anesthesia Type: General Level of consciousness: awake and alert Pain management: pain level controlled Vital Signs Assessment: post-procedure vital signs reviewed and stable Respiratory status: spontaneous breathing, nonlabored ventilation, respiratory function stable and patient connected to nasal cannula oxygen Cardiovascular status: blood pressure returned to baseline and stable Postop Assessment: no apparent nausea or vomiting Anesthetic complications: no   No notable events documented.  Last Vitals:  Vitals:   04/08/23 1700 04/08/23 1715  BP: 127/66 125/68  Pulse: 61 80  Resp: 12 18  Temp:    SpO2: 97% 96%    Last Pain:  Vitals:   04/08/23 1715  TempSrc:   PainSc: Asleep    LLE Motor Response: Responds to commands (04/08/23 1715) LLE Sensation: Full sensation (04/08/23 1715) RLE Motor Response: Responds to commands (04/08/23 1715) RLE Sensation: Full sensation (04/08/23 1715)      [place]

## 2023-04-08 NOTE — Anesthesia Procedure Notes (Signed)
Procedure Name: Intubation Date/Time: 04/08/2023 12:47 PM  Performed by: Montez Morita, Chandani Rogowski W, CRNAPre-anesthesia Checklist: Patient identified, Emergency Drugs available, Suction available and Patient being monitored Patient Re-evaluated:Patient Re-evaluated prior to induction Oxygen Delivery Method: Circle system utilized Preoxygenation: Pre-oxygenation with 100% oxygen Induction Type: IV induction Ventilation: Mask ventilation without difficulty Laryngoscope Size: Miller, 2 and Glidescope Grade View: Grade II Tube type: Oral Tube size: 7.0 mm Number of attempts: 2 Airway Equipment and Method: Stylet, Video-laryngoscopy and Rigid stylet Placement Confirmation: ETT inserted through vocal cords under direct vision, positive ETCO2 and breath sounds checked- equal and bilateral Secured at: 22 cm Tube secured with: Tape Dental Injury: Teeth and Oropharynx as per pre-operative assessment  Difficulty Due To: Difficult Airway- due to limited oral opening and Difficult Airway- due to anterior larynx

## 2023-04-08 NOTE — Transfer of Care (Signed)
Immediate Anesthesia Transfer of Care Note  Patient: Jamie Hall  Procedure(s) Performed: Posterior Lumbar Interbody Fusion,Interbody Prothesis,Posterior Instrumentation Lumbar three-four  Patient Location: PACU  Anesthesia Type:General  Level of Consciousness: awake, oriented, and patient cooperative  Airway & Oxygen Therapy: Patient Spontanous Breathing and Patient connected to nasal cannula oxygen  Post-op Assessment: Report given to RN and Post -op Vital signs reviewed and stable  Post vital signs: Reviewed and stable  Last Vitals:  Vitals Value Taken Time  BP 113/55 04/08/23 1618  Temp    Pulse 84 04/08/23 1621  Resp 14 04/08/23 1621  SpO2 97 % 04/08/23 1621  Vitals shown include unfiled device data.  Last Pain:  Vitals:   04/08/23 0923  TempSrc:   PainSc: 0-No pain      Patients Stated Pain Goal: 0 (04/08/23 0923)  Complications: No notable events documented.

## 2023-04-08 NOTE — Op Note (Signed)
Brief history: The patient is a 57 year old white female who is complaining of back and left greater right leg pain consistent with neurogenic claudication/lumbar radiculopathy.  She has failed medical management.  She was worked up with lumbar x-rays and lumbar MRI which demonstrated an L3-4 spondylolisthesis.  I discussed the various treatment options with her.  She has decided to proceed with surgery.  Preoperative diagnosis: L3-4 spondylolisthesis, facet arthropathy, degenerative disc disease, spinal stenosis compressing both the L3 and the L4 nerve roots; lumbago; lumbar radiculopathy; neurogenic claudication  Postoperative diagnosis: The same  Procedure: Bilateral L3-4 laminotomy/foraminotomies/medial facetectomy to decompress the bilateral L3 and L4 nerve roots(the work required to do this was in addition to the work required to do the posterior lumbar interbody fusion because of the patient's spinal stenosis, facet arthropathy. Etc. requiring a wide decompression of the nerve roots.); left L3-4 transforaminal lumbar interbody fusion with local morselized autograft bone and Zimmer DBM; insertion of interbody prosthesis at L3-4 (globus peek expandable interbody prosthesis); posterior nonsegmental instrumentation from L3 to L4 with globus titanium pedicle screws and rods; posterior lateral arthrodesis at L3-4 with local morselized autograft bone and Zimmer DBM.  Surgeon: Dr. Delma Officer  Asst.: None  Anesthesia: Gen. endotracheal  Estimated blood loss: 200 cc  Drains: None  Complications: None  Description of procedure: The patient was brought to the operating room by the anesthesia team. General endotracheal anesthesia was induced. The patient was turned to the prone position on the Wilson frame. The patient's lumbosacral region was then prepared with Betadine scrub and Betadine solution. Sterile drapes were applied.  I then injected the area to be incised with Marcaine with epinephrine  solution. I then used the scalpel to make a linear midline incision over the L3-4 interspace. I then used electrocautery to perform a bilateral subperiosteal dissection exposing the spinous process and lamina of L3-4. We then obtained intraoperative radiograph to confirm our location. We then inserted the Verstrac retractor to provide exposure.  I began the decompression by using the high speed drill to perform laminotomies at L3-4 bilaterally. We then used the Kerrison punches to widen the laminotomy and removed the ligamentum flavum at L3-4 bilaterally. We used the Kerrison punches to remove the medial facets at L3-4 bilaterally, we removed the left L3-4 facet.. We performed wide foraminotomies about the bilateral L3 and L4 nerve roots completing the decompression.  We now turned our attention to the posterior lumbar interbody fusion. I used a scalpel to incise the intervertebral disc at L3-4 bilaterally. I then performed a partial intervertebral discectomy at L3-4 bilaterally using the pituitary forceps. We prepared the vertebral endplates at L3-4 bilaterally for the fusion by removing the soft tissues with the curettes. We then used the trial spacers to pick the appropriate sized interbody prosthesis. We prefilled his prosthesis with a combination of local morselized autograft bone that we obtained during the decompression as well as Zimmer DBM. We inserted the prefilled prosthesis into the interspace at L3-4 from the left, we then turned and expanded the prosthesis. There was a good snug fit of the prosthesis in the interspace. We then filled and the remainder of the intervertebral disc space with local morselized autograft bone and Zimmer DBM. This completed the posterior lumbar interbody arthrodesis.  During the decompression and insertion of the prosthesis the assistant protected the thecal sac and nerve roots with the D'Errico retractor.  We now turned attention to the instrumentation. Under  fluoroscopic guidance we cannulated the bilateral L3 and L4 pedicles  with the bone probe. We then removed the bone probe. We then tapped the pedicle with a 6.5 millimeter tap. We then removed the tap. We probed inside the tapped pedicle with a ball probe to rule out cortical breaches. We then inserted a 7.5 x 50 millimeter pedicle screw into the L3 and L4 pedicles bilaterally under fluoroscopic guidance. We then palpated along the medial aspect of the pedicles to rule out cortical breaches. There were none. The nerve roots were not injured. We then connected the unilateral pedicle screws with a lordotic rod. We compressed the construct and secured the rod in place with the caps. We then tightened the caps appropriately. This completed the instrumentation from L3-4.  We now turned our attention to the posterior lateral arthrodesis at L3-4. We used the high-speed drill to decorticate the remainder of the facets, pars, transverse process at L3-4. We then applied a combination of local morselized autograft bone and Zimmer DBM over these decorticated posterior lateral structures. This completed the posterior lateral arthrodesis.  We then obtained hemostasis using bipolar electrocautery. We irrigated the wound out with Betadine solution. We inspected the thecal sac and nerve roots and noted they were well decompressed. We then removed the retractor.  We injected Exparel . We reapproximated patient's thoracolumbar fascia with interrupted #1 Vicryl suture. We reapproximated patient's subcutaneous tissue with interrupted 2-0 Vicryl suture. The reapproximated patient's skin with Steri-Strips and benzoin. The wound was then coated with bacitracin ointment. A sterile dressing was applied. The drapes were removed. The patient was subsequently returned to the supine position where they were extubated by the anesthesia team. He was then transported to the post anesthesia care unit in stable condition. All sponge instrument and  needle counts were reportedly correct at the end of this case.

## 2023-04-08 NOTE — Progress Notes (Signed)
Went to place scopolamine patch as ordered and found scalp was very red and patient complained of itching. Vancomycin was running and had 10cc left to infuse. Anesthesia notified. Ordered Benadryl, given as ordered. Patient verbalized relief after Benadryl administered.

## 2023-04-08 NOTE — H&P (Signed)
Subjective: The patient is a 57 year old white female who has complained of back and left greater right leg pain consistent with neurogenic claudication/lumbar radiculopathy.  She has failed medical management and was worked up with a lumbar MRI and lumbar x-rays which demonstrated an L3-4 spondylolisthesis, dissection, stenosis, etc.  I discussed the various treatment options with her.  She has decided proceed with surgery.  Past Medical History:  Diagnosis Date   DM type 2 (diabetes mellitus, type 2) (HCC)    followed by pcp   GERD (gastroesophageal reflux disease)    Headache    History of kidney stones    Hypertension    OA (osteoarthritis)    PONV (postoperative nausea and vomiting)    Right knee meniscal tear     Past Surgical History:  Procedure Laterality Date   BREAST REDUCTION SURGERY  2006   CARPECTOMY Left 2007   proximal row, wrist   CYSTOSCOPY/URETEROSCOPY/HOLMIUM LASER/STENT PLACEMENT Left 06/07/2020   Procedure: CYSTOSCOPY/RETROGRADE/URETEROSCOPY/HOLMIUM LASER/STENT PLACEMENT;  Surgeon: Rene Paci, MD;  Location: [facility];  Service: Urology;  Laterality: Left;  ONLY NEEDS 45 MIN   GANGLION CYST EXCISION Right 1999   wrist   KIDNEY STONE SURGERY  06/07/2020   KNEE ARTHROSCOPY Right 2000   KNEE ARTHROSCOPY Right 08/16/2020   Procedure: RIGHT KNEE ARTHROSCOPY WITH PARTIAL MEDIAL MENISCECTOMY;  Surgeon: Kathryne Hitch, MD;  Location: [facility];  Service: Orthopedics;  Laterality: Right;   TONSILLECTOMY  age 16   TOTAL KNEE ARTHROPLASTY Right 12/06/2020   Procedure: RIGHT TOTAL KNEE ARTHROPLASTY;  Surgeon: Kathryne Hitch, MD;  Location: [facility];  Service: Orthopedics;  Laterality: Right;    Allergies  Allergen Reactions   Bee Venom Swelling    EDEMA   Benzoyl Peroxide Other (See Comments)    Makes eyes shut   Ceftin [Cefuroxime] Swelling    Throat swelling   Clindamycin/Lincomycin Hives   Codeine Hives    Dilaudid [Hydromorphone] Hives   Hydrocodone     itching   Oxycodone-Acetaminophen Hives   Penicillins Hives   Tape Other (See Comments)    Blisters all adhesives likes paper tape    Social History   Tobacco Use   Smoking status: Never   Smokeless tobacco: Never  Substance Use Topics   Alcohol use: Not Currently    Family History  Problem Relation Age of Onset   Early death Mother    Stroke Mother    Healthy Father    Healthy Sister    Healthy Daughter    Cancer Maternal Grandfather    Drug abuse Maternal Grandfather    Hypertension Maternal Grandfather    Colon cancer Neg Hx    Esophageal cancer Neg Hx    Liver cancer Neg Hx    Pancreatic cancer Neg Hx    Rectal cancer Neg Hx    Stomach cancer Neg Hx    Prior to Admission medications   Medication Sig Start Date End Date Taking? Authorizing Provider  atorvastatin (LIPITOR) 10 MG tablet TAKE 1 TABLET BY MOUTH EVERYDAY AT BEDTIME 04/05/23  Yes Sheliah Hatch, MD  empagliflozin (JARDIANCE) 10 MG TABS tablet Take 1 tablet (10 mg total) by mouth daily before breakfast. 10/26/22  Yes Sheliah Hatch, MD  ibuprofen (ADVIL) 200 MG tablet Take 400-600 mg by mouth daily as needed (pain.).   Yes [provider]  lansoprazole (PREVACID) 30 MG capsule Take 1 capsule (30 mg total) by mouth daily. Patient taking differently: Take  30 mg by mouth daily as needed (indigestion/heartburn). 08/07/22  Yes Sheliah Hatch, MD  lisinopril (ZESTRIL) 10 MG tablet TAKE 1 TABLET BY MOUTH EVERY DAY 04/05/23  Yes Sheliah Hatch, MD  meloxicam (MOBIC) 15 MG tablet TAKE 1 TABLET BY MOUTH EVERY DAY Patient taking differently: Take 15 mg by mouth in the morning. 01/11/23  Yes Kathryne Hitch, MD  metFORMIN (GLUCOPHAGE) 500 MG tablet TAKE 1 TABLET BY MOUTH TWO TIMES DAILY WITH A MEAL 11/16/22 11/16/23 Yes Sheliah Hatch, MD  Multiple Vitamin (MULTIVITAMIN WITH MINERALS) TABS tablet Take 1 tablet by mouth in the morning.    Yes [provider]  pregabalin (LYRICA) 75 MG capsule Take 75 mg by mouth 2 (two) times daily.   Yes [provider]  Continuous Blood Gluc Receiver (DEXCOM G6 RECEIVER) DEVI Use as directed 09/30/22   Sheliah Hatch, MD  Continuous Glucose Sensor (DEXCOM G6 SENSOR) MISC Change sensor every 10 days 12/25/22   Sheliah Hatch, MD  Continuous Glucose Transmitter (DEXCOM G6 TRANSMITTER) MISC USE AS DIRECTED 03/22/23   Sheliah Hatch, MD  levonorgestrel (MIRENA) 20 MCG/24HR IUD 1 each by Intrauterine route once.    [provider]     Review of Systems  Positive ROS: As above  All other systems have been reviewed and were otherwise negative with the exception of those mentioned in the HPI and as above.  Objective: Vital signs in last 24 hours: Temp:  [98.7 F (37.1 C)] 98.7 F (37.1 C) (09/12 0912) Pulse Rate:  [75] 75 (09/12 0912) Resp:  [18] 18 (09/12 0912) BP: (143)/(87) 143/87 (09/12 0912) SpO2:  [97 %] 97 % (09/12 0912) Weight:  [92.2 kg] 92.2 kg (09/12 0912) Estimated body mass index is 36 kg/m as calculated from the following:   Height as of this encounter: 5\' 3"  (1.6 m).   Weight as of this encounter: 92.2 kg.   General Appearance: Alert Head: Normocephalic, without obvious abnormality, atraumatic Eyes: PERRL, conjunctiva/corneas clear, EOM's intact,    Ears: Normal  Throat: Normal  Neck: Supple, Back: unremarkable Lungs: Clear to auscultation bilaterally, respirations unlabored Heart: Regular rate and rhythm, no murmur, rub or gallop Abdomen: Soft, non-tender Extremities: Extremities normal, atraumatic, no cyanosis or edema Skin: unremarkable  NEUROLOGIC:   Mental status: alert and oriented,Motor Exam - grossly normal Sensory Exam - grossly normal Reflexes:  Coordination - grossly normal Gait - grossly normal Balance - grossly normal Cranial Nerves: I: smell Not tested  II: visual acuity  OS: Normal  OD: Normal   II:  visual fields Full to confrontation  II: pupils Equal, round, reactive to light  III,VII: ptosis None  III,IV,VI: extraocular muscles  Full ROM  V: mastication Normal  V: facial light touch sensation  Normal  V,VII: corneal reflex  Present  VII: facial muscle function - upper  Normal  VII: facial muscle function - lower Normal  VIII: hearing Not tested  IX: soft palate elevation  Normal  IX,X: gag reflex Present  XI: trapezius strength  5/5  XI: sternocleidomastoid strength 5/5  XI: neck flexion strength  5/5  XII: tongue strength  Normal    Data Review Lab Results  Component Value Date   WBC 9.9 03/26/2023   HGB 14.0 03/26/2023   HCT 43.9 03/26/2023   MCV 87.1 03/26/2023   PLT 322 03/26/2023   Lab Results  Component Value Date   NA 139 03/26/2023   K 4.2 03/26/2023   CL 103  03/26/2023   CO2 28 03/26/2023   BUN 21 (H) 03/26/2023   CREATININE 0.84 03/26/2023   GLUCOSE 92 03/26/2023   No results found for: "INR", "PROTIME"  Assessment/Plan: L3-4 spondylolisthesis, spinal stenosis, facet arthropathy, disc generation, lumbago, lumbar radiculopathy, neurogenic claudication. I have discussed the situation with the patient.  I reviewed her imaging studies with her and pointed out the abnormalities.  We have discussed the various treatment options including surgery.  I have described the surgical treatment option of an L3-4 decompression, instrumentation and fusion.  I have shown her surgical models.  I have given her a surgical pamphlet.  We have discussed the risk, benefits, alternatives, expected postop course, and likelihood of achieving our goals with surgery.  I have answered all her questions.  She has decided proceed with surgery.   Cristi Loron 04/08/2023 12:18 PM

## 2023-04-09 DIAGNOSIS — R519 Headache, unspecified: Secondary | ICD-10-CM | POA: Diagnosis not present

## 2023-04-09 DIAGNOSIS — M4316 Spondylolisthesis, lumbar region: Secondary | ICD-10-CM | POA: Diagnosis not present

## 2023-04-09 DIAGNOSIS — Z79899 Other long term (current) drug therapy: Secondary | ICD-10-CM | POA: Diagnosis not present

## 2023-04-09 DIAGNOSIS — M5116 Intervertebral disc disorders with radiculopathy, lumbar region: Secondary | ICD-10-CM | POA: Diagnosis not present

## 2023-04-09 DIAGNOSIS — I10 Essential (primary) hypertension: Secondary | ICD-10-CM | POA: Diagnosis not present

## 2023-04-09 DIAGNOSIS — E119 Type 2 diabetes mellitus without complications: Secondary | ICD-10-CM | POA: Diagnosis not present

## 2023-04-09 DIAGNOSIS — Z7984 Long term (current) use of oral hypoglycemic drugs: Secondary | ICD-10-CM | POA: Diagnosis not present

## 2023-04-09 DIAGNOSIS — K219 Gastro-esophageal reflux disease without esophagitis: Secondary | ICD-10-CM | POA: Diagnosis not present

## 2023-04-09 DIAGNOSIS — Z791 Long term (current) use of non-steroidal anti-inflammatories (NSAID): Secondary | ICD-10-CM | POA: Diagnosis not present

## 2023-04-09 DIAGNOSIS — M48062 Spinal stenosis, lumbar region with neurogenic claudication: Secondary | ICD-10-CM | POA: Diagnosis not present

## 2023-04-09 DIAGNOSIS — M1990 Unspecified osteoarthritis, unspecified site: Secondary | ICD-10-CM | POA: Diagnosis not present

## 2023-04-09 LAB — GLUCOSE, CAPILLARY: Glucose-Capillary: 99 mg/dL (ref 70–99)

## 2023-04-09 MED ORDER — CYCLOBENZAPRINE HCL 10 MG PO TABS: 10.0000 mg | ORAL_TABLET | Freq: Three times a day (TID) | ORAL | 0 refills | Status: DC | PRN

## 2023-04-09 MED ORDER — PREGABALIN 75 MG PO CAPS: 75.0000 mg | ORAL_CAPSULE | Freq: Two times a day (BID) | ORAL | Status: DC

## 2023-04-09 MED ORDER — PANTOPRAZOLE SODIUM 40 MG PO TBEC: 40.0000 mg | DELAYED_RELEASE_TABLET | Freq: Every day | ORAL | Status: DC

## 2023-04-09 MED ORDER — DOCUSATE SODIUM 100 MG PO CAPS: 100.0000 mg | ORAL_CAPSULE | Freq: Two times a day (BID) | ORAL | 0 refills | Status: DC

## 2023-04-09 MED ORDER — EMPAGLIFLOZIN 10 MG PO TABS: 10.0000 mg | ORAL_TABLET | Freq: Every day | ORAL | Status: DC

## 2023-04-09 MED ORDER — ATORVASTATIN CALCIUM 10 MG PO TABS: 10.0000 mg | ORAL_TABLET | Freq: Every day | ORAL | Status: DC

## 2023-04-09 MED ORDER — LISINOPRIL 10 MG PO TABS: 10.0000 mg | ORAL_TABLET | Freq: Every day | ORAL | Status: DC

## 2023-04-09 MED ORDER — HYDROCODONE-ACETAMINOPHEN 10-325 MG PO TABS: 1.0000 | ORAL_TABLET | ORAL | 0 refills | Status: DC | PRN

## 2023-04-09 NOTE — Discharge Summary (Signed)
Physician Discharge Summary  Patient ID: Jamie Hall MRN: [id number] DOB/AGE: [birth year] 55 y.o.  Admit date: 04/08/2023 Discharge date: 04/09/2023  Admission Diagnoses: Lumbar spondylolisthesis, lumbar facet neuropathy, lumbago, neurogenic claudication, lumbar radiculopathy  Discharge Diagnoses: The same Principal Problem:   Spondylolisthesis of lumbar region   Discharged Condition: good  Hospital Course: I performed an L3-4 decompression, instrumentation and fusion on the patient on 04/08/2023.  The surgery went well.  The patient's postoperative course was unremarkable.  On postop day #1 the patient requested discharge home.  She was given verbal and written discharge instructions.  All her questions were answered.  Consults: PT, care management Significant Diagnostic Studies: None Treatments: L3-4 decompression, instrumentation and fusion. Discharge Exam: Blood pressure (!) 114/50, pulse 65, temperature 98.9 F (37.2 C), temperature source Oral, resp. rate 18, height 5\' 3"  (1.6 m), weight 92.2 kg, SpO2 96%. The patient is alert and pleasant.  She looks well.  Her strength is normal.  Disposition: Home  Discharge Instructions     Call MD for:  difficulty breathing, headache or visual disturbances   Complete by: As directed    Call MD for:  extreme fatigue   Complete by: As directed    Call MD for:  hives   Complete by: As directed    Call MD for:  persistant dizziness or light-headedness   Complete by: As directed    Call MD for:  persistant nausea and vomiting   Complete by: As directed    Call MD for:  redness, tenderness, or signs of infection (pain, swelling, redness, odor or green/yellow discharge around incision site)   Complete by: As directed    Call MD for:  severe uncontrolled pain   Complete by: As directed    Call MD for:  temperature >100.4   Complete by: As directed    Diet - low sodium heart healthy   Complete by: As directed    Discharge  instructions   Complete by: As directed    Call [phone] for a followup appointment. Take a stool softener while you are using pain medications.   Driving Restrictions   Complete by: As directed    Do not drive for 2 weeks.   Increase activity slowly   Complete by: As directed    Lifting restrictions   Complete by: As directed    Do not lift more than 5 pounds. No excessive bending or twisting.   May shower / Bathe   Complete by: As directed    Remove the dressing for 3 days after surgery.  You may shower, but leave the incision alone.   Remove dressing in 48 hours   Complete by: As directed       Allergies as of 04/09/2023       Reactions   Bee Venom Swelling   EDEMA   Benzoyl Peroxide Other (See Comments)   Makes eyes shut   Ceftin [cefuroxime] Swelling   Throat swelling   Clindamycin/lincomycin Hives   Codeine Hives   Dilaudid [hydromorphone] Hives   Hydrocodone    itching   Oxycodone-acetaminophen Hives   Penicillins Hives   Tape Other (See Comments)   Blisters all adhesives likes paper tape        Medication List     STOP taking these medications    ibuprofen 200 MG tablet Commonly known as: ADVIL   meloxicam 15 MG tablet Commonly known as: MOBIC       TAKE these medications    atorvastatin  10 MG tablet Commonly known as: LIPITOR TAKE 1 TABLET BY MOUTH EVERYDAY AT BEDTIME   cyclobenzaprine 10 MG tablet Commonly known as: FLEXERIL Take 1 tablet (10 mg total) by mouth 3 (three) times daily as needed for muscle spasms.   Dexcom G6 Receiver Devi Use as directed   Dexcom G6 Sensor Misc Change sensor every 10 days   Dexcom G6 Transmitter Misc USE AS DIRECTED   docusate sodium 100 MG capsule Commonly known as: COLACE Take 1 capsule (100 mg total) by mouth 2 (two) times daily.   empagliflozin 10 MG Tabs tablet Commonly known as: Jardiance Take 1 tablet (10 mg total) by mouth daily before breakfast.   HYDROcodone-acetaminophen 10-325 MG  tablet Commonly known as: NORCO Take 1 tablet by mouth every 4 (four) hours as needed for moderate pain.   lansoprazole 30 MG capsule Commonly known as: PREVACID Take 1 capsule (30 mg total) by mouth daily. What changed:  when to take this reasons to take this   levonorgestrel 20 MCG/24HR IUD Commonly known as: MIRENA 1 each by Intrauterine route once.   lisinopril 10 MG tablet Commonly known as: ZESTRIL TAKE 1 TABLET BY MOUTH EVERY DAY   metFORMIN 500 MG tablet Commonly known as: GLUCOPHAGE TAKE 1 TABLET BY MOUTH TWO TIMES DAILY WITH A MEAL   multivitamin with minerals Tabs tablet Take 1 tablet by mouth in the morning.   pregabalin 75 MG capsule Commonly known as: LYRICA Take 75 mg by mouth 2 (two) times daily.         Signed: Cristi Loron 04/09/2023, 6:40 AM

## 2023-04-09 NOTE — Progress Notes (Addendum)
Patient awaiting family for discharge home, Patient in no acute distress nor complaints of pain nor discomfort; incision on back is clean, dry and intact; No c/o pain at this time. Room was checked and accounted for all patient's belongings; discharge instructions concerning her medications, incision care, follow up appointment and when to call the doctor as needed were all discussed with patient by RN and she expressed understanding on the instructions given

## 2023-04-09 NOTE — Evaluation (Signed)
Occupational Therapy Evaluation Patient Details Name: KHRIS REIL MRN: [id number] DOB: Nov 03, 1965 Today's Date: 04/09/2023   History of Present Illness Pt is a 57 y.o. female s/p L3-4 PLIF. PMH significant for DMII, GERD, HTN, OA, R TKA 2022.   Clinical Impression   PTA, pt lived with husband and was independent. Upon eval, pt performing all ADL with mod I. Pt educated and demonstrating use of compensatory techniques for bed mobility, LB ADL, grooming, toileting, and shower transfers within precautions. All education provided and questions answered. Recommending discharge home with no OT follow up at this time. OT to sign off. Please re-consult if change in status.          If plan is discharge home, recommend the following: Other (comment) (on pt request)    Functional Status Assessment  Patient has had a recent decline in their functional status and demonstrates the ability to make significant improvements in function in a reasonable and predictable amount of time.  Equipment Recommendations  None recommended by OT    Recommendations for Other Services       Precautions / Restrictions Precautions Precautions: Back Precaution Booklet Issued: Yes (comment) Precaution Comments: All precautions reviewed within the context of ADL Required Braces or Orthoses: Spinal Brace Spinal Brace:  (for comfort; brace not present in room, pt reports it is at home) Restrictions Weight Bearing Restrictions: No      Mobility Bed Mobility Overal bed mobility: Modified Independent                  Transfers Overall transfer level: Modified independent Equipment used: None                      Balance Overall balance assessment: Mild deficits observed, not formally tested (Intermittently holding rail in hall but for comfort)                                         ADL either performed or assessed with clinical judgement   ADL Overall ADL's : Modified  independent                                       General ADL Comments: Pt demonstrating appropriate use of compensatory techniques during all ADL. All education provided     Vision Ability to See in Adequate Light: 0 Adequate Patient Visual Report: No change from baseline Vision Assessment?: No apparent visual deficits     Perception Perception: Not tested       Praxis Praxis: Not tested       Pertinent Vitals/Pain Pain Assessment Pain Assessment: Faces Faces Pain Scale: Hurts a little bit Pain Location: operative site Pain Descriptors / Indicators: Discomfort, Guarding Pain Intervention(s): Limited activity within patient's tolerance, Monitored during session     Extremity/Trunk Assessment Upper Extremity Assessment Upper Extremity Assessment: Overall WFL for tasks assessed   Lower Extremity Assessment Lower Extremity Assessment: Defer to PT evaluation   Cervical / Trunk Assessment Cervical / Trunk Assessment: Back Surgery   Communication Communication Communication: No apparent difficulties   Cognition Arousal: Alert Behavior During Therapy: WFL for tasks assessed/performed Overall Cognitive Status: Within Functional Limits for tasks assessed  General Comments  VSS    Exercises     Shoulder Instructions      Home Living Family/patient expects to be discharged to:: Private residence Living Arrangements: Spouse/significant other Available Help at Discharge: Family Type of Home: House Home Access: Stairs to enter Secretary/administrator of Steps: 8 Entrance Stairs-Rails: Right Home Layout: Other (Comment) (Split level home with 16 steps total from garage to top level. Pt describes as 8 and 8)     Bathroom Shower/Tub: Producer, television/film/video: Handicapped height     Home Equipment: Agricultural consultant (2 wheels);Shower seat          Prior Functioning/Environment Prior  Level of Function : Independent/Modified Independent;Driving             Mobility Comments: No AD ADLs Comments: Endoscopy RN        OT Problem List: Decreased strength;Impaired balance (sitting and/or standing);Decreased activity tolerance;Impaired sensation;Pain      OT Treatment/Interventions:      OT Goals(Current goals can be found in the care plan section) Acute Rehab OT Goals Patient Stated Goal: go home OT Goal Formulation: With patient  OT Frequency:      Co-evaluation              AM-PAC OT "6 Clicks" Daily Activity     Outcome Measure Help from another person eating meals?: None Help from another person taking care of personal grooming?: None Help from another person toileting, which includes using toliet, bedpan, or urinal?: None Help from another person bathing (including washing, rinsing, drying)?: None Help from another person to put on and taking off regular upper body clothing?: None Help from another person to put on and taking off regular lower body clothing?: None 6 Click Score: 24   End of Session Nurse Communication: Mobility status  Activity Tolerance: Patient tolerated treatment well Patient left: in bed;with call bell/phone within reach  OT Visit Diagnosis: Unsteadiness on feet (R26.81);Pain;Muscle weakness (generalized) (M62.81) Pain - part of body:  (back)                Time: 4696-2952 OT Time Calculation (min): 20 min Charges:  OT General Charges $OT Visit: 1 Visit OT Evaluation $OT Eval Low Complexity: 1 Low  Tyler Deis, OTR/L [facility] Office: [phone]   Myrla Halsted 04/09/2023, 8:29 AM

## 2023-04-09 NOTE — Progress Notes (Signed)
PT Cancellation Note  Patient Details Name: Jamie Hall MRN: [id number] DOB: Oct 15, 1965   Cancelled Treatment:    Reason Eval/Treat Not Completed: PT screened, no needs identified, will sign off. Pt declines the need for PT evaluation at this time, no concerns with mobility or stair negotiation at this time. Per discussion with OT the pt is mobilizing well. PT will sign off at this time. If mobility concerns arise prior to discharge please re-consult.   Arlyss Gandy 04/09/2023, 8:26 AM

## 2023-04-12 ENCOUNTER — Encounter (HOSPITAL_COMMUNITY): Payer: Self-pay | Admitting: Neurosurgery

## 2023-04-16 DIAGNOSIS — M48062 Spinal stenosis, lumbar region with neurogenic claudication: Secondary | ICD-10-CM | POA: Diagnosis not present

## 2023-04-16 DIAGNOSIS — M5116 Intervertebral disc disorders with radiculopathy, lumbar region: Secondary | ICD-10-CM | POA: Diagnosis not present

## 2023-04-16 DIAGNOSIS — M4726 Other spondylosis with radiculopathy, lumbar region: Secondary | ICD-10-CM | POA: Diagnosis not present

## 2023-04-16 DIAGNOSIS — M4316 Spondylolisthesis, lumbar region: Secondary | ICD-10-CM | POA: Diagnosis not present

## 2023-04-21 MED FILL — Heparin Sodium (Porcine) Inj 1000 Unit/ML: INTRAMUSCULAR | Qty: 30 | Status: AC

## 2023-04-29 ENCOUNTER — Other Ambulatory Visit: Payer: Self-pay | Admitting: Family Medicine

## 2023-05-07 DIAGNOSIS — M4316 Spondylolisthesis, lumbar region: Secondary | ICD-10-CM | POA: Diagnosis not present

## 2023-05-11 ENCOUNTER — Ambulatory Visit (INDEPENDENT_AMBULATORY_CARE_PROVIDER_SITE_OTHER): Payer: 59 | Admitting: Family Medicine

## 2023-05-11 VITALS — BP 112/64 | HR 75 | Temp 97.7°F | Ht 63.0 in | Wt 193.5 lb

## 2023-05-11 DIAGNOSIS — E785 Hyperlipidemia, unspecified: Secondary | ICD-10-CM

## 2023-05-11 DIAGNOSIS — I10 Essential (primary) hypertension: Secondary | ICD-10-CM | POA: Diagnosis not present

## 2023-05-11 DIAGNOSIS — E1169 Type 2 diabetes mellitus with other specified complication: Secondary | ICD-10-CM | POA: Diagnosis not present

## 2023-05-11 DIAGNOSIS — Z7984 Long term (current) use of oral hypoglycemic drugs: Secondary | ICD-10-CM | POA: Diagnosis not present

## 2023-05-11 DIAGNOSIS — E119 Type 2 diabetes mellitus without complications: Secondary | ICD-10-CM

## 2023-05-11 LAB — BASIC METABOLIC PANEL
BUN: 21 mg/dL (ref 6–23)
CO2: 29 meq/L (ref 19–32)
Calcium: 10.4 mg/dL (ref 8.4–10.5)
Chloride: 101 meq/L (ref 96–112)
Creatinine, Ser: 0.66 mg/dL (ref 0.40–1.20)
GFR: 98.81 mL/min (ref >60.00)
Glucose, Bld: 84 mg/dL (ref 70–99)
Potassium: 4.8 meq/L (ref 3.5–5.1)
Sodium: 141 meq/L (ref 135–145)

## 2023-05-11 LAB — CBC WITH DIFFERENTIAL/PLATELET
Basophils Absolute: 0.1 10*3/uL (ref 0.0–0.1)
Basophils Relative: 0.7 % (ref 0.0–3.0)
Eosinophils Absolute: 0.2 10*3/uL (ref 0.0–0.7)
Eosinophils Relative: 1.2 % (ref 0.0–5.0)
HCT: 41 % (ref 36.0–46.0)
Hemoglobin: 12.9 g/dL (ref 12.0–15.0)
Lymphocytes Relative: 21.4 % (ref 12.0–46.0)
Lymphs Abs: 2.8 10*3/uL (ref 0.7–4.0)
MCHC: 31.6 g/dL (ref 30.0–36.0)
MCV: 85.9 fL (ref 78.0–100.0)
Monocytes Absolute: 1 10*3/uL (ref 0.1–1.0)
Monocytes Relative: 7.4 % (ref 3.0–12.0)
Neutro Abs: 9.1 10*3/uL — ABNORMAL HIGH (ref 1.4–7.7)
Neutrophils Relative %: 69.3 % (ref 43.0–77.0)
Platelets: 578 10*3/uL — ABNORMAL HIGH (ref 150.0–400.0)
RBC: 4.77 Mil/uL (ref 3.87–5.11)
RDW: 16 % — ABNORMAL HIGH (ref 11.5–15.5)
WBC: 13.1 10*3/uL — ABNORMAL HIGH (ref 4.0–10.5)

## 2023-05-11 LAB — TSH: TSH: 0.92 u[IU]/mL (ref 0.35–5.50)

## 2023-05-11 LAB — LIPID PANEL
Cholesterol: 137 mg/dL (ref 0–200)
HDL: 46.1 mg/dL (ref >39.00)
LDL Cholesterol: 73 mg/dL (ref 0–99)
NonHDL: 91.06
Total CHOL/HDL Ratio: 3
Triglycerides: 92 mg/dL (ref 0.0–149.0)
VLDL: 18.4 mg/dL (ref 0.0–40.0)

## 2023-05-11 LAB — HEPATIC FUNCTION PANEL
ALT: 22 U/L (ref 0–35)
AST: 14 U/L (ref 0–37)
Albumin: 4.4 g/dL (ref 3.5–5.2)
Alkaline Phosphatase: 70 U/L (ref 39–117)
Bilirubin, Direct: 0.1 mg/dL (ref 0.0–0.3)
Total Bilirubin: 0.4 mg/dL (ref 0.2–1.2)
Total Protein: 7.5 g/dL (ref 6.0–8.3)

## 2023-05-11 LAB — HEMOGLOBIN A1C: Hgb A1c MFr Bld: 6.6 % — ABNORMAL HIGH (ref 4.6–6.5)

## 2023-05-11 NOTE — Assessment & Plan Note (Signed)
Chronic problem.  On Lipitor 10mg  daily w/o difficulty.  Check labs.  Adjust meds prn.

## 2023-05-11 NOTE — Progress Notes (Signed)
Subjective:    Patient ID: Jamie Hall, female    DOB: Jul 10, 1966, 57 y.o.   MRN: [id number]  HPI DM- chronic problem, on Jardiance 10mg  daily, Metformin 500mg  BID.  UTD on foot exam, microalbumin.  Due for eye exam.  Pt is down 30 lbs!  Pt has been doing water exercises, walking 2-5 miles/day.  Pt reports CBGs have been very labile due to pain level and prednisone use.  Denies symptomatic lows.  HTN- chronic problem, on Lisinopril 10mg  daily w/ good control. Denies CP, SOB, HA's, visual changes, edema.  Hyperlipidemia- chronic problem, on Lipitor 10mg  daily.  Denies abd pain, N/V.   Review of Systems For ROS see HPI     Objective:   Physical Exam Vitals reviewed.  Constitutional:      General: She is not in acute distress.    Appearance: Normal appearance. She is well-developed. She is not ill-appearing.  HENT:     Head: Normocephalic and atraumatic.  Eyes:     Conjunctiva/sclera: Conjunctivae normal.     Pupils: Pupils are equal, round, and reactive to light.  Neck:     Thyroid: No thyromegaly.  Cardiovascular:     Rate and Rhythm: Normal rate and regular rhythm.     Pulses: Normal pulses.     Heart sounds: Normal heart sounds. No murmur heard. Pulmonary:     Effort: Pulmonary effort is normal. No respiratory distress.     Breath sounds: Normal breath sounds.  Abdominal:     General: There is no distension.     Palpations: Abdomen is soft.     Tenderness: There is no abdominal tenderness.  Musculoskeletal:     Cervical back: Normal range of motion and neck supple.     Right lower leg: No edema.     Left lower leg: No edema.  Lymphadenopathy:     Cervical: No cervical adenopathy.  Skin:    General: Skin is warm and dry.  Neurological:     General: No focal deficit present.     Mental Status: She is alert and oriented to person, place, and time.  Psychiatric:        Mood and Affect: Mood normal.        Behavior: Behavior normal.        Thought Content:  Thought content normal.           Assessment & Plan:

## 2023-05-11 NOTE — Patient Instructions (Signed)
Follow up in 3-4 months to recheck sugars We'll notify you of your lab results and make any changes if needed Keep up the good work on healthy diet and regular exercise- you're doing AMAZING!! Schedule your eye exam and have them send a copy of their report Call with any questions or concerns Stay Safe!  Stay Healthy! Hang in there!!!

## 2023-05-11 NOTE — Assessment & Plan Note (Signed)
Chronic problem.  Excellent control on Lisinopril 10mg  daily.  Currently asymptomatic.  Check labs due to ACE use but no anticipated med changes.  Will follow.

## 2023-05-11 NOTE — Assessment & Plan Note (Signed)
Chronic problem, on Jardiance 10mg  and Metformin 500mg  BID w/ hx of good control.  Sugars have been elevated recently due to pain and prednisone.  UTD on foot exam, microalbumin.  Due for eye exam- pt to schedule.  Check labs and adjust meds prn.

## 2023-05-12 ENCOUNTER — Telehealth: Payer: Self-pay

## 2023-05-12 NOTE — Telephone Encounter (Signed)
-----   Message from Neena Rhymes sent at 05/12/2023  7:32 AM EDT ----- Labs look great!  Your white blood cell count is mildly elevated but this is often seen when taking Prednisone and is not a cause for concern.  No changes at this time

## 2023-05-25 DIAGNOSIS — M4316 Spondylolisthesis, lumbar region: Secondary | ICD-10-CM | POA: Diagnosis not present

## 2023-06-15 DIAGNOSIS — Z008 Encounter for other general examination: Secondary | ICD-10-CM | POA: Diagnosis not present

## 2023-06-21 ENCOUNTER — Other Ambulatory Visit: Payer: Self-pay | Admitting: Family Medicine

## 2023-07-30 DIAGNOSIS — M4316 Spondylolisthesis, lumbar region: Secondary | ICD-10-CM | POA: Diagnosis not present

## 2023-07-30 DIAGNOSIS — M542 Cervicalgia: Secondary | ICD-10-CM | POA: Diagnosis not present

## 2023-08-01 ENCOUNTER — Other Ambulatory Visit: Payer: Self-pay | Admitting: Family Medicine

## 2023-08-04 DIAGNOSIS — M47812 Spondylosis without myelopathy or radiculopathy, cervical region: Secondary | ICD-10-CM | POA: Insufficient documentation

## 2023-08-05 ENCOUNTER — Encounter: Payer: Self-pay | Admitting: Family Medicine

## 2023-08-05 ENCOUNTER — Ambulatory Visit: Payer: 59 | Admitting: Family Medicine

## 2023-08-05 VITALS — BP 110/74 | HR 74 | Temp 97.4°F | Wt 191.1 lb

## 2023-08-05 DIAGNOSIS — E119 Type 2 diabetes mellitus without complications: Secondary | ICD-10-CM

## 2023-08-05 LAB — BASIC METABOLIC PANEL
BUN: 18 mg/dL (ref 6–23)
CO2: 30 meq/L (ref 19–32)
Calcium: 10.1 mg/dL (ref 8.4–10.5)
Chloride: 104 meq/L (ref 96–112)
Creatinine, Ser: 0.7 mg/dL (ref 0.40–1.20)
GFR: 97.26 mL/min (ref >60.00)
Glucose, Bld: 93 mg/dL (ref 70–99)
Potassium: 4.2 meq/L (ref 3.5–5.1)
Sodium: 140 meq/L (ref 135–145)

## 2023-08-05 LAB — HEMOGLOBIN A1C: Hgb A1c MFr Bld: 6.2 % (ref 4.6–6.5)

## 2023-08-05 NOTE — Progress Notes (Signed)
   Subjective:    Patient ID: Jamie Hall, female    DOB: 08-24-1965, 58 y.o.   MRN: [id number]  HPI DM- chronic problem, on Jardiance  10mg  daily, Metformin  500mg  BID.  On ACE for renal protection.  Pt has eye exam scheduled.  UTD on microalbumin.  Foot exam is due this month.  No CP, SOB, HA's, visual changes, abd pain, N/V.  Denies symptomatic lows.  No numbness/tingling of hands/feet.  Walking 2-5 miles/day   Review of Systems For ROS see HPI     Objective:   Physical Exam Vitals reviewed.  Constitutional:      General: She is not in acute distress.    Appearance: Normal appearance. She is well-developed. She is obese. She is not ill-appearing.  HENT:     Head: Normocephalic and atraumatic.  Eyes:     Conjunctiva/sclera: Conjunctivae normal.     Pupils: Pupils are equal, round, and reactive to light.  Neck:     Thyroid : No thyromegaly.  Cardiovascular:     Rate and Rhythm: Normal rate and regular rhythm.     Heart sounds: Normal heart sounds. No murmur heard. Pulmonary:     Effort: Pulmonary effort is normal. No respiratory distress.     Breath sounds: Normal breath sounds.  Abdominal:     General: There is no distension.     Palpations: Abdomen is soft.     Tenderness: There is no abdominal tenderness.  Musculoskeletal:     Cervical back: Normal range of motion and neck supple.     Right lower leg: No edema.     Left lower leg: No edema.  Lymphadenopathy:     Cervical: No cervical adenopathy.  Skin:    General: Skin is warm and dry.  Neurological:     General: No focal deficit present.     Mental Status: She is alert and oriented to person, place, and time.  Psychiatric:        Mood and Affect: Mood normal.        Behavior: Behavior normal.        Thought Content: Thought content normal.           Assessment & Plan:

## 2023-08-05 NOTE — Patient Instructions (Signed)
 Schedule your complete physical in 3-4 months We'll notify you of your lab results and make any changes if needed Keep up the good work on healthy diet and regular exercise- you're doing great! Call with any questions or concerns Stay Safe!  Stay Healthy! Happy New Year!

## 2023-08-05 NOTE — Assessment & Plan Note (Signed)
 Chronic problem.  Currently on Jardiance 10mg  daily, Metformin 500mg  BID.  UTD on microalbumin.  Foot exam done today.  Eye exam scheduled.  Currently asymptomatic.  Check labs.  Adjust meds prn

## 2023-08-09 ENCOUNTER — Telehealth: Payer: Self-pay

## 2023-08-09 NOTE — Telephone Encounter (Signed)
 Pt has been notified.

## 2023-08-09 NOTE — Telephone Encounter (Signed)
-----   Message from Neena Rhymes sent at 08/09/2023  7:47 AM EST ----- Labs look great!  No changes at this time

## 2023-08-11 ENCOUNTER — Ambulatory Visit: Payer: 59 | Admitting: Family Medicine

## 2023-08-24 ENCOUNTER — Other Ambulatory Visit: Payer: Self-pay

## 2023-08-24 MED ORDER — EMPAGLIFLOZIN 10 MG PO TABS: 10.0000 mg | ORAL_TABLET | Freq: Every day | ORAL | 1 refills | Status: DC

## 2023-08-24 NOTE — Telephone Encounter (Signed)
Copied from CRM [id number]. Topic: Clinical - Prescription Issue >> Aug 24, 2023  2:13 PM Jamie Hall wrote: Reason for CRM: Patient states she is staying in [place] and is requesting her [place] Pepper to be sent to a pharmacy there as she will run out on Friday.  Patient is requesting [facility] to [facility] [id number] [place], [place] [address] Fax #[phone] Phone #[phone]

## 2023-08-24 NOTE — Telephone Encounter (Signed)
Requested Prescriptions   Pending Prescriptions Disp Refills   empagliflozin (JARDIANCE) 10 MG TABS tablet 90 tablet 1    Sig: Take 1 tablet (10 mg total) by mouth daily before breakfast.     Date of patient request: 08/24/2023 Last office visit: 08/05/2023 Upcoming visit: 11/23/2023 Date of last refill: 10/26/2022 Last refill amount: 90x1    Pharmacy changed last fill was for 6 months and was 9 months ago please advise if this is appropriate?

## 2023-09-24 ENCOUNTER — Other Ambulatory Visit: Payer: Self-pay | Admitting: Family Medicine

## 2023-09-28 ENCOUNTER — Other Ambulatory Visit: Payer: Self-pay

## 2023-09-28 MED ORDER — DEXCOM G6 TRANSMITTER MISC: 0 refills | Status: DC

## 2023-09-29 ENCOUNTER — Other Ambulatory Visit: Payer: Self-pay | Admitting: Family Medicine

## 2023-10-04 LAB — HM DIABETES EYE EXAM

## 2023-10-08 ENCOUNTER — Encounter: Payer: Self-pay | Admitting: Family Medicine

## 2023-10-31 ENCOUNTER — Other Ambulatory Visit: Payer: Self-pay | Admitting: Orthopaedic Surgery

## 2023-11-23 ENCOUNTER — Ambulatory Visit: Payer: 59 | Admitting: Family Medicine

## 2023-11-23 ENCOUNTER — Encounter: Payer: Self-pay | Admitting: Family Medicine

## 2023-11-23 VITALS — BP 118/64 | HR 67 | Temp 97.9°F | Ht 63.0 in | Wt 177.4 lb

## 2023-11-23 DIAGNOSIS — Z0000 Encounter for general adult medical examination without abnormal findings: Secondary | ICD-10-CM | POA: Diagnosis not present

## 2023-11-23 DIAGNOSIS — Z7984 Long term (current) use of oral hypoglycemic drugs: Secondary | ICD-10-CM

## 2023-11-23 DIAGNOSIS — E559 Vitamin D deficiency, unspecified: Secondary | ICD-10-CM | POA: Diagnosis not present

## 2023-11-23 DIAGNOSIS — E119 Type 2 diabetes mellitus without complications: Secondary | ICD-10-CM | POA: Diagnosis not present

## 2023-11-23 LAB — CBC WITH DIFFERENTIAL/PLATELET
Basophils Absolute: 0.1 10*3/uL (ref 0.0–0.1)
Basophils Relative: 0.8 % (ref 0.0–3.0)
Eosinophils Absolute: 0.3 10*3/uL (ref 0.0–0.7)
Eosinophils Relative: 3.7 % (ref 0.0–5.0)
HCT: 44.3 % (ref 36.0–46.0)
Hemoglobin: 14.3 g/dL (ref 12.0–15.0)
Lymphocytes Relative: 22.3 % (ref 12.0–46.0)
Lymphs Abs: 1.8 10*3/uL (ref 0.7–4.0)
MCHC: 32.2 g/dL (ref 30.0–36.0)
MCV: 87.3 fl (ref 78.0–100.0)
Monocytes Absolute: 0.5 10*3/uL (ref 0.1–1.0)
Monocytes Relative: 5.9 % (ref 3.0–12.0)
Neutro Abs: 5.4 10*3/uL (ref 1.4–7.7)
Neutrophils Relative %: 67.3 % (ref 43.0–77.0)
Platelets: 357 10*3/uL (ref 150.0–400.0)
RBC: 5.08 Mil/uL (ref 3.87–5.11)
RDW: 15.7 % — ABNORMAL HIGH (ref 11.5–15.5)
WBC: 8 10*3/uL (ref 4.0–10.5)

## 2023-11-23 LAB — MICROALBUMIN / CREATININE URINE RATIO
Creatinine,U: 222.8 mg/dL
Microalb Creat Ratio: 9.9 mg/g (ref 0.0–30.0)
Microalb, Ur: 2.2 mg/dL — ABNORMAL HIGH (ref 0.0–1.9)

## 2023-11-23 LAB — BASIC METABOLIC PANEL WITH GFR
BUN: 18 mg/dL (ref 6–23)
CO2: 31 meq/L (ref 19–32)
Calcium: 10 mg/dL (ref 8.4–10.5)
Chloride: 104 meq/L (ref 96–112)
Creatinine, Ser: 0.73 mg/dL (ref 0.40–1.20)
GFR: 92.29 mL/min (ref >60.00)
Glucose, Bld: 92 mg/dL (ref 70–99)
Potassium: 4 meq/L (ref 3.5–5.1)
Sodium: 141 meq/L (ref 135–145)

## 2023-11-23 LAB — LIPID PANEL
Cholesterol: 101 mg/dL (ref 0–200)
HDL: 45.9 mg/dL (ref >39.00)
LDL Cholesterol: 45 mg/dL (ref 0–99)
NonHDL: 55.25
Total CHOL/HDL Ratio: 2
Triglycerides: 49 mg/dL (ref 0.0–149.0)
VLDL: 9.8 mg/dL (ref 0.0–40.0)

## 2023-11-23 LAB — HEPATIC FUNCTION PANEL
ALT: 17 U/L (ref 0–35)
AST: 17 U/L (ref 0–37)
Albumin: 4.5 g/dL (ref 3.5–5.2)
Alkaline Phosphatase: 59 U/L (ref 39–117)
Bilirubin, Direct: 0.1 mg/dL (ref 0.0–0.3)
Total Bilirubin: 0.4 mg/dL (ref 0.2–1.2)
Total Protein: 7.2 g/dL (ref 6.0–8.3)

## 2023-11-23 LAB — HEMOGLOBIN A1C: Hgb A1c MFr Bld: 6.1 % (ref 4.6–6.5)

## 2023-11-23 NOTE — Patient Instructions (Signed)
 Follow up in 6 months to recheck sugar, BP, cholesterol We'll notify you of your lab results and make any changes if needed Keep up the good work on healthy diet and regular exercise- you look AMAZING! Call with any questions or concerns Stay Safe!  Stay Healthy! ENJOY GRADUATION!!!

## 2023-11-23 NOTE — Assessment & Plan Note (Signed)
 Pt's PE WNL.  UTD on pap, mammo, colonoscopy.  She declines Tdap, PNA, shingles.  Check labs.  Anticipatory guidance provided.

## 2023-11-23 NOTE — Progress Notes (Signed)
   Subjective:    Patient ID: Jamie Hall, female    DOB: 10/03/1965, 58 y.o.   MRN: [id number]  HPI CPE- due for Tdap, PNA, shingles.  She declines.  UTD on foot exam, eye exam, pap, mammo, colonoscopy  Patient Care Team    Relationship Specialty Notifications Start End  Jess Morita, MD PCP - General Family Medicine  01/17/18   Key, Landa Pine, NP Nurse Practitioner Gynecology  02/03/19   [facility]    02/02/23   Garry Kansas, MD Consulting Physician Neurosurgery  05/11/23     Health Maintenance  Topic Date Due   DTaP/Tdap/Td (1 - Tdap) Never done   Pneumococcal Vaccine 40-28 Years old (1 of 2 - PCV) Never done   Zoster Vaccines- Shingrix (1 of 2) Never done   COVID-19 Vaccine (4 - 2024-25 season) 03/28/2023   Diabetic kidney evaluation - Urine ACR  01/15/2024   HEMOGLOBIN A1C  02/02/2024   INFLUENZA VACCINE  02/25/2024   Diabetic kidney evaluation - eGFR measurement  08/04/2024   FOOT EXAM  08/04/2024   OPHTHALMOLOGY EXAM  10/03/2024   MAMMOGRAM  12/07/2024   Cervical Cancer Screening (HPV/Pap Cotest)  08/25/2025   Colonoscopy  05/16/2026   Hepatitis C Screening  Completed   HIV Screening  Completed   HPV VACCINES  Aged Out   Meningococcal B Vaccine  Aged Out      Review of Systems Patient reports no vision/ hearing changes, adenopathy,fever, persistant/recurrent hoarseness , swallowing issues, chest pain, palpitations, edema, persistant/recurrent cough, hemoptysis, dyspnea (rest/exertional/paroxysmal nocturnal), gastrointestinal bleeding (melena, rectal bleeding), abdominal pain, significant heartburn, bowel changes, GU symptoms (dysuria, hematuria, incontinence), Gyn symptoms (abnormal  bleeding, pain),  syncope, focal weakness, memory loss, numbness & tingling, skin/hair/nail changes, abnormal bruising or bleeding, anxiety, or depression.   + 15 lb weight loss since January, 61 lbs overall    Objective:   Physical Exam General Appearance:    Alert,  cooperative, no distress, appears stated age  Head:    Normocephalic, without obvious abnormality, atraumatic  Eyes:    PERRL, conjunctiva/corneas clear, EOM's intact both eyes  Ears:    Normal TM's and external ear canals, both ears  Nose:   Nares normal, septum midline, mucosa normal, no drainage    or sinus tenderness  Throat:   Lips, mucosa, and tongue normal; teeth and gums normal  Neck:   Supple, symmetrical, trachea midline, no adenopathy;    Thyroid : no enlargement/tenderness/nodules  Back:     Symmetric, no curvature, ROM normal, no CVA tenderness  Lungs:     Clear to auscultation bilaterally, respirations unlabored  Chest Wall:    No tenderness or deformity   Heart:    Regular rate and rhythm, S1 and S2 normal, no murmur, rub   or gallop  Breast Exam:    Deferred to GYN  Abdomen:     Soft, non-tender, bowel sounds active all four quadrants,    no masses, no organomegaly  Genitalia:    Deferred to GYN  Rectal:    Extremities:   Extremities normal, atraumatic, no cyanosis or edema  Pulses:   2+ and symmetric all extremities  Skin:   Skin color, texture, turgor normal, no rashes or lesions  Lymph nodes:   Cervical, supraclavicular, and axillary nodes normal  Neurologic:   CNII-XII intact, normal strength, sensation and reflexes    throughout          Assessment & Plan:

## 2023-11-24 ENCOUNTER — Encounter: Payer: Self-pay | Admitting: Family Medicine

## 2023-11-24 DIAGNOSIS — I10 Essential (primary) hypertension: Secondary | ICD-10-CM | POA: Diagnosis not present

## 2023-11-24 DIAGNOSIS — Z823 Family history of stroke: Secondary | ICD-10-CM | POA: Diagnosis not present

## 2023-11-24 DIAGNOSIS — Z96651 Presence of right artificial knee joint: Secondary | ICD-10-CM | POA: Diagnosis not present

## 2023-11-24 DIAGNOSIS — M4800 Spinal stenosis, site unspecified: Secondary | ICD-10-CM | POA: Diagnosis not present

## 2023-11-24 DIAGNOSIS — Z87892 Personal history of anaphylaxis: Secondary | ICD-10-CM | POA: Diagnosis not present

## 2023-11-24 DIAGNOSIS — Z7984 Long term (current) use of oral hypoglycemic drugs: Secondary | ICD-10-CM | POA: Diagnosis not present

## 2023-11-24 DIAGNOSIS — Z880 Allergy status to penicillin: Secondary | ICD-10-CM | POA: Diagnosis not present

## 2023-11-24 DIAGNOSIS — E119 Type 2 diabetes mellitus without complications: Secondary | ICD-10-CM | POA: Diagnosis not present

## 2023-11-24 DIAGNOSIS — Z6831 Body mass index (BMI) 31.0-31.9, adult: Secondary | ICD-10-CM | POA: Diagnosis not present

## 2023-11-24 DIAGNOSIS — E785 Hyperlipidemia, unspecified: Secondary | ICD-10-CM | POA: Diagnosis not present

## 2023-11-24 DIAGNOSIS — Z881 Allergy status to other antibiotic agents status: Secondary | ICD-10-CM | POA: Diagnosis not present

## 2023-11-24 DIAGNOSIS — E669 Obesity, unspecified: Secondary | ICD-10-CM | POA: Diagnosis not present

## 2023-11-24 LAB — TSH: TSH: 1.73 u[IU]/mL (ref 0.35–5.50)

## 2023-11-24 LAB — VITAMIN D 25 HYDROXY (VIT D DEFICIENCY, FRACTURES): VITD: 25.08 ng/mL — ABNORMAL LOW (ref 30.00–100.00)

## 2023-11-24 MED ORDER — VITAMIN D (ERGOCALCIFEROL) 1.25 MG (50000 UNIT) PO CAPS: 50000.0000 [IU] | ORAL_CAPSULE | ORAL | 0 refills | Status: DC

## 2023-11-24 NOTE — Addendum Note (Signed)
 Addended by: Chelise Hanger K on: 11/24/2023 09:25 AM   Modules accepted: Orders

## 2023-12-01 ENCOUNTER — Other Ambulatory Visit: Payer: Self-pay | Admitting: Orthopaedic Surgery

## 2023-12-15 DIAGNOSIS — Z1231 Encounter for screening mammogram for malignant neoplasm of breast: Secondary | ICD-10-CM | POA: Diagnosis not present

## 2023-12-15 DIAGNOSIS — Z130 Encounter for screening for diseases of the blood and blood-forming organs and certain disorders involving the immune mechanism: Secondary | ICD-10-CM | POA: Diagnosis not present

## 2023-12-15 DIAGNOSIS — Z01419 Encounter for gynecological examination (general) (routine) without abnormal findings: Secondary | ICD-10-CM | POA: Diagnosis not present

## 2023-12-15 DIAGNOSIS — Z1389 Encounter for screening for other disorder: Secondary | ICD-10-CM | POA: Diagnosis not present

## 2023-12-15 DIAGNOSIS — Z780 Asymptomatic menopausal state: Secondary | ICD-10-CM | POA: Diagnosis not present

## 2023-12-15 DIAGNOSIS — Z30432 Encounter for removal of intrauterine contraceptive device: Secondary | ICD-10-CM | POA: Diagnosis not present

## 2023-12-23 ENCOUNTER — Other Ambulatory Visit: Payer: Self-pay | Admitting: Family Medicine

## 2023-12-23 NOTE — Telephone Encounter (Signed)
 Last Fill: 09/28/23  Last OV: 11/23/23 Next OV: 02/16/24  Routing to provider for review/authorization.

## 2023-12-23 NOTE — Telephone Encounter (Unsigned)
 Copied from CRM [id number]. Topic: Clinical - Medication Refill >> Dec 23, 2023  2:41 PM Howard Macho wrote: Medication: Continuous Glucose Transmitter (DEXCOM G6 TRANSMITTER) MISC(patient stated the pharmacy reached out to the office, but there was no response back)  Has the patient contacted their pharmacy? Yes (Agent: If no, request that the patient contact the pharmacy for the refill. If patient does not wish to contact the pharmacy document the reason why and proceed with request.) (Agent: If yes, when and what did the pharmacy advise?)  This is the patient's preferred pharmacy:  [id number] [place] [place] - [address] [place] [address] Phone: [phone] Fax: [phone]  Is this the correct pharmacy for this prescription? Yes If no, delete pharmacy and type the correct one.   Has the prescription been filled recently? No  Is the patient out of the medication? Yes  Has the patient been seen for an appointment in the last year OR does the patient have an upcoming appointment? Yes  Can we respond through MyChart? Yes  Agent: Please be advised that Rx refills may take up to 3 business days. We ask that you follow-up with your pharmacy.

## 2023-12-24 MED ORDER — DEXCOM G6 TRANSMITTER MISC: 0 refills | Status: DC

## 2023-12-25 ENCOUNTER — Other Ambulatory Visit: Payer: Self-pay | Admitting: Orthopaedic Surgery

## 2024-01-20 ENCOUNTER — Other Ambulatory Visit: Payer: Self-pay | Admitting: Family Medicine

## 2024-01-25 ENCOUNTER — Other Ambulatory Visit: Payer: Self-pay | Admitting: Family Medicine

## 2024-01-25 ENCOUNTER — Other Ambulatory Visit: Payer: Self-pay | Admitting: Physician Assistant

## 2024-02-12 ENCOUNTER — Other Ambulatory Visit: Payer: Self-pay | Admitting: Family Medicine

## 2024-02-14 ENCOUNTER — Ambulatory Visit: Admitting: Family Medicine

## 2024-02-16 ENCOUNTER — Ambulatory Visit: Admitting: Family Medicine

## 2024-02-18 ENCOUNTER — Other Ambulatory Visit: Payer: Self-pay | Admitting: Family Medicine

## 2024-02-19 ENCOUNTER — Other Ambulatory Visit: Payer: Self-pay | Admitting: Family Medicine

## 2024-02-24 ENCOUNTER — Other Ambulatory Visit: Payer: Self-pay | Admitting: Family Medicine

## 2024-02-25 ENCOUNTER — Other Ambulatory Visit: Payer: Self-pay | Admitting: Orthopaedic Surgery

## 2024-03-17 ENCOUNTER — Ambulatory Visit: Admitting: Family Medicine

## 2024-03-23 ENCOUNTER — Other Ambulatory Visit: Payer: Self-pay | Admitting: Family Medicine

## 2024-03-24 ENCOUNTER — Other Ambulatory Visit: Payer: Self-pay | Admitting: Orthopaedic Surgery

## 2024-04-14 ENCOUNTER — Other Ambulatory Visit: Payer: Self-pay

## 2024-04-14 ENCOUNTER — Ambulatory Visit: Payer: Self-pay

## 2024-04-14 ENCOUNTER — Ambulatory Visit: Payer: Self-pay | Admitting: *Deleted

## 2024-04-14 MED ORDER — SULFAMETHOXAZOLE-TRIMETHOPRIM 800-160 MG PO TABS: 1.0000 | ORAL_TABLET | Freq: Two times a day (BID) | ORAL | 0 refills | Status: DC

## 2024-04-14 NOTE — Telephone Encounter (Signed)
 Called patient to relay message, Patient thanks Dr.Tabori very much!

## 2024-04-14 NOTE — Telephone Encounter (Signed)
 Verbally called in rx for patient

## 2024-04-14 NOTE — Telephone Encounter (Signed)
 Copied from CRM #[id number]. Topic: Clinical - Red Word Triage >> Apr 14, 2024  2:35 PM [place] A wrote: [facility] that prompted transfer to Nurse Triage: Patient is at Pharmacy and prescription has not been sent. Reason for Disposition  [1] Caller has URGENT medicine question about med that primary care doctor (or NP/PA) or specialist prescribed AND [2] triager unable to answer question  Answer Assessment - Initial Assessment Questions 1. NAME of MEDICINE: What medicine(s) are you calling about?     sulfamethoxazole -trimethoprim  (BACTRIM  DS) 800-160 MG tablet 2. QUESTION: What is your question? (e.g., double dose of medicine, side effect)     Rx not at pharmacy 3. PRESCRIBER: Who prescribed the medicine? Reason: if prescribed by specialist, call should be referred to that group.     PCP Review of Rx shows it was marked phone in- no pharmacy is listed. Call to office- they will review for patient. Patient informed Rx will be reviewed by office- it may take some time.  [id number] [place], [place] - [address], [place] [address] Phone: [phone]  Fax: [phone]  Protocols used: Medication Question Call-A-AH

## 2024-04-14 NOTE — Telephone Encounter (Signed)
 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  Patient was last seen in primary care on 11/23/2023 by Mahlon Comer BRAVO, MD.  Called Nurse Triage reporting Otalgia.  Symptoms began yesterday.  Interventions attempted: Nothing.  Symptoms are: unchanged.  Triage Disposition: See Physician Within 24 Hours  Patient/caregiver understands and will follow disposition?: No, wishes to speak with PCP    Copied from CRM #[id number]. Topic: Clinical - Red Word Triage >> Apr 14, 2024 12:00 PM [place] E wrote: [facility] that prompted transfer to Nurse Triage: Bulging left ear drum, no pain, and also swollen left side lymph nodes. Reason for Disposition  Earache  (Exceptions: Brief ear pain of lasting less than 60 minutes, or earache occurring during air travel.)  Answer Assessment - Initial Assessment Questions 1. LOCATION: Which ear is involved?     Left ear drum and swollen lymph notes 2. ONSET: When did the ear pain start?      States bulging left ear drum, had dental work completed last week.  Was just seen yesterday for a physical and was told about the ear drum 3. SEVERITY: How bad is the pain?  (Scale 1-10; mild, moderate or severe)     States doesn't hurt 4. URI SYMPTOMS: Do you have a runny nose or cough?     no 5. FEVER: Do you have a fever? If Yes, ask: What is your temperature, how was it measured, and when did it start?     no 6. CAUSE: Have you been swimming recently?, How often do you use Q-TIPS?, Have you had any recent air travel or scuba diving?     no 7. OTHER SYMPTOMS: Do you have any other symptoms? (e.g., decreased hearing, dizziness, headache, stiff neck, vomiting)     denies 8. PREGNANCY: Is  there any chance you are pregnant? When was your last menstrual period?     na  Protocols used: [place]

## 2024-04-14 NOTE — Telephone Encounter (Signed)
 Due to pt's multiple medication allergies, prescription for Bactrim  sent to local pharmacy

## 2024-04-14 NOTE — Telephone Encounter (Signed)
 There are no appts available to schedule patient. Okay to send her to UC or are you okay with prescribing medication?

## 2024-04-14 NOTE — Telephone Encounter (Signed)
 I have resent medication to patients pharmacy. Patient aware

## 2024-04-20 ENCOUNTER — Other Ambulatory Visit: Payer: Self-pay | Admitting: Orthopaedic Surgery

## 2024-04-22 ENCOUNTER — Other Ambulatory Visit: Payer: Self-pay | Admitting: Family Medicine

## 2024-05-01 ENCOUNTER — Encounter: Payer: Self-pay | Admitting: Family Medicine

## 2024-05-01 ENCOUNTER — Ambulatory Visit: Admitting: Family Medicine

## 2024-05-01 VITALS — BP 128/70 | HR 80 | Temp 97.8°F | Ht 63.0 in | Wt 148.1 lb

## 2024-05-01 DIAGNOSIS — Z23 Encounter for immunization: Secondary | ICD-10-CM | POA: Diagnosis not present

## 2024-05-01 DIAGNOSIS — E119 Type 2 diabetes mellitus without complications: Secondary | ICD-10-CM | POA: Diagnosis not present

## 2024-05-01 DIAGNOSIS — E1169 Type 2 diabetes mellitus with other specified complication: Secondary | ICD-10-CM | POA: Diagnosis not present

## 2024-05-01 DIAGNOSIS — Z7984 Long term (current) use of oral hypoglycemic drugs: Secondary | ICD-10-CM | POA: Diagnosis not present

## 2024-05-01 DIAGNOSIS — E785 Hyperlipidemia, unspecified: Secondary | ICD-10-CM | POA: Diagnosis not present

## 2024-05-01 DIAGNOSIS — I10 Essential (primary) hypertension: Secondary | ICD-10-CM

## 2024-05-01 LAB — CBC WITH DIFFERENTIAL/PLATELET
Basophils Absolute: 0 K/uL (ref 0.0–0.1)
Basophils Relative: 0.7 % (ref 0.0–3.0)
Eosinophils Absolute: 0.2 K/uL (ref 0.0–0.7)
Eosinophils Relative: 2.7 % (ref 0.0–5.0)
HCT: 44.1 % (ref 36.0–46.0)
Hemoglobin: 14.6 g/dL (ref 12.0–15.0)
Lymphocytes Relative: 35.5 % (ref 12.0–46.0)
Lymphs Abs: 2.3 K/uL (ref 0.7–4.0)
MCHC: 33.1 g/dL (ref 30.0–36.0)
MCV: 86.8 fl (ref 78.0–100.0)
Monocytes Absolute: 0.5 K/uL (ref 0.1–1.0)
Monocytes Relative: 7 % (ref 3.0–12.0)
Neutro Abs: 3.5 K/uL (ref 1.4–7.7)
Neutrophils Relative %: 54.1 % (ref 43.0–77.0)
Platelets: 388 K/uL (ref 150.0–400.0)
RBC: 5.08 Mil/uL (ref 3.87–5.11)
RDW: 14.3 % (ref 11.5–15.5)
WBC: 6.5 K/uL (ref 4.0–10.5)

## 2024-05-01 LAB — HEPATIC FUNCTION PANEL
ALT: 15 U/L (ref 0–35)
AST: 17 U/L (ref 0–37)
Albumin: 4.5 g/dL (ref 3.5–5.2)
Alkaline Phosphatase: 52 U/L (ref 39–117)
Bilirubin, Direct: 0.1 mg/dL (ref 0.0–0.3)
Total Bilirubin: 0.4 mg/dL (ref 0.2–1.2)
Total Protein: 7.1 g/dL (ref 6.0–8.3)

## 2024-05-01 LAB — BASIC METABOLIC PANEL WITH GFR
BUN: 17 mg/dL (ref 6–23)
CO2: 25 meq/L (ref 19–32)
Calcium: 10 mg/dL (ref 8.4–10.5)
Chloride: 105 meq/L (ref 96–112)
Creatinine, Ser: 0.68 mg/dL (ref 0.40–1.20)
GFR: 97.43 mL/min (ref >60.00)
Glucose, Bld: 81 mg/dL (ref 70–99)
Potassium: 3.8 meq/L (ref 3.5–5.1)
Sodium: 143 meq/L (ref 135–145)

## 2024-05-01 LAB — LIPID PANEL
Cholesterol: 107 mg/dL (ref 0–200)
HDL: 38.4 mg/dL — ABNORMAL LOW (ref >39.00)
LDL Cholesterol: 55 mg/dL (ref 0–99)
NonHDL: 68.68
Total CHOL/HDL Ratio: 3
Triglycerides: 70 mg/dL (ref 0.0–149.0)
VLDL: 14 mg/dL (ref 0.0–40.0)

## 2024-05-01 LAB — TSH: TSH: 1.3 u[IU]/mL (ref 0.35–5.50)

## 2024-05-01 LAB — HEMOGLOBIN A1C: Hgb A1c MFr Bld: 6.2 % (ref 4.6–6.5)

## 2024-05-01 NOTE — Patient Instructions (Signed)
 Schedule your complete physical in 6 months We'll notify you of your lab results and make any changes if needed Keep up the good work on healthy diet and regular exercise- you look incredible!!! Call with any questions or concerns Stay Safe!  Stay Healthy! Happy Fall!!

## 2024-05-01 NOTE — Assessment & Plan Note (Signed)
 Chronic problem.  On Lisinopril  10mg  daily w/ good control.  Currently asymptomatic.  Check labs due to ACE use but no anticipated med changes.

## 2024-05-01 NOTE — Progress Notes (Signed)
   Subjective:    Patient ID: Jamie Hall, female    DOB: [birth year], 58 y.o.   MRN: [id number]  HPI DM- chronic problem, on Jardiance  10mg  daily, Metformin  500mg  BID.  Last A1C 6.1%  Down 30 lbs since April- walking regularly.  On ACE for renal protection.  UTD on microalbumin, eye exam, foot exam.  Denies symptomatic lows.  No numbness/tingling of hands/feet.  Hyperlipidemia- chronic problem, on Lipitor 10mg  daily.  Down 30 lbs since last visit.  No abd pain, N/V.  HTN- chronic problem, on Lisinopril  10mg  daily w/ good control.  No CP, SOB, HA's, visual changes, edema.   Review of Systems For ROS see HPI     Objective:   Physical Exam Vitals reviewed.  Constitutional:      General: She is not in acute distress.    Appearance: Normal appearance. She is well-developed. She is not ill-appearing.  HENT:     Head: Normocephalic and atraumatic.  Eyes:     Conjunctiva/sclera: Conjunctivae normal.     Pupils: Pupils are equal, round, and reactive to light.  Neck:     Thyroid : No thyromegaly.  Cardiovascular:     Rate and Rhythm: Normal rate and regular rhythm.     Pulses: Normal pulses.     Heart sounds: Normal heart sounds. No murmur heard. Pulmonary:     Effort: Pulmonary effort is normal. No respiratory distress.     Breath sounds: Normal breath sounds.  Abdominal:     General: There is no distension.     Palpations: Abdomen is soft.     Tenderness: There is no abdominal tenderness.  Musculoskeletal:     Cervical back: Normal range of motion and neck supple.     Right lower leg: No edema.     Left lower leg: No edema.  Lymphadenopathy:     Cervical: No cervical adenopathy.  Skin:    General: Skin is warm and dry.  Neurological:     General: No focal deficit present.     Mental Status: She is alert and oriented to person, place, and time.  Psychiatric:        Mood and Affect: Mood normal.        Behavior: Behavior normal.        Thought Content: Thought content  normal.           Assessment & Plan:

## 2024-05-01 NOTE — Assessment & Plan Note (Signed)
 Chronic problem.  On Lipitor 10mg  daily w/o difficulty.  Check labs.  Adjust meds prn

## 2024-05-01 NOTE — Assessment & Plan Note (Signed)
 Chronic problem.  Currently on Jardiance  and Metformin  w/o difficulty and hx of good control.  On ACE for renal protection.  UTD on microalbumin, eye exam, foot exam.  Is down another 30 lbs just since April!!  Applauded her efforts, congratulated her, and encouraged her to continue.  Check labs.  Adjust meds prn

## 2024-05-02 ENCOUNTER — Ambulatory Visit: Payer: Self-pay | Admitting: Family Medicine

## 2024-05-02 DIAGNOSIS — M4316 Spondylolisthesis, lumbar region: Secondary | ICD-10-CM | POA: Diagnosis not present

## 2024-05-02 NOTE — Progress Notes (Signed)
 Pt has been diagnosed

## 2024-05-02 NOTE — Progress Notes (Signed)
 Error. Pt has been notified of labs

## 2024-05-04 ENCOUNTER — Ambulatory Visit: Admitting: Family Medicine

## 2024-05-07 ENCOUNTER — Other Ambulatory Visit: Payer: Self-pay | Admitting: Family Medicine

## 2024-05-18 ENCOUNTER — Other Ambulatory Visit: Payer: Self-pay | Admitting: Family Medicine

## 2024-05-19 ENCOUNTER — Other Ambulatory Visit: Payer: Self-pay | Admitting: Orthopaedic Surgery

## 2024-05-29 ENCOUNTER — Encounter: Payer: Self-pay | Admitting: Radiology

## 2024-06-12 ENCOUNTER — Other Ambulatory Visit: Payer: Self-pay | Admitting: Orthopaedic Surgery

## 2024-06-12 ENCOUNTER — Telehealth: Payer: Self-pay

## 2024-06-12 MED ORDER — MELOXICAM 15 MG PO TABS: 15.0000 mg | ORAL_TABLET | Freq: Every day | ORAL | 6 refills | Status: AC | PRN

## 2024-06-12 NOTE — Telephone Encounter (Signed)
 I got a refill request for Meloxicam  for this patient but she hasn't been seen since 2023

## 2024-08-09 ENCOUNTER — Ambulatory Visit: Payer: Self-pay

## 2024-08-09 ENCOUNTER — Other Ambulatory Visit: Payer: Self-pay | Admitting: Family Medicine

## 2024-08-09 MED ORDER — DEXCOM G6 TRANSMITTER MISC: 0 refills | Status: AC

## 2024-08-09 MED ORDER — DEXCOM G6 SENSOR MISC: 1 refills | Status: AC

## 2024-08-09 MED ORDER — DEXCOM G6 RECEIVER DEVI: 0 refills | Status: AC

## 2024-08-09 NOTE — Telephone Encounter (Signed)
 Patient is coming in to see you Friday at 1pm.

## 2024-08-09 NOTE — Telephone Encounter (Signed)
 Copied from CRM [id number]. Topic: Clinical - Medication Refill >> Aug 09, 2024  1:20 PM [place] E wrote: Medication: Continuous Glucose Sensor (DEXCOM G6 SENSOR) MISC Continuous Glucose Transmitter (DEXCOM G6 TRANSMITTER) MISC  Continuous Blood Gluc Receiver (DEXCOM G6 RECEIVER) DEVI  Has the patient contacted their pharmacy? Yes (Agent: If no, request that the patient contact the pharmacy for the refill. If patient does not wish to contact the pharmacy document the reason why and proceed with request.) (Agent: If yes, when and what did the pharmacy advise?)  This is the patient's preferred pharmacy:  [id number] [place], [place] - [address] [place] [address] Phone: [phone] Fax: [phone]   Is this the correct pharmacy for this prescription? Yes If no, delete pharmacy and type the correct one.   Has the prescription been filled recently? No  Is the patient out of the medication? Yes  Has the patient been seen for an appointment in the last year OR does the patient have an upcoming appointment? Yes  Can we respond through MyChart? Yes  Agent: Please be advised that Rx refills may take up to 3 business days. We ask that you follow-up with your pharmacy.

## 2024-08-09 NOTE — Addendum Note (Signed)
 Addended by: GEORGINA GAUZE on: 08/09/2024 01:40 PM   Modules accepted: Orders

## 2024-08-09 NOTE — Telephone Encounter (Signed)
 FYI Only or Action Required?: Action required by provider: request for appointment, update on patient condition, and uncertain patient will go to Urgent Care.  Patient was last seen in primary care on 05/01/2024 by Mahlon Comer BRAVO, MD.  Called Nurse Triage reporting Dysuria.  Symptoms began 10 days ago.  Interventions attempted: OTC medications: Pyridium  and Rest, hydration, or home remedies.  Symptoms are: stable.  Triage Disposition: See Physician Within 24 Hours  Patient/caregiver understands and will follow disposition?: Unsure    Copied from CRM [id number]. Topic: Clinical - Red Word Triage >> Aug 09, 2024  1:21 PM [place] E wrote: [facility] that prompted transfer to Nurse Triage: UTI. Patient has a burning sensation when voiding, urgency to go, and some pain. Going on for 10 days.     Reason for Disposition  Age > 50 years  Additional Information  Negative: Diabetes mellitus or weak immune system (e.g., HIV positive, cancer chemo, splenectomy, organ transplant, chronic steroids)    No history of weakened immune system however does have Diabetes. Takes Metformin , Jardiance ;  DexCom- sugars today 90s-120s (stable).  Answer Assessment - Initial Assessment Questions See alternative assessment.  Answer Assessment - Initial Assessment Questions 1. SYMPTOM: What's the main symptom you're concerned about? (e.g., frequency, incontinence)     Pain (burning with voiding), frequency (every 2 hours at night), urgency  2. ONSET: When did the  urinary symptoms  start?     10 days ago  3. PAIN: Is there any pain? If Yes, ask: How bad is it? (Scale: 1-10; mild, moderate, severe)     Burning sensation- 7/10 without OTC medication, OTC meds help  4. CAUSE: What do you think is causing the symptoms?     Last UTI over a year ago or longer.   5. OTHER SYMPTOMS: Do you have any other symptoms? (e.g., blood in urine, fever, flank pain, pain with urination)     Urgency,  frequency, pain  Still has some pyridium  from kidney stone 2021 and possibly had another UTI since then.   No back or flank pain.  Cloudy urine at times, no foul odor, no blood.  6. PREGNANCY: Is there any chance you are pregnant? When was your last menstrual period?     Denies, LMP 20 years ago  Protocols used: Urinary Symptoms-A-AH, Urination Pain - Female-A-AH

## 2024-08-11 ENCOUNTER — Ambulatory Visit: Admitting: Family Medicine

## 2024-08-11 ENCOUNTER — Encounter: Payer: Self-pay | Admitting: Family Medicine

## 2024-08-11 VITALS — BP 112/74 | HR 77 | Temp 98.0°F | Ht 63.0 in | Wt 144.0 lb

## 2024-08-11 DIAGNOSIS — R300 Dysuria: Secondary | ICD-10-CM | POA: Diagnosis not present

## 2024-08-11 LAB — POC URINALSYSI DIPSTICK (AUTOMATED)
Bilirubin, UA: NEGATIVE
Glucose, UA: POSITIVE — AB
Ketones, UA: NEGATIVE
Leukocytes, UA: NEGATIVE
Nitrite, UA: NEGATIVE
Protein, UA: POSITIVE — AB
Spec Grav, UA: 1.02
Urobilinogen, UA: 0.2 U/dL
pH, UA: 6

## 2024-08-11 MED ORDER — SULFAMETHOXAZOLE-TRIMETHOPRIM 800-160 MG PO TABS: 1.0000 | ORAL_TABLET | Freq: Two times a day (BID) | ORAL | 0 refills | Status: AC

## 2024-08-11 NOTE — Patient Instructions (Addendum)
 Follow up as needed or as scheduled START the Bactrim  twice daily Drink LOTS of fluids Call with any questions or concerns Stay Safe!  Stay Healthy! CONGRATS ON THE NEW JOB!!!

## 2024-08-11 NOTE — Progress Notes (Signed)
" ° °  Subjective:    Patient ID: Jamie Hall, female    DOB: [birth year], 59 y.o.   MRN: [id number]  HPI Dysuria- pt reports sxs started ~10 days ago w/ burning, pressure, frequency, urgency.  Denies N/V, blood in urine, fever/chills.  Using AZO for symptom relief.     Review of Systems For ROS see HPI     Objective:   Physical Exam Vitals reviewed.  Constitutional:      General: She is not in acute distress.    Appearance: Normal appearance. She is well-developed. She is not ill-appearing.  Abdominal:     General: There is no distension.     Palpations: Abdomen is soft.     Tenderness: There is no abdominal tenderness (no suprapubic or CVA tenderness).  Skin:    General: Skin is warm and dry.  Neurological:     General: No focal deficit present.     Mental Status: She is alert and oriented to person, place, and time.  Psychiatric:        Mood and Affect: Mood normal.        Behavior: Behavior normal.        Thought Content: Thought content normal.           Assessment & Plan:  Dysuria- new.  Pt's sxs are consistent w/ infxn.  UA w/ blood.  Start Bactrim  and await cx results.  Pt expressed understanding and is in agreement w/ plan.   "

## 2024-08-13 LAB — URINE CULTURE
MICRO NUMBER:: 17479540
SPECIMEN QUALITY:: ADEQUATE

## 2024-08-14 ENCOUNTER — Ambulatory Visit: Payer: Self-pay | Admitting: Family Medicine

## 2024-08-20 ENCOUNTER — Other Ambulatory Visit: Payer: Self-pay | Admitting: Family Medicine

## 2024-11-03 ENCOUNTER — Encounter: Admitting: Family Medicine
